# Patient Record
Sex: Male | Born: 1960 | Race: White | Hispanic: No | Marital: Married | Smoking: Current every day smoker
Health system: Southern US, Community
[De-identification: ages and names within clinical notes are randomized; demographics above are authoritative.]

## PROBLEM LIST (undated history)

## (undated) HISTORY — PX: OTHER SURGICAL HISTORY: SHX169

---

## 2016-05-06 ENCOUNTER — Ambulatory Visit
Admission: RE | Admit: 2016-05-06 | Discharge: 2016-05-06 | Disposition: A | Discharge status: Home / Self Care | Payer: BLUE CROSS/BLUE SHIELD | Source: Ambulatory Visit | Attending: Orthopedic Surgery | Admitting: Orthopedic Surgery

## 2016-05-06 ENCOUNTER — Other Ambulatory Visit: Payer: Self-pay | Admitting: Orthopedic Surgery

## 2016-05-06 DIAGNOSIS — S22089A Unspecified fracture of T11-T12 vertebra, initial encounter for closed fracture: Secondary | ICD-10-CM | POA: Diagnosis not present

## 2016-05-06 DIAGNOSIS — S22080A Wedge compression fracture of T11-T12 vertebra, initial encounter for closed fracture: Secondary | ICD-10-CM

## 2016-05-06 DIAGNOSIS — X58XXXA Exposure to other specified factors, initial encounter: Secondary | ICD-10-CM | POA: Insufficient documentation

## 2016-05-06 DIAGNOSIS — S32029A Unspecified fracture of second lumbar vertebra, initial encounter for closed fracture: Secondary | ICD-10-CM | POA: Diagnosis not present

## 2016-05-06 DIAGNOSIS — M5124 Other intervertebral disc displacement, thoracic region: Secondary | ICD-10-CM | POA: Insufficient documentation

## 2016-05-06 DIAGNOSIS — M549 Dorsalgia, unspecified: Secondary | ICD-10-CM | POA: Diagnosis present
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
## 2022-05-31 ENCOUNTER — Emergency Department: Payer: 59

## 2022-05-31 ENCOUNTER — Other Ambulatory Visit: Payer: Self-pay

## 2022-05-31 ENCOUNTER — Inpatient Hospital Stay: Payer: 59

## 2022-05-31 ENCOUNTER — Inpatient Hospital Stay
Admission: EM | Admit: 2022-05-31 | Discharge: 2022-06-03 | DRG: 603 | Disposition: A | Discharge status: Home Health | Payer: 59 | Attending: Student in an Organized Health Care Education/Training Program | Admitting: Student in an Organized Health Care Education/Training Program

## 2022-05-31 ENCOUNTER — Encounter: Payer: Self-pay | Admitting: Emergency Medicine

## 2022-05-31 DIAGNOSIS — E11621 Type 2 diabetes mellitus with foot ulcer: Secondary | ICD-10-CM | POA: Diagnosis present

## 2022-05-31 DIAGNOSIS — E1165 Type 2 diabetes mellitus with hyperglycemia: Secondary | ICD-10-CM | POA: Diagnosis present

## 2022-05-31 DIAGNOSIS — E118 Type 2 diabetes mellitus with unspecified complications: Secondary | ICD-10-CM

## 2022-05-31 DIAGNOSIS — L03115 Cellulitis of right lower limb: Secondary | ICD-10-CM | POA: Diagnosis present

## 2022-05-31 DIAGNOSIS — R21 Rash and other nonspecific skin eruption: Secondary | ICD-10-CM | POA: Diagnosis not present

## 2022-05-31 DIAGNOSIS — R233 Spontaneous ecchymoses: Secondary | ICD-10-CM | POA: Diagnosis not present

## 2022-05-31 DIAGNOSIS — I83013 Varicose veins of right lower extremity with ulcer of ankle: Secondary | ICD-10-CM | POA: Diagnosis present

## 2022-05-31 DIAGNOSIS — L97929 Non-pressure chronic ulcer of unspecified part of left lower leg with unspecified severity: Secondary | ICD-10-CM | POA: Diagnosis present

## 2022-05-31 DIAGNOSIS — I889 Nonspecific lymphadenitis, unspecified: Secondary | ICD-10-CM | POA: Diagnosis present

## 2022-05-31 DIAGNOSIS — I8392 Asymptomatic varicose veins of left lower extremity: Secondary | ICD-10-CM | POA: Diagnosis present

## 2022-05-31 DIAGNOSIS — Z23 Encounter for immunization: Secondary | ICD-10-CM

## 2022-05-31 DIAGNOSIS — L97319 Non-pressure chronic ulcer of right ankle with unspecified severity: Secondary | ICD-10-CM | POA: Diagnosis not present

## 2022-05-31 DIAGNOSIS — L97921 Non-pressure chronic ulcer of unspecified part of left lower leg limited to breakdown of skin: Secondary | ICD-10-CM | POA: Insufficient documentation

## 2022-05-31 DIAGNOSIS — I878 Other specified disorders of veins: Secondary | ICD-10-CM | POA: Diagnosis present

## 2022-05-31 DIAGNOSIS — G8929 Other chronic pain: Secondary | ICD-10-CM | POA: Diagnosis not present

## 2022-05-31 DIAGNOSIS — L299 Pruritus, unspecified: Secondary | ICD-10-CM | POA: Diagnosis present

## 2022-05-31 DIAGNOSIS — L089 Local infection of the skin and subcutaneous tissue, unspecified: Secondary | ICD-10-CM | POA: Diagnosis not present

## 2022-05-31 DIAGNOSIS — F17210 Nicotine dependence, cigarettes, uncomplicated: Secondary | ICD-10-CM | POA: Diagnosis present

## 2022-05-31 DIAGNOSIS — L0390 Cellulitis, unspecified: Secondary | ICD-10-CM | POA: Diagnosis not present

## 2022-05-31 DIAGNOSIS — L03116 Cellulitis of left lower limb: Secondary | ICD-10-CM | POA: Diagnosis not present

## 2022-05-31 DIAGNOSIS — M7989 Other specified soft tissue disorders: Secondary | ICD-10-CM | POA: Diagnosis not present

## 2022-05-31 DIAGNOSIS — R739 Hyperglycemia, unspecified: Secondary | ICD-10-CM | POA: Diagnosis not present

## 2022-05-31 DIAGNOSIS — L97311 Non-pressure chronic ulcer of right ankle limited to breakdown of skin: Secondary | ICD-10-CM | POA: Diagnosis not present

## 2022-05-31 DIAGNOSIS — I83002 Varicose veins of unspecified lower extremity with ulcer of calf: Secondary | ICD-10-CM

## 2022-05-31 DIAGNOSIS — E1169 Type 2 diabetes mellitus with other specified complication: Secondary | ICD-10-CM

## 2022-05-31 DIAGNOSIS — I776 Arteritis, unspecified: Secondary | ICD-10-CM | POA: Diagnosis not present

## 2022-05-31 DIAGNOSIS — R000 Tachycardia, unspecified: Secondary | ICD-10-CM | POA: Diagnosis present

## 2022-05-31 LAB — LACTIC ACID, PLASMA
Lactic Acid, Venous: 1.7 mmol/L (ref 0.5–1.9)
Lactic Acid, Venous: 2.3 mmol/L (ref 0.5–1.9)

## 2022-05-31 LAB — CBG MONITORING, ED: Glucose-Capillary: 114 mg/dL — ABNORMAL HIGH (ref 70–99)

## 2022-05-31 LAB — CBC WITH DIFFERENTIAL/PLATELET
Abs Immature Granulocytes: 0.01 10*3/uL (ref 0.00–0.07)
Basophils Absolute: 0.1 10*3/uL (ref 0.0–0.1)
Basophils Relative: 1 %
Eosinophils Absolute: 0.4 10*3/uL (ref 0.0–0.5)
Eosinophils Relative: 6 %
HCT: 34.3 % — ABNORMAL LOW (ref 39.0–52.0)
Hemoglobin: 11.1 g/dL — ABNORMAL LOW (ref 13.0–17.0)
Immature Granulocytes: 0 %
Lymphocytes Relative: 14 %
Lymphs Abs: 1 10*3/uL (ref 0.7–4.0)
MCH: 29.1 pg (ref 26.0–34.0)
MCHC: 32.4 g/dL (ref 30.0–36.0)
MCV: 89.8 fL (ref 80.0–100.0)
Monocytes Absolute: 0.7 10*3/uL (ref 0.1–1.0)
Monocytes Relative: 11 %
Neutro Abs: 4.7 10*3/uL (ref 1.7–7.7)
Neutrophils Relative %: 68 %
Platelets: 259 10*3/uL (ref 150–400)
RBC: 3.82 MIL/uL — ABNORMAL LOW (ref 4.22–5.81)
RDW: 13.3 % (ref 11.5–15.5)
WBC: 6.9 10*3/uL (ref 4.0–10.5)
nRBC: 0 % (ref 0.0–0.2)

## 2022-05-31 LAB — SURGICAL PCR SCREEN
MRSA, PCR: NEGATIVE
Staphylococcus aureus: POSITIVE — AB

## 2022-05-31 LAB — COMPREHENSIVE METABOLIC PANEL
ALT: 21 U/L (ref 0–44)
AST: 30 U/L (ref 15–41)
Albumin: 3.2 g/dL — ABNORMAL LOW (ref 3.5–5.0)
Alkaline Phosphatase: 116 U/L (ref 38–126)
Anion gap: 8 (ref 5–15)
BUN: 14 mg/dL (ref 6–20)
CO2: 26 mmol/L (ref 22–32)
Calcium: 8.4 mg/dL — ABNORMAL LOW (ref 8.9–10.3)
Chloride: 101 mmol/L (ref 98–111)
Creatinine, Ser: 0.74 mg/dL (ref 0.61–1.24)
GFR, Estimated: >60 mL/min (ref >60)
Glucose, Bld: 210 mg/dL — ABNORMAL HIGH (ref 70–99)
Potassium: 3.7 mmol/L (ref 3.5–5.1)
Sodium: 135 mmol/L (ref 135–145)
Total Bilirubin: 0.6 mg/dL (ref 0.3–1.2)
Total Protein: 8 g/dL (ref 6.5–8.1)

## 2022-05-31 LAB — PROTIME-INR
INR: 1.1 (ref 0.8–1.2)
Prothrombin Time: 14.5 seconds (ref 11.4–15.2)

## 2022-05-31 LAB — CBC
HCT: 36.2 % — ABNORMAL LOW (ref 39.0–52.0)
Hemoglobin: 11.6 g/dL — ABNORMAL LOW (ref 13.0–17.0)
MCH: 29.1 pg (ref 26.0–34.0)
MCHC: 32 g/dL (ref 30.0–36.0)
MCV: 91 fL (ref 80.0–100.0)
Platelets: 241 10*3/uL (ref 150–400)
RBC: 3.98 MIL/uL — ABNORMAL LOW (ref 4.22–5.81)
RDW: 13.5 % (ref 11.5–15.5)
WBC: 6 10*3/uL (ref 4.0–10.5)
nRBC: 0 % (ref 0.0–0.2)

## 2022-05-31 LAB — CREATININE, SERUM
Creatinine, Ser: 0.72 mg/dL (ref 0.61–1.24)
GFR, Estimated: >60 mL/min (ref >60)

## 2022-05-31 LAB — APTT: aPTT: 30 seconds (ref 24–36)

## 2022-05-31 MED ORDER — SODIUM CHLORIDE 0.9 % IV SOLN
2.0000 g | Freq: Once | INTRAVENOUS | Status: AC
  Administered 2022-05-31: 2 g via INTRAVENOUS
  Filled 2022-05-31: qty 12.5

## 2022-05-31 MED ORDER — WHITE PETROLATUM EX OINT: TOPICAL_OINTMENT | CUTANEOUS | Status: DC | PRN

## 2022-05-31 MED ORDER — WHITE PETROLATUM EX OINT: 1.0000 | TOPICAL_OINTMENT | Freq: Every day | CUTANEOUS | Status: DC

## 2022-05-31 MED ORDER — HYDROCODONE-ACETAMINOPHEN 5-325 MG PO TABS
2.0000 | ORAL_TABLET | ORAL | Status: DC | PRN
  Administered 2022-06-01 – 2022-06-02 (×2): 2 via ORAL
  Filled 2022-05-31 (×5): qty 2

## 2022-05-31 MED ORDER — INSULIN ASPART 100 UNIT/ML IJ SOLN
0.0000 [IU] | Freq: Three times a day (TID) | INTRAMUSCULAR | Status: DC
  Administered 2022-06-01 – 2022-06-02 (×3): 1 [IU] via SUBCUTANEOUS
  Administered 2022-06-02: 2 [IU] via SUBCUTANEOUS
  Administered 2022-06-03: 3 [IU] via SUBCUTANEOUS
  Filled 2022-05-31 (×5): qty 1

## 2022-05-31 MED ORDER — MORPHINE SULFATE (PF) 4 MG/ML IV SOLN
4.0000 mg | Freq: Once | INTRAVENOUS | Status: AC
  Administered 2022-05-31: 4 mg via INTRAVENOUS
  Filled 2022-05-31: qty 1

## 2022-05-31 MED ORDER — INSULIN ASPART 100 UNIT/ML IJ SOLN: 0.0000 [IU] | Freq: Every day | INTRAMUSCULAR | Status: DC

## 2022-05-31 MED ORDER — VANCOMYCIN HCL 1500 MG/300ML IV SOLN
1500.0000 mg | Freq: Once | INTRAVENOUS | Status: AC
  Administered 2022-05-31: 1500 mg via INTRAVENOUS
  Filled 2022-05-31: qty 300

## 2022-05-31 MED ORDER — MORPHINE SULFATE (PF) 4 MG/ML IV SOLN: 4.0000 mg | INTRAVENOUS | Status: DC | PRN

## 2022-05-31 MED ORDER — ACETAMINOPHEN 325 MG PO TABS: 650.0000 mg | ORAL_TABLET | ORAL | Status: DC | PRN

## 2022-05-31 MED ORDER — VANCOMYCIN HCL 1250 MG/250ML IV SOLN
1250.0000 mg | Freq: Two times a day (BID) | INTRAVENOUS | Status: DC
  Administered 2022-06-01 (×2): 1250 mg via INTRAVENOUS
  Filled 2022-05-31 (×3): qty 250

## 2022-05-31 MED ORDER — ENOXAPARIN SODIUM 40 MG/0.4ML IJ SOSY
40.0000 mg | PREFILLED_SYRINGE | INTRAMUSCULAR | Status: DC
  Administered 2022-05-31 – 2022-06-02 (×3): 40 mg via SUBCUTANEOUS
  Filled 2022-05-31 (×3): qty 0.4

## 2022-05-31 MED ORDER — WHITE PETROLATUM EX OINT
1.0000 | TOPICAL_OINTMENT | Freq: Every day | CUTANEOUS | Status: DC
  Administered 2022-06-01 – 2022-06-03 (×3): 1 via TOPICAL
  Filled 2022-05-31: qty 20
  Filled 2022-05-31 (×4): qty 5

## 2022-05-31 MED ORDER — CHLORHEXIDINE GLUCONATE CLOTH 2 % EX PADS
6.0000 | MEDICATED_PAD | Freq: Every day | CUTANEOUS | Status: DC
  Administered 2022-06-01 – 2022-06-03 (×3): 6 via TOPICAL

## 2022-05-31 MED ORDER — MUPIROCIN 2 % EX OINT
1.0000 | TOPICAL_OINTMENT | Freq: Two times a day (BID) | CUTANEOUS | Status: DC
  Administered 2022-05-31 – 2022-06-03 (×6): 1 via NASAL
  Filled 2022-05-31: qty 22

## 2022-05-31 MED ORDER — PIPERACILLIN-TAZOBACTAM 3.375 G IVPB
3.3750 g | Freq: Three times a day (TID) | INTRAVENOUS | Status: DC
  Administered 2022-05-31 – 2022-06-02 (×5): 3.375 g via INTRAVENOUS
  Filled 2022-05-31 (×5): qty 50

## 2022-05-31 MED ORDER — TETANUS-DIPHTH-ACELL PERTUSSIS 5-2.5-18.5 LF-MCG/0.5 IM SUSY
0.5000 mL | PREFILLED_SYRINGE | Freq: Once | INTRAMUSCULAR | Status: AC
  Administered 2022-05-31: 0.5 mL via INTRAMUSCULAR
  Filled 2022-05-31: qty 0.5

## 2022-05-31 MED ORDER — SODIUM CHLORIDE 0.9% FLUSH
3.0000 mL | Freq: Two times a day (BID) | INTRAVENOUS | Status: DC
  Administered 2022-05-31 – 2022-06-03 (×5): 3 mL via INTRAVENOUS

## 2022-05-31 MED ORDER — LACTATED RINGERS IV BOLUS
1000.0000 mL | Freq: Once | INTRAVENOUS | Status: AC
  Administered 2022-05-31: 1000 mL via INTRAVENOUS

## 2022-05-31 MED ORDER — WHITE PETROLATUM EX OINT
1.0000 | TOPICAL_OINTMENT | Freq: Every day | CUTANEOUS | Status: DC
  Filled 2022-05-31: qty 5

## 2022-05-31 MED ORDER — POLYETHYLENE GLYCOL 3350 17 G PO PACK
17.0000 g | PACK | Freq: Every day | ORAL | Status: DC
  Administered 2022-05-31 – 2022-06-03 (×4): 17 g via ORAL
  Filled 2022-05-31 (×4): qty 1

## 2022-05-31 NOTE — ED Triage Notes (Signed)
Pt reports bilateral leg wounds that he has been dealing with since the beginning of the year. Left leg worse. Pt states it started as a small spot and his fingernail scratched it. Leg swollen and red.

## 2022-05-31 NOTE — Assessment & Plan Note (Signed)
Involving the left lower extremity associated with some purulence up to the left tibia from the ankle area with some involvement of the left foot dorsum.  Given association with exposure to dog saliva, patient will need both vancomycin and Zosyn.  Monitor clinically.

## 2022-05-31 NOTE — Assessment & Plan Note (Signed)
Likely due to above monitor

## 2022-05-31 NOTE — Assessment & Plan Note (Signed)
Macular rash, not associated with any eosinophilia.  Associated with some itching.  I will check acute hepatitis panel to try to rule out hepatitis C as well as hepatitis B.  I will check HIV.  Monitor

## 2022-05-31 NOTE — Assessment & Plan Note (Signed)
Patient reports ulcer present for about 6 weeks, will need x-ray to make sure there is no bony erosion underlying this.  I have requested wound care evaluation, for tonight cover with Vaseline gauze and secure with Kerlix.  Does not appear to be infected at this time

## 2022-05-31 NOTE — ED Provider Notes (Signed)
[facility] Provider Note    Event Date/Time   First MD Initiated Contact with Patient 05/31/22 1800     (approximate)   History   leg infection   HPI  [name] is a 62 y.o. male reports no major past medical history no allergies takes no medications ago occasional ibuprofen  He had a spot that he reports started like a varicose vein that popped on his left lower leg in January and progressively worsened with increasing redness swelling over his leg.  Reports that actually worse looked worse yesterday.  Wife encouraged him to come to the ER to have this looked at as it has been festering for months  Reports pain in his left leg ibuprofen "does not really help much".  Denies history of diabetes.  No fevers.  Reports the left leg does ache there is been no injury     Physical Exam   Triage Vital Signs: ED Triage Vitals  Enc Vitals Group     BP 05/31/22 1717 (!) 161/95     Pulse Rate 05/31/22 1717 (!) 125     Resp 05/31/22 1717 18     Temp 05/31/22 1717 98.1 F (36.7 C)     Temp Source 05/31/22 1717 Oral     SpO2 05/31/22 1717 96 %     Weight 05/31/22 1718 185 lb (83.9 kg)     Height 05/31/22 1718 '5\' 9"'$  (1.753 m)     Head Circumference --      Peak Flow --      Pain Score 05/31/22 1717 2     Pain Loc --      Pain Edu? --      Excl. in Trail? --     Most recent vital signs: Vitals:   05/31/22 1717 05/31/22 1907  BP: (!) 161/95 139/87  Pulse: (!) 125 (!) 103  Resp: 18   Temp: 98.1 F (36.7 C)   SpO2: 96% 100%     General: Awake, no distress.  CV:  Good peripheral perfusion.  Palpable dorsalis pedis pulse left foot warm and well-perfused toes left foot Resp:  Normal effort.  Abd:  No distention.  Other:  See clinical media uploaded.  Right lower extremity no noted lesion or injury.  Left lower extremity with significant cobblestoning, some ulceration and drainage erythema primarily involving the left calf medial posterior surfaces with  surrounding erythema and slight edema.  Warm to touch.   ED Results / Procedures / Treatments   Labs (all labs ordered are listed, but only abnormal results are displayed) Labs Reviewed  COMPREHENSIVE METABOLIC PANEL - Abnormal; Notable for the following components:      Result Value   Glucose, Bld 210 (*)    Calcium 8.4 (*)    Albumin 3.2 (*)    All other components within normal limits  CBC WITH DIFFERENTIAL/PLATELET - Abnormal; Notable for the following components:   RBC 3.82 (*)    Hemoglobin 11.1 (*)    HCT 34.3 (*)    All other components within normal limits  CULTURE, BLOOD (ROUTINE X 2)  CULTURE, BLOOD (ROUTINE X 2)  SURGICAL PCR SCREEN  LACTIC ACID, PLASMA  LACTIC ACID, PLASMA     EKG     RADIOLOGY  Personally interpreted the patient's left tib-fib x-ray is negative for fracture, no obvious gas formation  US Venous Img Lower Unilateral Left  Result Date: 05/31/2022 CLINICAL DATA:  Left leg swelling. EXAM: LEFT LOWER EXTREMITY VENOUS  DOPPLER ULTRASOUND TECHNIQUE: Gray-scale sonography with compression, as well as color and duplex ultrasound, were performed to evaluate the deep venous system(s) from the level of the common femoral vein through the popliteal and proximal calf veins. COMPARISON:  None Available. FINDINGS: VENOUS Normal compressibility of the common femoral, superficial femoral, and popliteal veins, as well as the visualized calf veins. Visualized portions of profunda femoral vein and great saphenous vein unremarkable. No filling defects to suggest DVT on grayscale or color Doppler imaging. Doppler waveforms show normal direction of venous flow, normal respiratory plasticity and response to augmentation. Limited views of the contralateral common femoral vein are unremarkable. OTHER Bilateral inguinal lymph nodes, the larger on the left measuring 4.4 x 1.5 x 3.8 cm and on the right measuring 2.5 x 1.1 x 1.3 cm. Limitations: none IMPRESSION: 1. No lower  extremity DVT. 2. Bilateral inguinal lymph nodes, the larger on the left measuring 4.4 x 1.5 x 3.8 cm and on the right measuring 2.5 x 1.1 x 1.3 cm. Negative. Electronically Signed   By: Keane Police D.O.   On: 05/31/2022 20:01   DG Tibia/Fibula Left  Result Date: 05/31/2022 CLINICAL DATA:  Left leg infection EXAM: LEFT TIBIA AND FIBULA - 2 VIEW COMPARISON:  None Available. FINDINGS: There is no evidence of fracture or other focal bone lesions. No bone destruction. Soft tissues are unremarkable. No radiopaque foreign body. IMPRESSION: Negative. Electronically Signed   By: Rolm Baptise M.D.   On: 05/31/2022 19:22    Left lower extremity negative for acute DVT.  Bilateral large lymph nodes are noted  Left tib-fib x-ray negative  PROCEDURES:  Critical Care performed: No  Procedures   MEDICATIONS ORDERED IN ED: Medications  lactated ringers bolus 1,000 mL (has no administration in time range)  morphine (PF) 4 MG/ML injection 4 mg (has no administration in time range)     IMPRESSION / MDM / ASSESSMENT AND PLAN / ED COURSE  I reviewed the triage vital signs and the nursing notes.                              Differential diagnosis includes, but is not limited to, cellulitis, deep space infection, necrotizing processes though no purpura or obvious necrotic lesions are noted and there is no noted crepitance, new onset diabetes etc.  Patient appears to have an obvious severe cellulitic appearance to the left leg.  Will start on broad-spectrum antibiotic with vancomycin and cefepime.  Pain medication with morphine.  Imaging to exclude underlying DVT and also any subcutaneous emphysema though based on the chronicity and exam findings I do not think there is a significant risk of an acute necrotizing process at this time.  The patient does not meet sepsis criteria.  He does have tachycardia, but no elevated white count no tachypnea tachypnea, no fever  Appears consistent with severe cellulitis  left lower extremity.  Discussed with patient he is understanding agreeable with plan for admission.  Placed a consult to our wound team as well and started antibiotic therapy at this time  Patient's presentation is most consistent with acute complicated illness / injury requiring diagnostic workup.  ----------------------------------------- 8:42 PM on 05/31/2022 ----------------------------------------- Patient understanding, agreeable with plan for admission and antibiotic therapy.  Consulted with her hospitalist patient accepted to the service of Dr. Earnestine Mealing     FINAL CLINICAL IMPRESSION(S) / ED DIAGNOSES   Final diagnoses:  Hyperglycemia  Cellulitis of left leg  Rx / DC Orders   ED Discharge Orders     None        Note:  This document was prepared using Dragon voice recognition software and may include unintentional dictation errors.   Delman Kitten, MD 05/31/22 2042

## 2022-05-31 NOTE — H&P (Signed)
History and Physical    Patient: [name] [id number] DOB: Nov 23, 1960 DOA: 05/31/2022 DOS: the patient was seen and examined on 05/31/2022 PCP: Patient, No Pcp Per  Patient coming from: Home  Chief Complaint:  Chief Complaint  Patient presents with   leg infection   HPI: [name] is a 62 y.o. male with shares no significant medical history.  Patient reports being in his usual state of health till about 8 weeks ago.  Since about that time patient reports several areas of small disruptions of skin on his feet.  About 2 weeks ago he states he picked on spot of skin disruption near the left medial malleolus.  Subsequently the area developed redness that spread proximally.  Patient has had about 2 weeks of erythema of left lower leg.  With associated new ulcers developing on the leg.  At the same time he has continued to have other minor skin scrapes/carts in bilateral feet.  Patient also reports erythematous macular eruption pretty much all over his body but involving mostly his all 4 extremities in the last 8 weeks associated with intense pruritus.  He denies any fever nausea vomiting diarrhea he denies any new exposures such as use of detergent toothpaste food ingredients there has been no change in patient's lifestyle.  Patient denies any marine exposures denies any dog bites, however since he does have ulcerations on his legs one of his dogs is able to reach the wounds.  And lick it although not bite it.  Patient came to the ER due to severe pain in his left feet/leg.  Medical evaluation is sought wife is at the bedside Review of Systems: As mentioned in the history of present illness. All other systems reviewed and are negative. No past medical history on file. Social History:  reports that he has been smoking cigarettes. He has never used smokeless tobacco. No history on file for alcohol use and drug use.  Not on File  No family history on file.  Prior to Admission medications   Not on  File    Physical Exam: Vitals:   05/31/22 2000 05/31/22 2030 05/31/22 2100 05/31/22 2109  BP: (!) 149/99 (!) 153/99 (!) 170/90   Pulse: 100 94 95   Resp: 15 20 (!) 26   Temp:    97.7 F (36.5 C)  TempSrc:    Oral  SpO2: 99% 100% 100%   Weight:      Height:      See media section for photographic documentation of exam.  Patient is in no distress, coherent Extremities patient moves all extremities there seem to be no joint involvement by an infectious process Cardiovascular exam S1-S2 normal Respiratory exam bilateral air entry vesicular Abdomen soft nontender Skin exam: Patient has obvious erythema that involves I believe the entirety of the left lower leg up to the tibia.  The leg has associated irregular shaped shallow ulcerations.  The ankle movement is free there is some erythema of the dorsum of the foot as well.  There is scant purulence noted in the left shin.  No obvious abscesses noted On the right foot there are multiple almost a millimeter wide scrapes/cuts on the skin on the dorsum on the toes of the patient.  The lateral malleolus on the right has about a 2 cm x 1 cm ulcer that seems to be deep to the fascia.  There is also a millimeter puncture like ulcer on the medial malleolus on the right side.  There is no  associated spreading erythema.  However patient reports tenderness at the sites. The lateral malleolar ulcer is reportedly present for 6 weeks. There are multiple faint macules about 2 mm to 3 mm all over the patient's skin involving all 4 extremities and also anterior chest and abdomen.  Some of these seem to have been scratched over with associated scab/punctate clots on them no oral ulceration.  Patient denies any genital discharge  Data Reviewed:    Labs on Admission:  Results for orders placed or performed during the hospital encounter of 05/31/22 (from the past 24 hour(s))  Lactic acid, plasma     Status: None   Collection Time: 05/31/22  5:22 PM  Result  Value Ref Range   Lactic Acid, Venous 1.7 0.5 - 1.9 mmol/L  Comprehensive metabolic panel     Status: Abnormal   Collection Time: 05/31/22  5:22 PM  Result Value Ref Range   Sodium 135 135 - 145 mmol/L   Potassium 3.7 3.5 - 5.1 mmol/L   Chloride 101 98 - 111 mmol/L   CO2 26 22 - 32 mmol/L   Glucose, Bld 210 (H) 70 - 99 mg/dL   BUN 14 6 - 20 mg/dL   Creatinine, Ser 0.74 0.61 - 1.24 mg/dL   Calcium 8.4 (L) 8.9 - 10.3 mg/dL   Total Protein 8.0 6.5 - 8.1 g/dL   Albumin 3.2 (L) 3.5 - 5.0 g/dL   AST 30 15 - 41 U/L   ALT 21 0 - 44 U/L   Alkaline Phosphatase 116 38 - 126 U/L   Total Bilirubin 0.6 0.3 - 1.2 mg/dL   GFR, Estimated >60 >60 mL/min   Anion gap 8 5 - 15  CBC with Differential     Status: Abnormal   Collection Time: 05/31/22  5:22 PM  Result Value Ref Range   WBC 6.9 4.0 - 10.5 K/uL   RBC 3.82 (L) 4.22 - 5.81 MIL/uL   Hemoglobin 11.1 (L) 13.0 - 17.0 g/dL   HCT 34.3 (L) 39.0 - 52.0 %   MCV 89.8 80.0 - 100.0 fL   MCH 29.1 26.0 - 34.0 pg   MCHC 32.4 30.0 - 36.0 g/dL   RDW 13.3 11.5 - 15.5 %   Platelets 259 150 - 400 K/uL   nRBC 0.0 0.0 - 0.2 %   Neutrophils Relative % 68 %   Neutro Abs 4.7 1.7 - 7.7 K/uL   Lymphocytes Relative 14 %   Lymphs Abs 1.0 0.7 - 4.0 K/uL   Monocytes Relative 11 %   Monocytes Absolute 0.7 0.1 - 1.0 K/uL   Eosinophils Relative 6 %   Eosinophils Absolute 0.4 0.0 - 0.5 K/uL   Basophils Relative 1 %   Basophils Absolute 0.1 0.0 - 0.1 K/uL   Immature Granulocytes 0 %   Abs Immature Granulocytes 0.01 0.00 - 0.07 K/uL   Basic Metabolic Panel:  Liver Function Tests:  No results for input(s): "LIPASE", "AMYLASE" in the last 168 hours. No results for input(s): "AMMONIA" in the last 168 hours. CBC:  Cardiac Enzymes: No results for input(s): "CKTOTAL", "CKMB", "CKMBINDEX", "TROPONINIHS" in the last 168 hours.  BNP (last 3 results) No results for input(s): "PROBNP" in the last 8760 hours. CBG: No results for input(s): "GLUCAP" in the last 168  hours.  Radiological Exams on Admission:  US Venous Img Lower Unilateral Left  Result Date: 05/31/2022 CLINICAL DATA:  Left leg swelling. EXAM: LEFT LOWER EXTREMITY VENOUS DOPPLER ULTRASOUND TECHNIQUE: Gray-scale sonography with compression, as well  as color and duplex ultrasound, were performed to evaluate the deep venous system(s) from the level of the common femoral vein through the popliteal and proximal calf veins. COMPARISON:  None Available. FINDINGS: VENOUS Normal compressibility of the common femoral, superficial femoral, and popliteal veins, as well as the visualized calf veins. Visualized portions of profunda femoral vein and great saphenous vein unremarkable. No filling defects to suggest DVT on grayscale or color Doppler imaging. Doppler waveforms show normal direction of venous flow, normal respiratory plasticity and response to augmentation. Limited views of the contralateral common femoral vein are unremarkable. OTHER Bilateral inguinal lymph nodes, the larger on the left measuring 4.4 x 1.5 x 3.8 cm and on the right measuring 2.5 x 1.1 x 1.3 cm. Limitations: none IMPRESSION: 1. No lower extremity DVT. 2. Bilateral inguinal lymph nodes, the larger on the left measuring 4.4 x 1.5 x 3.8 cm and on the right measuring 2.5 x 1.1 x 1.3 cm. Negative. Electronically Signed   By: Keane Police D.O.   On: 05/31/2022 20:01   DG Tibia/Fibula Left  Result Date: 05/31/2022 CLINICAL DATA:  Left leg infection EXAM: LEFT TIBIA AND FIBULA - 2 VIEW COMPARISON:  None Available. FINDINGS: There is no evidence of fracture or other focal bone lesions. No bone destruction. Soft tissues are unremarkable. No radiopaque foreign body. IMPRESSION: Negative. Electronically Signed   By: Rolm Baptise M.D.   On: 05/31/2022 19:22            Assessment and Plan: * Cellulitis Involving the left lower extremity associated with some purulence up to the left tibia from the ankle area with some involvement of the left  foot dorsum.  Given association with exposure to dog saliva, patient will need both vancomycin and Zosyn.  Monitor clinically.  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  I will treat the patient with moisturizing cream application.  Will see what the hemoglobin A1c shows and thereafter proceed accordingly for footcare  Inguinal lymphadenitis Likely due to above monitor  Hyperglycemia Check hemoglobin A1c, start insulin  Ulcer of ankle, right, limited to breakdown of skin ([place]) Patient reports ulcer present for about 6 weeks, will need x-ray to make sure there is no bony erosion underlying this.  I have requested wound care evaluation, for tonight cover with Vaseline gauze and secure with Kerlix.  Does not appear to be infected at this time  Rash Macular rash, not associated with any eosinophilia.  Associated with some itching.  I will check acute hepatitis panel to try to rule out hepatitis C as well as hepatitis B.  I will check HIV.  Monitor   DVT prophylaxis has been ordered, check INR PTT   Advance Care Planning:   Code Status: Not on file full code  Consults: Let see what the above workup shows, will order consults accordingly.  Wound care has been requested  Family Communication: Wife was at the bedside for this encounter  Severity of Illness: The appropriate patient status for this patient is INPATIENT. Inpatient status is judged to be reasonable and necessary in order to provide the required intensity of service to ensure the patient's safety. The patient's presenting symptoms, physical exam findings, and initial radiographic and laboratory data in the context of their chronic comorbidities is felt to place them at high risk for further clinical deterioration. Furthermore, it is not anticipated that the patient will be medically stable  for discharge from the hospital within 2 midnights of  admission.   * I certify that at the point of admission it is my clinical judgment that the patient will require inpatient hospital care spanning beyond 2 midnights from the point of admission due to high intensity of service, high risk for further deterioration and high frequency of surveillance required.*  Author: Gertie Fey, MD 05/31/2022 9:39 PM  For on call review [email or web address].

## 2022-05-31 NOTE — Assessment & Plan Note (Signed)
I strongly suspect patient has a diabetic foot that is causing his skin to be dry, cracking/easily ulcerated.  I will treat the patient with moisturizing cream application.  Will see what the hemoglobin A1c shows and thereafter proceed accordingly for footcare

## 2022-05-31 NOTE — Progress Notes (Signed)
Pharmacy Antibiotic Note  [name] is a 62 y.o. male admitted on 05/31/2022 with cellulitis from contaminated dog saliva.  Pharmacy has been consulted for Vancomycin, Zosyn dosing.  Plan: Zosyn 3.375g IV q8h (4 hour infusion).  Vancomycin 1500 mg IV X 1 given in ED on 3/2 @ 2042. Vancomycin 1250 mg IV Q12H ordered to start on 3/3 @ 0900.  AUC = 476.1 Vanc trough = 12.8   Height: '5\' 9"'$  (175.3 cm) Weight: 83.9 kg (185 lb) IBW/kg (Calculated) : 70.7  Temp (24hrs), Avg:97.9 F (36.6 C), Min:97.7 F (36.5 C), Max:98.1 F (36.7 C)  Recent Labs  Lab 05/31/22 1722  WBC 6.9  CREATININE 0.74  LATICACIDVEN 1.7    Estimated Creatinine Clearance: 99.4 mL/min (by C-G formula based on SCr of 0.74 mg/dL).    Not on File  Antimicrobials this admission:   >>    >>   Dose adjustments this admission:   Microbiology results:  BCx:   UCx:    Sputum:    MRSA PCR:   Thank you for allowing pharmacy to be a part of this patient's care.  Khalani Novoa D 05/31/2022 9:54 PM

## 2022-05-31 NOTE — Assessment & Plan Note (Signed)
Check hemoglobin A1c, start insulin

## 2022-05-31 NOTE — Consult Note (Signed)
PHARMACY -  BRIEF ANTIBIOTIC NOTE   Pharmacy has received consult(s) for vancomycin and cefepime from an ED provider.  The patient's profile has been reviewed for ht/wt/allergies/indication/available labs.    One time order(s) placed for Vancomycin 1500 mg and Cefepime 2 g   Further antibiotics/pharmacy consults should be ordered by admitting physician if indicated.                       Thank you, Vick Frees, PharmD  05/31/2022  7:32 PM

## 2022-06-01 ENCOUNTER — Inpatient Hospital Stay: Payer: 59

## 2022-06-01 DIAGNOSIS — E1169 Type 2 diabetes mellitus with other specified complication: Secondary | ICD-10-CM

## 2022-06-01 DIAGNOSIS — L03116 Cellulitis of left lower limb: Secondary | ICD-10-CM

## 2022-06-01 DIAGNOSIS — L97202 Non-pressure chronic ulcer of unspecified calf with fat layer exposed: Secondary | ICD-10-CM

## 2022-06-01 DIAGNOSIS — R739 Hyperglycemia, unspecified: Secondary | ICD-10-CM

## 2022-06-01 DIAGNOSIS — I83002 Varicose veins of unspecified lower extremity with ulcer of calf: Secondary | ICD-10-CM

## 2022-06-01 DIAGNOSIS — I776 Arteritis, unspecified: Secondary | ICD-10-CM

## 2022-06-01 LAB — GLUCOSE, CAPILLARY
Glucose-Capillary: 145 mg/dL — ABNORMAL HIGH (ref 70–99)
Glucose-Capillary: 141 mg/dL — ABNORMAL HIGH (ref 70–99)
Glucose-Capillary: 106 mg/dL — ABNORMAL HIGH (ref 70–99)
Glucose-Capillary: 126 mg/dL — ABNORMAL HIGH (ref 70–99)

## 2022-06-01 LAB — BASIC METABOLIC PANEL
Anion gap: 7 (ref 5–15)
BUN: 10 mg/dL (ref 6–20)
CO2: 25 mmol/L (ref 22–32)
Calcium: 8.3 mg/dL — ABNORMAL LOW (ref 8.9–10.3)
Chloride: 101 mmol/L (ref 98–111)
Creatinine, Ser: 0.7 mg/dL (ref 0.61–1.24)
GFR, Estimated: >60 mL/min (ref >60)
Glucose, Bld: 122 mg/dL — ABNORMAL HIGH (ref 70–99)
Potassium: 3.7 mmol/L (ref 3.5–5.1)
Sodium: 133 mmol/L — ABNORMAL LOW (ref 135–145)

## 2022-06-01 LAB — HEPATITIS PANEL, ACUTE
HCV Ab: NONREACTIVE
Hep A IgM: NONREACTIVE
Hep B C IgM: NONREACTIVE
Hepatitis B Surface Ag: NONREACTIVE

## 2022-06-01 LAB — IRON AND TIBC
Iron: 58 ug/dL (ref 45–182)
Saturation Ratios: 20 % (ref 17.9–39.5)
TIBC: 293 ug/dL (ref 250–450)
UIBC: 235 ug/dL

## 2022-06-01 LAB — C-REACTIVE PROTEIN: CRP: 6.7 mg/dL — ABNORMAL HIGH (ref <1.0)

## 2022-06-01 LAB — RETICULOCYTES
Immature Retic Fract: 19.3 % — ABNORMAL HIGH (ref 2.3–15.9)
RBC.: 3.82 MIL/uL — ABNORMAL LOW (ref 4.22–5.81)
Retic Count, Absolute: 68 10*3/uL (ref 19.0–186.0)
Retic Ct Pct: 1.8 % (ref 0.4–3.1)

## 2022-06-01 LAB — CBC
HCT: 33.7 % — ABNORMAL LOW (ref 39.0–52.0)
Hemoglobin: 11 g/dL — ABNORMAL LOW (ref 13.0–17.0)
MCH: 29.1 pg (ref 26.0–34.0)
MCHC: 32.6 g/dL (ref 30.0–36.0)
MCV: 89.2 fL (ref 80.0–100.0)
Platelets: 243 10*3/uL (ref 150–400)
RBC: 3.78 MIL/uL — ABNORMAL LOW (ref 4.22–5.81)
RDW: 13.5 % (ref 11.5–15.5)
WBC: 7.1 10*3/uL (ref 4.0–10.5)
nRBC: 0 % (ref 0.0–0.2)

## 2022-06-01 LAB — FOLATE: Folate: 13.5 ng/mL (ref >5.9)

## 2022-06-01 LAB — LACTIC ACID, PLASMA: Lactic Acid, Venous: 2.2 mmol/L (ref 0.5–1.9)

## 2022-06-01 LAB — SEDIMENTATION RATE: Sed Rate: 60 mm/hr — ABNORMAL HIGH (ref 0–20)

## 2022-06-01 LAB — FERRITIN: Ferritin: 88 ng/mL (ref 24–336)

## 2022-06-01 LAB — VITAMIN B12: Vitamin B-12: 237 pg/mL (ref 180–914)

## 2022-06-01 MED ORDER — HYDROXYZINE HCL 10 MG PO TABS
10.0000 mg | ORAL_TABLET | Freq: Three times a day (TID) | ORAL | Status: DC
  Administered 2022-06-01 – 2022-06-03 (×4): 10 mg via ORAL
  Filled 2022-06-01 (×6): qty 1

## 2022-06-01 MED ORDER — TRIAMCINOLONE ACETONIDE 0.1 % EX CREA
TOPICAL_CREAM | Freq: Every day | CUTANEOUS | Status: DC
  Administered 2022-06-03: 1 via TOPICAL
  Filled 2022-06-01: qty 15

## 2022-06-01 NOTE — Plan of Care (Signed)

## 2022-06-01 NOTE — Consult Note (Signed)
Reason for Consult:*** Referring Physician: ***  [name] is an 62 y.o. male.  HPI: ***  History reviewed. No pertinent past medical history.  History reviewed. No pertinent surgical history.  History reviewed. No pertinent family history.  Social History:  reports that he has been smoking cigarettes. He has never used smokeless tobacco. He reports current alcohol use. No history on file for drug use.  Allergies: Not on File  Medications: {medication reviewed/display:3041432}  Results for orders placed or performed during the hospital encounter of 05/31/22 (from the past 48 hour(s))  Lactic acid, plasma     Status: None   Collection Time: 05/31/22  5:22 PM  Result Value Ref Range   Lactic Acid, Venous 1.7 0.5 - 1.9 mmol/L    Comment: Performed at [facility], [address] [place], [place] [address]  Comprehensive metabolic panel     Status: Abnormal   Collection Time: 05/31/22  5:22 PM  Result Value Ref Range   Sodium 135 135 - 145 mmol/L   Potassium 3.7 3.5 - 5.1 mmol/L   Chloride 101 98 - 111 mmol/L   CO2 26 22 - 32 mmol/L   Glucose, Bld 210 (H) 70 - 99 mg/dL    Comment: Glucose reference range applies only to samples taken after fasting for at least 8 hours.   BUN 14 6 - 20 mg/dL   Creatinine, Ser 0.74 0.61 - 1.24 mg/dL   Calcium 8.4 (L) 8.9 - 10.3 mg/dL   Total Protein 8.0 6.5 - 8.1 g/dL   Albumin 3.2 (L) 3.5 - 5.0 g/dL   AST 30 15 - 41 U/L   ALT 21 0 - 44 U/L   Alkaline Phosphatase 116 38 - 126 U/L   Total Bilirubin 0.6 0.3 - 1.2 mg/dL   GFR, Estimated >60 >60 mL/min    Comment: (NOTE) Calculated using the CKD-EPI Creatinine Equation (2021)    Anion gap 8 5 - 15    Comment: Performed at [facility], [address] [place], [place] [address]  CBC with Differential     Status: Abnormal   Collection Time: 05/31/22  5:22 PM  Result Value Ref Range   WBC 6.9 4.0 - 10.5 K/uL   RBC 3.82 (L) 4.22 - 5.81 MIL/uL   Hemoglobin 11.1 (L) 13.0  - 17.0 g/dL   HCT 34.3 (L) 39.0 - 52.0 %   MCV 89.8 80.0 - 100.0 fL   MCH 29.1 26.0 - 34.0 pg   MCHC 32.4 30.0 - 36.0 g/dL   RDW 13.3 11.5 - 15.5 %   Platelets 259 150 - 400 K/uL   nRBC 0.0 0.0 - 0.2 %   Neutrophils Relative % 68 %   Neutro Abs 4.7 1.7 - 7.7 K/uL   Lymphocytes Relative 14 %   Lymphs Abs 1.0 0.7 - 4.0 K/uL   Monocytes Relative 11 %   Monocytes Absolute 0.7 0.1 - 1.0 K/uL   Eosinophils Relative 6 %   Eosinophils Absolute 0.4 0.0 - 0.5 K/uL   Basophils Relative 1 %   Basophils Absolute 0.1 0.0 - 0.1 K/uL   Immature Granulocytes 0 %   Abs Immature Granulocytes 0.01 0.00 - 0.07 K/uL    Comment: Performed at [facility], [address] [place],  [address]  Blood culture (routine x 2)     Status: None (Preliminary result)   Collection Time: 05/31/22  5:22 PM   Specimen: BLOOD  Result Value Ref Range   Specimen  Description BLOOD LEFT ANTECUBITAL    Special Requests      BOTTLES DRAWN AEROBIC AND ANAEROBIC Blood Culture adequate volume   Culture      NO GROWTH < 12 HOURS Performed at [facility], [address] [place], [place] [address]    Report Status PENDING   Blood culture (routine x 2)     Status: None (Preliminary result)   Collection Time: 05/31/22  7:38 PM   Specimen: BLOOD  Result Value Ref Range   Specimen Description BLOOD RIGHT ANTECUBITAL    Special Requests      BOTTLES DRAWN AEROBIC AND ANAEROBIC Blood Culture adequate volume   Culture      NO GROWTH < 12 HOURS Performed at [facility], [address], [place], [place] [address]    Report Status PENDING   Surgical pcr screen     Status: Abnormal   Collection Time: 05/31/22  7:38 PM   Specimen: Nasal Mucosa; Nasal Swab  Result Value Ref Range   MRSA, PCR NEGATIVE NEGATIVE   Staphylococcus aureus POSITIVE (A) NEGATIVE    Comment: (NOTE) The Xpert SA Assay (FDA approved for NASAL specimens in patients 31 years of age and older), is one component of a  comprehensive surveillance program. It is not intended to diagnose infection nor to guide or monitor treatment. Performed at [facility], [address] [place], [place] [address]   CBG monitoring, ED     Status: Abnormal   Collection Time: 05/31/22 10:03 PM  Result Value Ref Range   Glucose-Capillary 114 (H) 70 - 99 mg/dL    Comment: Glucose reference range applies only to samples taken after fasting for at least 8 hours.  Lactic acid, plasma     Status: Abnormal   Collection Time: 05/31/22 10:54 PM  Result Value Ref Range   Lactic Acid, Venous 2.3 (HH) 0.5 - 1.9 mmol/L    Comment: CRITICAL RESULT CALLED TO, READ BACK BY AND VERIFIED WITH VANESSA MCWHITE AT 2354 05/31/2022 DLB Performed at [facility], [address] [place], [place] [address]   Protime-INR     Status: None   Collection Time: 05/31/22 10:54 PM  Result Value Ref Range   Prothrombin Time 14.5 11.4 - 15.2 seconds   INR 1.1 0.8 - 1.2    Comment: (NOTE) INR goal varies based on device and disease states. Performed at [facility], [address] [place], [place] [address]   APTT     Status: None   Collection Time: 05/31/22 10:54 PM  Result Value Ref Range   aPTT 30 24 - 36 seconds    Comment: Performed at [facility], [address] [place], [place] [address]  CBC     Status: Abnormal   Collection Time: 05/31/22 10:54 PM  Result Value Ref Range   WBC 6.0 4.0 - 10.5 K/uL   RBC 3.98 (L) 4.22 - 5.81 MIL/uL   Hemoglobin 11.6 (L) 13.0 - 17.0 g/dL   HCT 36.2 (L) 39.0 - 52.0 %   MCV 91.0 80.0 - 100.0 fL   MCH 29.1 26.0 - 34.0 pg   MCHC 32.0 30.0 - 36.0 g/dL   RDW 13.5 11.5 - 15.5 %   Platelets 241 150 - 400 K/uL   nRBC 0.0 0.0 - 0.2 %    Comment: Performed at [facility], [address] [place], [place] [address]  Creatinine, serum     Status: None   Collection Time: 05/31/22  10:54 PM  Result Value Ref Range   Creatinine, Ser 0.72 0.61 - 1.24 mg/dL    GFR, Estimated >60 >60 mL/min    Comment: (NOTE) Calculated using the CKD-EPI Creatinine Equation (2021) Performed at [facility], [address] [place], [place] [address]   Lactic acid, plasma     Status: Abnormal   Collection Time: 06/01/22  1:54 AM  Result Value Ref Range   Lactic Acid, Venous 2.2 (HH) 0.5 - 1.9 mmol/L    Comment: CRITICAL VALUE NOTED. VALUE IS CONSISTENT WITH PREVIOUSLY REPORTED/CALLED VALUE DLB Performed at [facility], [address] [place], [place] [address]   Ferritin     Status: None   Collection Time: 06/01/22  5:32 AM  Result Value Ref Range   Ferritin 88 24 - 336 ng/mL    Comment: Performed at [facility], [address] [place], [place] [address]  Iron and TIBC     Status: None   Collection Time: 06/01/22  5:32 AM  Result Value Ref Range   Iron 58 45 - 182 ug/dL   TIBC 293 250 - 450 ug/dL   Saturation Ratios 20 17.9 - 39.5 %   UIBC 235 ug/dL    Comment: Performed at [facility], [address], [place], [place] [address]  Folate     Status: None   Collection Time: 06/01/22  5:32 AM  Result Value Ref Range   Folate 13.5 >5.9 ng/mL    Comment: Performed at [facility], [address] [place], [place] [address]  Vitamin B12     Status: None   Collection Time: 06/01/22  5:32 AM  Result Value Ref Range   Vitamin B-12 237 180 - 914 pg/mL    Comment: (NOTE) This assay is not validated for testing neonatal or myeloproliferative syndrome specimens for Vitamin B12 levels. Performed at [facility], [place] [address] [place], [place] [address]   Reticulocytes     Status: Abnormal   Collection Time: 06/01/22  5:32 AM  Result Value Ref Range   Retic Ct Pct 1.8 0.4 - 3.1 %   RBC. 3.82 (L) 4.22 - 5.81 MIL/uL   Retic Count, Absolute 68.0 19.0 - 186.0 K/uL   Immature Retic Fract 19.3 (H) 2.3 - 15.9 %    Comment: Performed at [facility], [address] [place], [place] [id number]   Basic metabolic panel     Status: Abnormal   Collection Time: 06/01/22  5:32 AM  Result Value Ref Range   Sodium 133 (L) 135 - 145 mmol/L   Potassium 3.7 3.5 - 5.1 mmol/L   Chloride 101 98 - 111 mmol/L   CO2 25 22 - 32 mmol/L   Glucose, Bld 122 (H) 70 - 99 mg/dL    Comment: Glucose reference range applies only to samples taken after fasting for at least 8 hours.   BUN 10 6 - 20 mg/dL   Creatinine, Ser 0.70 0.61 - 1.24 mg/dL   Calcium 8.3 (L) 8.9 - 10.3 mg/dL   GFR, Estimated >60 >60 mL/min    Comment: (NOTE) Calculated using the CKD-EPI Creatinine Equation (2021)    Anion gap 7 5 - 15    Comment: Performed at [facility], [address] [place], [place] [address]  CBC     Status: Abnormal   Collection Time: 06/01/22  5:32 AM  Result Value Ref Range   WBC 7.1 4.0 - 10.5 K/uL   RBC 3.78 (  L) 4.22 - 5.81 MIL/uL   Hemoglobin 11.0 (L) 13.0 - 17.0 g/dL   HCT 33.7 (L) 39.0 - 52.0 %   MCV 89.2 80.0 - 100.0 fL   MCH 29.1 26.0 - 34.0 pg   MCHC 32.6 30.0 - 36.0 g/dL   RDW 13.5 11.5 - 15.5 %   Platelets 243 150 - 400 K/uL   nRBC 0.0 0.0 - 0.2 %    Comment: Performed at [facility], [address] [place], [place] [address]  Glucose, capillary     Status: Abnormal   Collection Time: 06/01/22  8:21 AM  Result Value Ref Range   Glucose-Capillary 145 (H) 70 - 99 mg/dL    Comment: Glucose reference range applies only to samples taken after fasting for at least 8 hours.  Sedimentation rate     Status: Abnormal   Collection Time: 06/01/22 10:32 AM  Result Value Ref Range   Sed Rate 60 (H) 0 - 20 mm/hr    Comment: Performed at [facility], [address] [place], [place] [address]  Glucose, capillary     Status: Abnormal   Collection Time: 06/01/22 11:29 AM  Result Value Ref Range   Glucose-Capillary 141 (H) 70 - 99 mg/dL    Comment: Glucose reference range applies only to samples taken after fasting for at least 8 hours.    DG Ankle 2 Views  Right  Result Date: 06/01/2022 CLINICAL DATA:  Ankle ulcer EXAM: RIGHT ANKLE - 2 VIEW COMPARISON:  Foot x-ray 05/31/2022 FINDINGS: Two adjacent anterior and posterior distal fibular metaphyseal screws are identified as seen on the foot x-ray. No acute fracture or dislocation. Preserved bone mineralization. Degenerative changes of the dorsal aspect of the midfoot. Small well corticated plantar and Achilles calcaneal spurs. Well corticated density adjacent to the medial malleolus could be sequela of remote trauma or accessory ossicle. Diffuse soft tissue swelling. No definite underlying erosive changes. Question slight periosteal reaction along the lateral margin of the distal fibula. Please correlate with any older studies to assess stability with the surgical changes. IMPRESSION: Orthopedic screws along the distal fibular metaphysis with the adjacent periosteal reaction. This could be related to prior intervention. Please correlate with any prior. Adjacent soft tissue swelling. Chronic and degenerative changes elsewhere about the ankle. No definite erosive changes Electronically Signed   By: Jill Side M.D.   On: 06/01/2022 11:33   DG Foot Complete Right  Result Date: 05/31/2022 CLINICAL DATA:  Foot wounds and swelling, initial encounter EXAM: RIGHT FOOT COMPLETE - 3+ VIEW COMPARISON:  None Available. FINDINGS: Postsurgical changes in the distal tibia are noted. Calcaneal spurring and tarsal degenerative changes are seen. No acute fracture or dislocation is noted. Apparent soft tissue lesion laterally adjacent to the distal fibula. No definitive erosive changes are identified to suggest osteomyelitis. IMPRESSION: Chronic bony changes without acute abnormality. Soft tissue wound laterally without bony change. Electronically Signed   By: Inez Catalina M.D.   On: 05/31/2022 22:18   US Venous Img Lower Unilateral Left  Result Date: 05/31/2022 CLINICAL DATA:  Left leg swelling. EXAM: LEFT LOWER EXTREMITY VENOUS  DOPPLER ULTRASOUND TECHNIQUE: Gray-scale sonography with compression, as well as color and duplex ultrasound, were performed to evaluate the deep venous system(s) from the level of the common femoral vein through the popliteal and proximal calf veins. COMPARISON:  None Available. FINDINGS: VENOUS Normal compressibility of the common femoral, superficial femoral, and popliteal veins, as well as the visualized calf veins. Visualized portions of  profunda femoral vein and great saphenous vein unremarkable. No filling defects to suggest DVT on grayscale or color Doppler imaging. Doppler waveforms show normal direction of venous flow, normal respiratory plasticity and response to augmentation. Limited views of the contralateral common femoral vein are unremarkable. OTHER Bilateral inguinal lymph nodes, the larger on the left measuring 4.4 x 1.5 x 3.8 cm and on the right measuring 2.5 x 1.1 x 1.3 cm. Limitations: none IMPRESSION: 1. No lower extremity DVT. 2. Bilateral inguinal lymph nodes, the larger on the left measuring 4.4 x 1.5 x 3.8 cm and on the right measuring 2.5 x 1.1 x 1.3 cm. Negative. Electronically Signed   By: Keane Police D.O.   On: 05/31/2022 20:01   DG Tibia/Fibula Left  Result Date: 05/31/2022 CLINICAL DATA:  Left leg infection EXAM: LEFT TIBIA AND FIBULA - 2 VIEW COMPARISON:  None Available. FINDINGS: There is no evidence of fracture or other focal bone lesions. No bone destruction. Soft tissues are unremarkable. No radiopaque foreign body. IMPRESSION: Negative. Electronically Signed   By: Rolm Baptise M.D.   On: 05/31/2022 19:22    ROS Blood pressure (!) 146/90, pulse 96, temperature 99.6 F (37.6 C), resp. rate 16, height '5\' 9"'$  (1.753 m), weight 83.9 kg, SpO2 98 %.  Vitals:   05/31/22 2306 06/01/22 0959  BP: (!) 167/99 (!) 146/90  Pulse: 97 96  Resp: 20 16  Temp: 98.1 F (36.7 C) 99.6 F (37.6 C)  SpO2:  98%    General AA&O x3. Normal mood and affect.  Vascular Dorsalis pedis and  posterior tibial pulses  {:23035} {Left/right:33004}  Capillary refill {pe heart capillary fill:310346} to all digits. Pedal hair growth {Desc; normal/diminished:12763}.  Neurologic Epicritic sensation grossly {DESC; ABSENT OR PRESENT:19119}.  Dermatologic (Wound) Wound Location: {:11009} Wound Measurement: *** Wound Base: {findings; base ulcer:11056} Peri-wound: {Peri-wound:10864} Exudate: {:10862}  Orthopedic: Motor intact BLE.    Assessment/Plan:  *** -Imaging: Studies independently reviewed -Antibiotics: *** -WB Status: *** -Wound Care: *** -Surgical Plan: ***  Criselda Peaches 06/01/2022, 12:51 PM   Best available via secure chat for questions or concerns.

## 2022-06-01 NOTE — Progress Notes (Addendum)
PROGRESS NOTE  [name]    DOB: Sep 02, 1960, 62 y.o.  [id number]    Code Status: Full Code   DOA: 05/31/2022   LOS: 1   Brief hospital course  [name] is a 62 y.o. male with a PMH significant for chronic pain, tobacco use.  They presented from home to the ED on 05/31/2022 with lower extremity rash and itching x several weeks. He endorses significant pruritus of the lower extremities.   In the ED, it was found that they had tachycardia up to 125 which resolved spontaneously, elevated BP to 161/95, and otherwise stable ORA.  Significant findings included LA 1.7>2.3, unremarkable metabolic panel, unremarkable CBC, blood cultures collected, A1c in process. XRAY of right foot: no acute changes, Chronic post-surgical bony changes,  XRAY left tib/fib: neg,  [place] L lower extremity: neg.   They were initially treated with analgesia, tdap vaccine, cefepime vancomycin, and LR.   Patient was admitted to medicine service for further workup and management of LE cellulitis as outlined in detail below.  06/01/22 -improved  Assessment & Plan  Principal Problem:   Cellulitis Active Problems:   Rash   Ulcer of ankle, right, limited to breakdown of skin (HCC)   Hyperglycemia   Inguinal lymphadenitis   Diabetic foot (HCC)  Cellulitis- of bilateral lower extremities. Suspect due to excoriations and secondary bacterial infections.  Patient endorses improvement in pain and redness since admission.  Given the history of excessive pruritus and generalized body rash, I have suspicion that he may have autoimmune vascular disease contributing.  - continue IV Abx  - de-escalate - f/u BxCx - added on CRP/ESR for general screening - analgesia PRN - given involvement of R ankle and opening of prior surgical scar, worry about involvement of hardware. Consulted podiatry, appreciate your recs - wound care consult. Recommend non-adhesive dressing - consider steroids - supportive care PRN  Hyperglycemia-  elevated blood pressures and poor circulation to LEs makes it reasonable to consider new diagnosis of diabetes - hgb A1c is pending - sSSI   Inguinal lymphadenitis Likely due to above monitor   Rash- pruritic, petechial. Present on all extremities and trunk for several months. Unknown cause. No known vascular or autoimmune disorder. Describes remote symptoms suspicious for raynauds. Had tattoo in [place] about 40 years ago. not associated with any eosinophilia.   - f/u inflammatory labs - supportive care - likely needs biopsy outpatient. Has no PCP. TOC consulted for PCP needs - repeat CBC with diff in am acute hepatitis panel to try to rule out hepatitis C as well as hepatitis B.  I will check HIV.  Monitor  Body mass index is 27.32 kg/m.  VTE ppx: enoxaparin (LOVENOX) injection 40 mg Start: 05/31/22 2330   Diet:     Diet   Diet Carb Modified Fluid consistency: Thin; Room service appropriate? Yes   Consultants: Podiatry   Subjective 06/01/22    Pt reports feeling improvement in LE pain since admission. Continues to have excessive pruritus in LE bilaterally. Denies CP, SOB. No Nausea/vomiting or fever. No significant weight loss.   Objective   Vitals:   05/31/22 2130 05/31/22 2200 05/31/22 2229 05/31/22 2306  BP: (!) 162/100 (!) 166/99  (!) 167/99  Pulse: 99 86  97  Resp: '14 19  20  '$ Temp:   97.7 F (36.5 C) 98.1 F (36.7 C)  TempSrc:   Oral Oral  SpO2: 100% 100%    Weight:      Height:  Intake/Output Summary (Last 24 hours) at 06/01/2022 0732 Last data filed at 06/01/2022 [id number] Gross per 24 hour  Intake 53 ml  Output --  Net 53 ml   Filed Weights   05/31/22 1718  Weight: 83.9 kg    Physical Exam:  General: awake, alert, NAD HEENT: atraumatic, clear conjunctiva, anicteric sclera, MMM, hearing grossly normal Respiratory: normal respiratory effort. Cardiovascular: quick capillary refill, normal S1/S2, RRR, no JVD, murmurs Gastrointestinal: soft, NT,  ND Nervous: A&O x3. no gross focal neurologic deficits, normal speech Extremities:  Left leg: please see clinical image for further detail. Currently has serous oozing adhering to bandage.  R leg: open ulcer of lateral malleoli with serosanguinous and possible purulent drainage vs granulation tissue.  Skin: petechial rash on legs Psychiatry: normal mood, congruent affect  Labs   I have personally reviewed the following labs and imaging studies CBC    Component Value Date/Time   WBC 7.1 06/01/2022 0532   RBC 3.78 (L) 06/01/2022 0532   HGB 11.0 (L) 06/01/2022 0532   HCT 33.7 (L) 06/01/2022 0532   PLT 243 06/01/2022 0532   MCV 89.2 06/01/2022 0532   MCH 29.1 06/01/2022 0532   MCHC 32.6 06/01/2022 0532   RDW 13.5 06/01/2022 0532   LYMPHSABS 1.0 05/31/2022 1722   MONOABS 0.7 05/31/2022 1722   EOSABS 0.4 05/31/2022 1722   BASOSABS 0.1 05/31/2022 1722      Latest Ref Rng & Units 06/01/2022    5:32 AM 05/31/2022   10:54 PM 05/31/2022    5:22 PM  BMP  Glucose 70 - 99 mg/dL 122   210   BUN 6 - 20 mg/dL 10   14   Creatinine 0.61 - 1.24 mg/dL 0.70  0.72  0.74   Sodium 135 - 145 mmol/L 133   135   Potassium 3.5 - 5.1 mmol/L 3.7   3.7   Chloride 98 - 111 mmol/L 101   101   CO2 22 - 32 mmol/L 25   26   Calcium 8.9 - 10.3 mg/dL 8.3   8.4     DG Foot Complete Right  Result Date: 05/31/2022 CLINICAL DATA:  Foot wounds and swelling, initial encounter EXAM: RIGHT FOOT COMPLETE - 3+ VIEW COMPARISON:  None Available. FINDINGS: Postsurgical changes in the distal tibia are noted. Calcaneal spurring and tarsal degenerative changes are seen. No acute fracture or dislocation is noted. Apparent soft tissue lesion laterally adjacent to the distal fibula. No definitive erosive changes are identified to suggest osteomyelitis. IMPRESSION: Chronic bony changes without acute abnormality. Soft tissue wound laterally without bony change. Electronically Signed   By: Inez Catalina M.D.   On: 05/31/2022 22:18   US  Venous Img Lower Unilateral Left  Result Date: 05/31/2022 CLINICAL DATA:  Left leg swelling. EXAM: LEFT LOWER EXTREMITY VENOUS DOPPLER ULTRASOUND TECHNIQUE: Gray-scale sonography with compression, as well as color and duplex ultrasound, were performed to evaluate the deep venous system(s) from the level of the common femoral vein through the popliteal and proximal calf veins. COMPARISON:  None Available. FINDINGS: VENOUS Normal compressibility of the common femoral, superficial femoral, and popliteal veins, as well as the visualized calf veins. Visualized portions of profunda femoral vein and great saphenous vein unremarkable. No filling defects to suggest DVT on grayscale or color Doppler imaging. Doppler waveforms show normal direction of venous flow, normal respiratory plasticity and response to augmentation. Limited views of the contralateral common femoral vein are unremarkable. OTHER Bilateral inguinal lymph nodes, the larger on the  left measuring 4.4 x 1.5 x 3.8 cm and on the right measuring 2.5 x 1.1 x 1.3 cm. Limitations: none IMPRESSION: 1. No lower extremity DVT. 2. Bilateral inguinal lymph nodes, the larger on the left measuring 4.4 x 1.5 x 3.8 cm and on the right measuring 2.5 x 1.1 x 1.3 cm. Negative. Electronically Signed   By: Keane Police D.O.   On: 05/31/2022 20:01   DG Tibia/Fibula Left  Result Date: 05/31/2022 CLINICAL DATA:  Left leg infection EXAM: LEFT TIBIA AND FIBULA - 2 VIEW COMPARISON:  None Available. FINDINGS: There is no evidence of fracture or other focal bone lesions. No bone destruction. Soft tissues are unremarkable. No radiopaque foreign body. IMPRESSION: Negative. Electronically Signed   By: Rolm Baptise M.D.   On: 05/31/2022 19:22    Disposition Plan & Communication  Patient status: Inpatient  Admitted From: Home Planned disposition location: Home Anticipated discharge date: 3/5 pending further workup  Family Communication: none at bedside    Author: Richarda Osmond, DO [facility] 06/01/2022, 7:32 AM   Available by Epic secure chat 7AM-7PM. If 7PM-7AM, please contact night-coverage.  [facility] contact information found on [email or web address].

## 2022-06-01 NOTE — Consult Note (Signed)
WOC consulted for LE compression wraps, discussed via secure chat with podiatry.  Will await follow up from Dr. Sherryle Lis in the am for application.  Cautious to wrap because will not be removed at most 2x week. Copied SW will need  HHRN for topical wound care and Unna's boot follow up.   [place], [place], [place]

## 2022-06-02 DIAGNOSIS — I83002 Varicose veins of unspecified lower extremity with ulcer of calf: Secondary | ICD-10-CM

## 2022-06-02 DIAGNOSIS — I776 Arteritis, unspecified: Secondary | ICD-10-CM

## 2022-06-02 DIAGNOSIS — E1169 Type 2 diabetes mellitus with other specified complication: Secondary | ICD-10-CM

## 2022-06-02 LAB — COMPREHENSIVE METABOLIC PANEL
ALT: 19 U/L (ref 0–44)
AST: 26 U/L (ref 15–41)
Albumin: 3 g/dL — ABNORMAL LOW (ref 3.5–5.0)
Alkaline Phosphatase: 95 U/L (ref 38–126)
Anion gap: 8 (ref 5–15)
BUN: 12 mg/dL (ref 6–20)
CO2: 27 mmol/L (ref 22–32)
Calcium: 8.5 mg/dL — ABNORMAL LOW (ref 8.9–10.3)
Chloride: 99 mmol/L (ref 98–111)
Creatinine, Ser: 0.75 mg/dL (ref 0.61–1.24)
GFR, Estimated: >60 mL/min (ref >60)
Glucose, Bld: 123 mg/dL — ABNORMAL HIGH (ref 70–99)
Potassium: 3.8 mmol/L (ref 3.5–5.1)
Sodium: 134 mmol/L — ABNORMAL LOW (ref 135–145)
Total Bilirubin: 1.1 mg/dL (ref 0.3–1.2)
Total Protein: 7.6 g/dL (ref 6.5–8.1)

## 2022-06-02 LAB — CBC WITH DIFFERENTIAL/PLATELET
Abs Immature Granulocytes: 0.01 10*3/uL (ref 0.00–0.07)
Basophils Absolute: 0 10*3/uL (ref 0.0–0.1)
Basophils Relative: 1 %
Eosinophils Absolute: 0.3 10*3/uL (ref 0.0–0.5)
Eosinophils Relative: 6 %
HCT: 34.9 % — ABNORMAL LOW (ref 39.0–52.0)
Hemoglobin: 11.4 g/dL — ABNORMAL LOW (ref 13.0–17.0)
Immature Granulocytes: 0 %
Lymphocytes Relative: 18 %
Lymphs Abs: 0.9 10*3/uL (ref 0.7–4.0)
MCH: 29.2 pg (ref 26.0–34.0)
MCHC: 32.7 g/dL (ref 30.0–36.0)
MCV: 89.3 fL (ref 80.0–100.0)
Monocytes Absolute: 0.7 10*3/uL (ref 0.1–1.0)
Monocytes Relative: 13 %
Neutro Abs: 3.4 10*3/uL (ref 1.7–7.7)
Neutrophils Relative %: 62 %
Platelets: 263 10*3/uL (ref 150–400)
RBC: 3.91 MIL/uL — ABNORMAL LOW (ref 4.22–5.81)
RDW: 13.7 % (ref 11.5–15.5)
WBC: 5.4 10*3/uL (ref 4.0–10.5)
nRBC: 0 % (ref 0.0–0.2)

## 2022-06-02 LAB — GLUCOSE, CAPILLARY
Glucose-Capillary: 126 mg/dL — ABNORMAL HIGH (ref 70–99)
Glucose-Capillary: 146 mg/dL — ABNORMAL HIGH (ref 70–99)
Glucose-Capillary: 154 mg/dL — ABNORMAL HIGH (ref 70–99)
Glucose-Capillary: 141 mg/dL — ABNORMAL HIGH (ref 70–99)

## 2022-06-02 LAB — HAPTOGLOBIN: Haptoglobin: 199 mg/dL (ref 29–370)

## 2022-06-02 LAB — HEMOGLOBIN A1C
Hgb A1c MFr Bld: 6.5 % — ABNORMAL HIGH (ref 4.8–5.6)
Mean Plasma Glucose: 140 mg/dL

## 2022-06-02 LAB — HIV ANTIBODY (ROUTINE TESTING W REFLEX): HIV Screen 4th Generation wRfx: NONREACTIVE

## 2022-06-02 MED ORDER — SODIUM CHLORIDE 0.9 % IV SOLN
1.0000 g | INTRAVENOUS | Status: DC
  Administered 2022-06-02 – 2022-06-03 (×2): 1 g via INTRAVENOUS
  Filled 2022-06-02 (×2): qty 1

## 2022-06-02 NOTE — Progress Notes (Signed)
  Subjective:  Patient ID: [name], male    DOB: 1961-01-21,  MRN: [id number]  {ros [id number] Objective:   Vitals:   06/01/22 2358 06/02/22 0843  BP: (!) 148/87 (!) 140/84  Pulse: 79 (!) 105  Resp: 20 17  Temp: 98.9 F (37.2 C) 98 F (36.7 C)  SpO2: 99% 98%   General AA&O x3. Normal mood and affect.  Vascular Dorsalis pedis and posterior tibial pulses 2/4 bilat. Brisk capillary refill to all digits. Pedal hair present.  Neurologic Epicritic sensation grossly intact.  Dermatologic No open lesions. Interspaces clear of maceration. Nails well groomed and normal in appearance.  Orthopedic: MMT 5/5 in dorsiflexion, plantarflexion, inversion, and eversion. Normal joint ROM without pain or crepitus.    Assessment & Plan:  Patient was evaluated and treated and all questions answered.  *** -  Criselda Peaches, DPM  Accessible via secure chat for questions or concerns.

## 2022-06-02 NOTE — Progress Notes (Signed)
PROGRESS NOTE  [name]    DOB: 11/30/1960, 62 y.o.  [id number]    Code Status: Full Code   DOA: 05/31/2022   LOS: 2   Brief hospital course  [name] is a 62 y.o. male with a PMH significant for chronic pain, tobacco use.  They presented from home to the ED on 05/31/2022 with lower extremity rash and itching x several weeks. He endorses significant pruritus of the lower extremities.   In the ED, it was found that they had tachycardia up to 125 which resolved spontaneously, elevated BP to 161/95, and otherwise stable ORA.  Significant findings included LA 1.7>2.3, unremarkable metabolic panel, unremarkable CBC, blood cultures collected, A1c in process. XRAY of right foot: no acute changes, Chronic post-surgical bony changes,  XRAY left tib/fib: neg,  [place] L lower extremity: neg.   They were initially treated with analgesia, tdap vaccine, cefepime vancomycin, and LR.   Patient was admitted to medicine service for further workup and management of LE cellulitis as outlined in detail below.  3/3: consulted podiatry for further evaluation  06/02/22 -improved  Assessment & Plan  Principal Problem:   Cellulitis Active Problems:   Rash   Ulcer of ankle, right, limited to breakdown of skin (HCC)   Hyperglycemia   Inguinal lymphadenitis   Diabetic foot (HCC)   Venous stasis ulcer of calf with fat layer exposed with varicose veins (HCC)   Type 2 diabetes mellitus with other specified complication (HCC)   Vasculitis (HCC)  Cellulitis- of bilateral lower extremities. Suspect due to excoriations and secondary bacterial infections. Further improvement in pain and redness since admission. Given the history of excessive pruritus and generalized body rash, I have suspicion that he may have inflammatory/autoimmune vascular disease contributing. ESR 60, CRP 6.7 further indications but not specific.  Blood cultures NGTD, remains afebrile.  - continue IV Abx  - de-escalate with clinical  improvement - f/u BxCx - analgesia PRN - Consulted podiatry, appreciate your recs - would recommend biopsy either in/or outpatient - wound care consult. Recommend non-adhesive dressing  - unna boots at dc - consider steroids - supportive care PRN  Hyperglycemia- elevated blood sugars and poor circulation to LEs makes it reasonable to consider new diagnosis of diabetes. Hgb A1c on admission is 6.5 so patient is considered pre-diabetic - sSSI   Inguinal lymphadenitis Likely due to above monitor   Rash- pruritic, petechial. Present on all extremities and trunk for several months. Unknown cause. No known inflammatory vascular or autoimmune disorder. Describes remote symptoms suspicious for raynauds. Had tattoo in [place] about 40 years ago. not associated with any eosinophilia.  pruritus responded well to atarax. Acute hepatitis panel, and HIV negative.  - f/u inflammatory labs - supportive care - likely needs biopsy outpatient. Has no PCP. TOC consulted for PCP needs - repeat CBC with diff in am  Body mass index is 27.32 kg/m.  VTE ppx: enoxaparin (LOVENOX) injection 40 mg Start: 05/31/22 2330   Diet:     Diet   Diet Carb Modified Fluid consistency: Thin; Room service appropriate? Yes   Consultants: Podiatry   Subjective 06/02/22    Pt reports feeling improvement in LE pain and swelling. His itching has ended. Denies CP, SOB, N/V/D. Denies any past IVDU.  Agreeable to continued IV Abx.    Objective   Vitals:   05/31/22 2306 06/01/22 0959 06/01/22 1603 06/01/22 2358  BP: (!) 167/99 (!) 146/90 (!) 149/93 (!) 148/87  Pulse: 97 96 99 79  Resp: '20 16 18 20  '$ Temp: 98.1 F (36.7 C) 99.6 F (37.6 C) 98.8 F (37.1 C) 98.9 F (37.2 C)  TempSrc: Oral     SpO2:  98% 97% 99%  Weight:      Height:        Intake/Output Summary (Last 24 hours) at 06/02/2022 [id number] Last data filed at 06/02/2022 [id number] Gross per 24 hour  Intake 855.4 ml  Output --  Net 855.4 ml    Filed  Weights   05/31/22 1718  Weight: 83.9 kg    Physical Exam:  General: awake, alert, NAD HEENT: atraumatic, clear conjunctiva, anicteric sclera, MMM, hearing grossly normal Respiratory: normal respiratory effort. Cardiovascular: quick capillary refill, normal S1/S2, RRR, no JVD, murmurs Nervous: A&O x3. no gross focal neurologic deficits, normal speech Extremities:  Left leg: please see clinical image for further detail. Currently has serous oozing. R leg: open ulcer of lateral malleoli with serosanguinous and possible purulent drainage vs granulation tissue.  Skin: petechial rash on legs, arms, and trunk. Skin peeling on legs from prior edema Psychiatry: normal mood, congruent affect  Labs   I have personally reviewed the following labs and imaging studies CBC    Component Value Date/Time   WBC 5.4 06/02/2022 0432   RBC 3.91 (L) 06/02/2022 0432   HGB 11.4 (L) 06/02/2022 0432   HCT 34.9 (L) 06/02/2022 0432   PLT 263 06/02/2022 0432   MCV 89.3 06/02/2022 0432   MCH 29.2 06/02/2022 0432   MCHC 32.7 06/02/2022 0432   RDW 13.7 06/02/2022 0432   LYMPHSABS 0.9 06/02/2022 0432   MONOABS 0.7 06/02/2022 0432   EOSABS 0.3 06/02/2022 0432   BASOSABS 0.0 06/02/2022 0432      Latest Ref Rng & Units 06/02/2022    4:32 AM 06/01/2022    5:32 AM 05/31/2022   10:54 PM  BMP  Glucose 70 - 99 mg/dL 123  122    BUN 6 - 20 mg/dL 12  10    Creatinine 0.61 - 1.24 mg/dL 0.75  0.70  0.72   Sodium 135 - 145 mmol/L 134  133    Potassium 3.5 - 5.1 mmol/L 3.8  3.7    Chloride 98 - 111 mmol/L 99  101    CO2 22 - 32 mmol/L 27  25    Calcium 8.9 - 10.3 mg/dL 8.5  8.3      DG Ankle 2 Views Right  Result Date: 06/01/2022 CLINICAL DATA:  Ankle ulcer EXAM: RIGHT ANKLE - 2 VIEW COMPARISON:  Foot x-ray 05/31/2022 FINDINGS: Two adjacent anterior and posterior distal fibular metaphyseal screws are identified as seen on the foot x-ray. No acute fracture or dislocation. Preserved bone mineralization. Degenerative  changes of the dorsal aspect of the midfoot. Small well corticated plantar and Achilles calcaneal spurs. Well corticated density adjacent to the medial malleolus could be sequela of remote trauma or accessory ossicle. Diffuse soft tissue swelling. No definite underlying erosive changes. Question slight periosteal reaction along the lateral margin of the distal fibula. Please correlate with any older studies to assess stability with the surgical changes. IMPRESSION: Orthopedic screws along the distal fibular metaphysis with the adjacent periosteal reaction. This could be related to prior intervention. Please correlate with any prior. Adjacent soft tissue swelling. Chronic and degenerative changes elsewhere about the ankle. No definite erosive changes Electronically Signed   By: Jill Side M.D.   On: 06/01/2022 11:33   DG Foot Complete Right  Result Date: 05/31/2022 CLINICAL DATA:  Foot wounds and swelling, initial encounter EXAM: RIGHT FOOT COMPLETE - 3+ VIEW COMPARISON:  None Available. FINDINGS: Postsurgical changes in the distal tibia are noted. Calcaneal spurring and tarsal degenerative changes are seen. No acute fracture or dislocation is noted. Apparent soft tissue lesion laterally adjacent to the distal fibula. No definitive erosive changes are identified to suggest osteomyelitis. IMPRESSION: Chronic bony changes without acute abnormality. Soft tissue wound laterally without bony change. Electronically Signed   By: Inez Catalina M.D.   On: 05/31/2022 22:18   US Venous Img Lower Unilateral Left  Result Date: 05/31/2022 CLINICAL DATA:  Left leg swelling. EXAM: LEFT LOWER EXTREMITY VENOUS DOPPLER ULTRASOUND TECHNIQUE: Gray-scale sonography with compression, as well as color and duplex ultrasound, were performed to evaluate the deep venous system(s) from the level of the common femoral vein through the popliteal and proximal calf veins. COMPARISON:  None Available. FINDINGS: VENOUS Normal compressibility of  the common femoral, superficial femoral, and popliteal veins, as well as the visualized calf veins. Visualized portions of profunda femoral vein and great saphenous vein unremarkable. No filling defects to suggest DVT on grayscale or color Doppler imaging. Doppler waveforms show normal direction of venous flow, normal respiratory plasticity and response to augmentation. Limited views of the contralateral common femoral vein are unremarkable. OTHER Bilateral inguinal lymph nodes, the larger on the left measuring 4.4 x 1.5 x 3.8 cm and on the right measuring 2.5 x 1.1 x 1.3 cm. Limitations: none IMPRESSION: 1. No lower extremity DVT. 2. Bilateral inguinal lymph nodes, the larger on the left measuring 4.4 x 1.5 x 3.8 cm and on the right measuring 2.5 x 1.1 x 1.3 cm. Negative. Electronically Signed   By: Keane Police D.O.   On: 05/31/2022 20:01   DG Tibia/Fibula Left  Result Date: 05/31/2022 CLINICAL DATA:  Left leg infection EXAM: LEFT TIBIA AND FIBULA - 2 VIEW COMPARISON:  None Available. FINDINGS: There is no evidence of fracture or other focal bone lesions. No bone destruction. Soft tissues are unremarkable. No radiopaque foreign body. IMPRESSION: Negative. Electronically Signed   By: Rolm Baptise M.D.   On: 05/31/2022 19:22    Disposition Plan & Communication  Patient status: Inpatient  Admitted From: Home Planned disposition location: Home Anticipated discharge date: 3/5 pending further workup  Family Communication: none at bedside    Author: Richarda Osmond, DO [facility] 06/02/2022, 8:08 AM   Available by Epic secure chat 7AM-7PM. If 7PM-7AM, please contact night-coverage.  [facility] contact information found on [email or web address].

## 2022-06-02 NOTE — Plan of Care (Signed)
  Problem: Coping: Goal: Ability to adjust to condition or change in health will improve Outcome: Progressing   Problem: Fluid Volume: Goal: Ability to maintain a balanced intake and output will improve Outcome: Progressing   Problem: Health Behavior/Discharge Planning: Goal: Ability to manage health-related needs will improve Outcome: Progressing   Problem: Metabolic: Goal: Ability to maintain appropriate glucose levels will improve Outcome: Progressing   Problem: Education: Goal: Knowledge of General Education information will improve Description: Including pain rating scale, medication(s)/side effects and non-pharmacologic comfort measures Outcome: Progressing   

## 2022-06-03 LAB — COMPREHENSIVE METABOLIC PANEL
ALT: 21 U/L (ref 0–44)
AST: 22 U/L (ref 15–41)
Albumin: 2.9 g/dL — ABNORMAL LOW (ref 3.5–5.0)
Alkaline Phosphatase: 100 U/L (ref 38–126)
Anion gap: 5 (ref 5–15)
BUN: 19 mg/dL (ref 6–20)
CO2: 27 mmol/L (ref 22–32)
Calcium: 8.7 mg/dL — ABNORMAL LOW (ref 8.9–10.3)
Chloride: 101 mmol/L (ref 98–111)
Creatinine, Ser: 0.75 mg/dL (ref 0.61–1.24)
GFR, Estimated: >60 mL/min (ref >60)
Glucose, Bld: 135 mg/dL — ABNORMAL HIGH (ref 70–99)
Potassium: 3.9 mmol/L (ref 3.5–5.1)
Sodium: 133 mmol/L — ABNORMAL LOW (ref 135–145)
Total Bilirubin: 0.5 mg/dL (ref 0.3–1.2)
Total Protein: 7.5 g/dL (ref 6.5–8.1)

## 2022-06-03 LAB — ANA COMPREHENSIVE PANEL

## 2022-06-03 LAB — CBC WITH DIFFERENTIAL/PLATELET
Abs Immature Granulocytes: 0.01 10*3/uL (ref 0.00–0.07)
Basophils Absolute: 0 10*3/uL (ref 0.0–0.1)
Basophils Relative: 0 %
Eosinophils Absolute: 0.5 10*3/uL (ref 0.0–0.5)
Eosinophils Relative: 9 %
HCT: 38.3 % — ABNORMAL LOW (ref 39.0–52.0)
Hemoglobin: 12.2 g/dL — ABNORMAL LOW (ref 13.0–17.0)
Immature Granulocytes: 0 %
Lymphocytes Relative: 22 %
Lymphs Abs: 1.1 10*3/uL (ref 0.7–4.0)
MCH: 28.9 pg (ref 26.0–34.0)
MCHC: 31.9 g/dL (ref 30.0–36.0)
MCV: 90.8 fL (ref 80.0–100.0)
Monocytes Absolute: 0.8 10*3/uL (ref 0.1–1.0)
Monocytes Relative: 16 %
Neutro Abs: 2.8 10*3/uL (ref 1.7–7.7)
Neutrophils Relative %: 53 %
Platelets: 278 10*3/uL (ref 150–400)
RBC: 4.22 MIL/uL (ref 4.22–5.81)
RDW: 13.4 % (ref 11.5–15.5)
WBC: 5.3 10*3/uL (ref 4.0–10.5)
nRBC: 0 % (ref 0.0–0.2)

## 2022-06-03 LAB — GLUCOSE, CAPILLARY
Glucose-Capillary: 121 mg/dL — ABNORMAL HIGH (ref 70–99)
Glucose-Capillary: 217 mg/dL — ABNORMAL HIGH (ref 70–99)

## 2022-06-03 LAB — C3 COMPLEMENT: C3 Complement: 152 mg/dL (ref 82–167)

## 2022-06-03 LAB — RHEUMATOID FACTOR: Rheumatoid fact SerPl-aCnc: 16.9 IU/mL — ABNORMAL HIGH (ref <14.0)

## 2022-06-03 LAB — C4 COMPLEMENT: Complement C4, Body Fluid: 22 mg/dL (ref 12–38)

## 2022-06-03 LAB — CYCLIC CITRUL PEPTIDE ANTIBODY, IGG/IGA: CCP Antibodies IgG/IgA: 5 units (ref 0–19)

## 2022-06-03 MED ORDER — ACETAMINOPHEN 500 MG PO TABS: 500.0000 mg | ORAL_TABLET | ORAL | 0 refills | Status: AC | PRN

## 2022-06-03 MED ORDER — HYDROXYZINE HCL 10 MG PO TABS: 10.0000 mg | ORAL_TABLET | Freq: Three times a day (TID) | ORAL | 0 refills | Status: AC | PRN

## 2022-06-03 MED ORDER — CEPHALEXIN 250 MG PO CAPS: 250.0000 mg | ORAL_CAPSULE | Freq: Four times a day (QID) | ORAL | 0 refills | Status: AC

## 2022-06-03 NOTE — Plan of Care (Signed)

## 2022-06-03 NOTE — TOC Progression Note (Addendum)
Transition of Care [facility]) - Progression Note    Patient Details  Name: [name] MRN: [id number] Date of Birth: [birth year]  [facility] CM/SW [place], RN Phone Number: 06/03/2022, 11:17 AM  Clinical Narrative:    Spoke with the patient He lives at home with his wife He drive to the hospital and will drive home I spoke with Mortimer Fries at [facility] and they will check to see what the patient's Copay is He will let us know  so that information can be provided to the patient We are setting him up with PCP at [facility] and that information will be placed on his DC packet , Dr Max Sane has agreed to sign the [facility] orders until PCP appointment  The patient report that he is independent at home and does not have additional needs  [place] AT 06/09/22 AT 1P   Expected Discharge Plan: [place] Barriers to Discharge: ED PCP establishment  Expected Discharge Plan and Services   Discharge Planning Services: CM Consult   Living arrangements for the past 2 months: Single Family Home Expected Discharge Date: 06/03/22                         [facility] Arranged: RN [facility] Agency: [place] Date [facility] Agency Contacted: 06/03/22 Time HH Agency Contacted: [phone] Representative spoke with at [place]: [place] Determinants of Health ([place]) Interventions SDOH Screenings   Food Insecurity: No Food Insecurity (05/31/2022)  Housing: Low Risk  (05/31/2022)  Transportation Needs: No Transportation Needs (05/31/2022)  Utilities: Not At Risk (05/31/2022)  Tobacco Use: High Risk (05/31/2022)    Readmission Risk Interventions     No data to display

## 2022-06-03 NOTE — Plan of Care (Signed)
Problem: Education: Goal: Ability to describe self-care measures that may prevent or decrease complications (Diabetes Survival Skills Education) will improve 06/03/2022 1514 by Orvan Seen, RN Outcome: Completed/Met 06/03/2022 1148 by Orvan Seen, RN Outcome: Progressing Goal: Individualized Educational Video(s) 06/03/2022 1514 by Orvan Seen, RN Outcome: Completed/Met 06/03/2022 1148 by Orvan Seen, RN Outcome: Progressing   Problem: Coping: Goal: Ability to adjust to condition or change in health will improve 06/03/2022 1514 by Orvan Seen, RN Outcome: Completed/Met 06/03/2022 1148 by Orvan Seen, RN Outcome: Progressing   Problem: Fluid Volume: Goal: Ability to maintain a balanced intake and output will improve 06/03/2022 1514 by Orvan Seen, RN Outcome: Completed/Met 06/03/2022 1148 by Orvan Seen, RN Outcome: Progressing   Problem: Health Behavior/Discharge Planning: Goal: Ability to identify and utilize available resources and services will improve 06/03/2022 1514 by Orvan Seen, RN Outcome: Completed/Met 06/03/2022 1148 by Orvan Seen, RN Outcome: Progressing Goal: Ability to manage health-related needs will improve 06/03/2022 1514 by Orvan Seen, RN Outcome: Completed/Met 06/03/2022 1148 by Orvan Seen, RN Outcome: Progressing   Problem: Metabolic: Goal: Ability to maintain appropriate glucose levels will improve 06/03/2022 1514 by Orvan Seen, RN Outcome: Completed/Met 06/03/2022 1148 by Orvan Seen, RN Outcome: Progressing   Problem: Nutritional: Goal: Maintenance of adequate nutrition will improve 06/03/2022 1514 by Orvan Seen, RN Outcome: Completed/Met 06/03/2022 1148 by Orvan Seen, RN Outcome: Progressing Goal: Progress toward achieving an optimal weight will improve 06/03/2022 1514 by Orvan Seen, RN Outcome: Completed/Met 06/03/2022 1148 by Orvan Seen, RN Outcome: Progressing   Problem:  Skin Integrity: Goal: Risk for impaired skin integrity will decrease 06/03/2022 1514 by Orvan Seen, RN Outcome: Completed/Met 06/03/2022 1148 by Orvan Seen, RN Outcome: Progressing   Problem: Tissue Perfusion: Goal: Adequacy of tissue perfusion will improve 06/03/2022 1514 by Orvan Seen, RN Outcome: Completed/Met 06/03/2022 1148 by Orvan Seen, RN Outcome: Progressing   Problem: Education: Goal: Knowledge of General Education information will improve Description: Including pain rating scale, medication(s)/side effects and non-pharmacologic comfort measures 06/03/2022 1514 by Orvan Seen, RN Outcome: Completed/Met 06/03/2022 1148 by Orvan Seen, RN Outcome: Progressing   Problem: Health Behavior/Discharge Planning: Goal: Ability to manage health-related needs will improve 06/03/2022 1514 by Orvan Seen, RN Outcome: Completed/Met 06/03/2022 1148 by Orvan Seen, RN Outcome: Progressing   Problem: Clinical Measurements: Goal: Ability to maintain clinical measurements within normal limits will improve 06/03/2022 1514 by Orvan Seen, RN Outcome: Completed/Met 06/03/2022 1148 by Orvan Seen, RN Outcome: Progressing Goal: Will remain free from infection 06/03/2022 1514 by Orvan Seen, RN Outcome: Completed/Met 06/03/2022 1148 by Orvan Seen, RN Outcome: Progressing Goal: Diagnostic test results will improve 06/03/2022 1514 by Orvan Seen, RN Outcome: Completed/Met 06/03/2022 1148 by Orvan Seen, RN Outcome: Progressing Goal: Respiratory complications will improve 06/03/2022 1514 by Orvan Seen, RN Outcome: Completed/Met 06/03/2022 1148 by Orvan Seen, RN Outcome: Progressing Goal: Cardiovascular complication will be avoided 06/03/2022 1514 by Orvan Seen, RN Outcome: Completed/Met 06/03/2022 1148 by Orvan Seen, RN Outcome: Progressing   Problem: Activity: Goal: Risk for activity intolerance will  decrease 06/03/2022 1514 by Orvan Seen, RN Outcome: Completed/Met 06/03/2022 1148 by Orvan Seen, RN Outcome: Progressing   Problem: Nutrition: Goal: Adequate nutrition will be maintained 06/03/2022 1514 by Orvan Seen, RN Outcome: Completed/Met 06/03/2022 1148 by Orvan Seen, RN Outcome: Progressing  Problem: Coping: Goal: Level of anxiety will decrease 06/03/2022 1514 by Orvan Seen, RN Outcome: Completed/Met 06/03/2022 1148 by Orvan Seen, RN Outcome: Progressing   Problem: Elimination: Goal: Will not experience complications related to bowel motility 06/03/2022 1514 by Orvan Seen, RN Outcome: Completed/Met 06/03/2022 1148 by Orvan Seen, RN Outcome: Progressing Goal: Will not experience complications related to urinary retention 06/03/2022 1514 by Orvan Seen, RN Outcome: Completed/Met 06/03/2022 1148 by Orvan Seen, RN Outcome: Progressing   Problem: Pain Managment: Goal: General experience of comfort will improve 06/03/2022 1514 by Orvan Seen, RN Outcome: Completed/Met 06/03/2022 1148 by Orvan Seen, RN Outcome: Progressing   Problem: Safety: Goal: Ability to remain free from injury will improve 06/03/2022 1514 by Orvan Seen, RN Outcome: Completed/Met 06/03/2022 1148 by Orvan Seen, RN Outcome: Progressing   Problem: Skin Integrity: Goal: Risk for impaired skin integrity will decrease 06/03/2022 1514 by Orvan Seen, RN Outcome: Completed/Met 06/03/2022 1148 by Orvan Seen, RN Outcome: Progressing

## 2022-06-03 NOTE — Discharge Summary (Signed)
Physician Discharge Summary  Patient: [name] [id number] DOB: 12/11/1960   Code Status: Full Code Admit date: 05/31/2022 Discharge date: 06/03/2022 Disposition: Home health, wound care PCP: Patient, No Pcp Per  Recommendations for Outpatient Follow-up:  Follow up with PCP within 1-2 weeks Regarding general hospital follow up and preventative care Recommend monitoring diabetes which is a new diagnosis Follow up remainder of unresulted inflammatory labs Follow up with podiatry within 1 week Regarding blood work results and further care of lower extremity wounds  Discharge Diagnoses:  Principal Problem:   Cellulitis Active Problems:   Rash   Ulcer of ankle, right, limited to breakdown of skin (HCC)   Hyperglycemia   Inguinal lymphadenitis   Diabetic foot (HCC)   Venous stasis ulcer of calf with fat layer exposed with varicose veins ([place])   Type 2 diabetes mellitus with other specified complication ([place])   Vasculitis [facility])  Brief Hospital Course Summary: [name] is a 62 y.o. male with a PMH significant for chronic pain, tobacco use.   They presented from home to the ED on 05/31/2022 with lower extremity rash and itching x several weeks. He endorses significant pruritus of extremities and trunk that fluctuates daily over the past several months.    In the ED, it was found that they had tachycardia up to 125 which resolved spontaneously, elevated BP to 161/95, and otherwise stable ORA.  Significant findings included LA 1.7>2.3, unremarkable metabolic panel, unremarkable CBC, blood cultures collected, A1c in process. XRAY of right foot: no acute changes, Chronic post-surgical bony changes,  XRAY left tib/fib: neg,  [place] L lower extremity: neg DVT   They were initially treated with analgesia, tdap vaccine, cefepime vancomycin, and LR.    3/3: consulted podiatry for further evaluation. They ordered further imaging to rule out need for surgical intervention. Transitioned to CTX for  cellulitis treatment. I have suspicion that patient may have inflammatory/autoimmune vasculitis contributing to petechia rash and pruritus. ESR and CRP are markedly elevated and further blood work is pending. The cellulitis of LLE is the result of frequent excoriations. Chronically, he has venous stasis changes on bilateral LE. Wound care nurse was consulted.   3/4: No surgical intervention needed. Patient continues to have clinical improvement on IV Abx. He remains afebrile. [place] nurse applied unna boots to LLE.   3/5: transitioned to PO Abx for cellulitis. No procedures planned by podiatry inpatient but may proceed with biopsy to evaluate for inflammatory/autoimmune disease outpatient on follow up. Labs are still pending.  His pruritus is well controlled on atarax which was prescribed at dc.  Home health wound care was ordered for dc.  TOC was consulted to assist in patient getting assigned a PCP for follow up.   Additionally, patient's A1c resulted at 6.5.  Discharge Condition: Good, improved Recommended discharge diet: Regular healthy diet  Consultations: Podiatry   Procedures/Studies: None   Allergies as of 06/03/2022   Not on File      Medication List     TAKE these medications    acetaminophen 500 MG tablet Commonly known as: TYLENOL Take 1 tablet (500 mg total) by mouth every 4 (four) hours as needed for mild pain.   cephALEXin 250 MG capsule Commonly known as: KEFLEX Take 1 capsule (250 mg total) by mouth 4 (four) times daily for 5 days.   hydrOXYzine 10 MG tablet Commonly known as: ATARAX Take 1 tablet (10 mg total) by mouth 3 (three) times daily as needed for up to 15  days for itching.       Subjective   Pt reports feeling well today. States that his itching, pain and swelling are greatly improved. Expresses understanding with the follow up plan.   All questions and concerns were addressed at time of discharge.  Objective  Blood pressure 139/82, pulse 77,  temperature 98 F (36.7 C), resp. rate 18, height '5\' 9"'$  (1.753 m), weight 83.9 kg, SpO2 97 %.   General: Pt is alert, awake, not in acute distress Cardiovascular: RRR, S1/S2 +, no rubs, no gallops Respiratory: CTA bilaterally, no wheezing, no rhonchi Abdominal: Soft, NT, ND, bowel sounds + Extremities: heeling ulcer of right lateral malleolus.  Left lower extremity in unna boot wrap. Surrounding tissue has improved erythema and swelling. Patient is able to ambulate and denies pain to palpation  The results of significant diagnostics from this hospitalization (including imaging, microbiology, ancillary and laboratory) are listed below for reference.   Imaging studies: DG Ankle 2 Views Right  Result Date: 06/01/2022 CLINICAL DATA:  Ankle ulcer EXAM: RIGHT ANKLE - 2 VIEW COMPARISON:  Foot x-ray 05/31/2022 FINDINGS: Two adjacent anterior and posterior distal fibular metaphyseal screws are identified as seen on the foot x-ray. No acute fracture or dislocation. Preserved bone mineralization. Degenerative changes of the dorsal aspect of the midfoot. Small well corticated plantar and Achilles calcaneal spurs. Well corticated density adjacent to the medial malleolus could be sequela of remote trauma or accessory ossicle. Diffuse soft tissue swelling. No definite underlying erosive changes. Question slight periosteal reaction along the lateral margin of the distal fibula. Please correlate with any older studies to assess stability with the surgical changes. IMPRESSION: Orthopedic screws along the distal fibular metaphysis with the adjacent periosteal reaction. This could be related to prior intervention. Please correlate with any prior. Adjacent soft tissue swelling. Chronic and degenerative changes elsewhere about the ankle. No definite erosive changes Electronically Signed   By: Jill Side M.D.   On: 06/01/2022 11:33   DG Foot Complete Right  Result Date: 05/31/2022 CLINICAL DATA:  Foot wounds and  swelling, initial encounter EXAM: RIGHT FOOT COMPLETE - 3+ VIEW COMPARISON:  None Available. FINDINGS: Postsurgical changes in the distal tibia are noted. Calcaneal spurring and tarsal degenerative changes are seen. No acute fracture or dislocation is noted. Apparent soft tissue lesion laterally adjacent to the distal fibula. No definitive erosive changes are identified to suggest osteomyelitis. IMPRESSION: Chronic bony changes without acute abnormality. Soft tissue wound laterally without bony change. Electronically Signed   By: Inez Catalina M.D.   On: 05/31/2022 22:18   US Venous Img Lower Unilateral Left  Result Date: 05/31/2022 CLINICAL DATA:  Left leg swelling. EXAM: LEFT LOWER EXTREMITY VENOUS DOPPLER ULTRASOUND TECHNIQUE: Gray-scale sonography with compression, as well as color and duplex ultrasound, were performed to evaluate the deep venous system(s) from the level of the common femoral vein through the popliteal and proximal calf veins. COMPARISON:  None Available. FINDINGS: VENOUS Normal compressibility of the common femoral, superficial femoral, and popliteal veins, as well as the visualized calf veins. Visualized portions of profunda femoral vein and great saphenous vein unremarkable. No filling defects to suggest DVT on grayscale or color Doppler imaging. Doppler waveforms show normal direction of venous flow, normal respiratory plasticity and response to augmentation. Limited views of the contralateral common femoral vein are unremarkable. OTHER Bilateral inguinal lymph nodes, the larger on the left measuring 4.4 x 1.5 x 3.8 cm and on the right measuring 2.5 x 1.1 x 1.3 cm. Limitations:  none IMPRESSION: 1. No lower extremity DVT. 2. Bilateral inguinal lymph nodes, the larger on the left measuring 4.4 x 1.5 x 3.8 cm and on the right measuring 2.5 x 1.1 x 1.3 cm. Negative. Electronically Signed   By: Keane Police D.O.   On: 05/31/2022 20:01   DG Tibia/Fibula Left  Result Date: 05/31/2022 CLINICAL  DATA:  Left leg infection EXAM: LEFT TIBIA AND FIBULA - 2 VIEW COMPARISON:  None Available. FINDINGS: There is no evidence of fracture or other focal bone lesions. No bone destruction. Soft tissues are unremarkable. No radiopaque foreign body. IMPRESSION: Negative. Electronically Signed   By: Rolm Baptise M.D.   On: 05/31/2022 19:22    Labs: Basic Metabolic Panel: Recent Labs  Lab 05/31/22 1722 05/31/22 2254 06/01/22 0532 06/02/22 0432 06/03/22 0330  NA 135  --  133* 134* 133*  K 3.7  --  3.7 3.8 3.9  CL 101  --  101 99 101  CO2 26  --  '25 27 27  '$ GLUCOSE 210*  --  122* 123* 135*  BUN 14  --  '10 12 19  '$ CREATININE 0.74 0.72 0.70 0.75 0.75  CALCIUM 8.4*  --  8.3* 8.5* 8.7*   CBC: Recent Labs  Lab 05/31/22 1722 05/31/22 2254 06/01/22 0532 06/02/22 0432 06/03/22 0330  WBC 6.9 6.0 7.1 5.4 5.3  NEUTROABS 4.7  --   --  3.4 2.8  HGB 11.1* 11.6* 11.0* 11.4* 12.2*  HCT 34.3* 36.2* 33.7* 34.9* 38.3*  MCV 89.8 91.0 89.2 89.3 90.8  PLT 259 241 243 263 278   Microbiology: Results for orders placed or performed during the hospital encounter of 05/31/22  Blood culture (routine x 2)     Status: None (Preliminary result)   Collection Time: 05/31/22  5:22 PM   Specimen: BLOOD  Result Value Ref [place] Status   Specimen Description BLOOD LEFT ANTECUBITAL  Final   Special Requests   Final    BOTTLES DRAWN AEROBIC AND ANAEROBIC Blood Culture adequate volume   Culture   Final    NO GROWTH 3 DAYS Performed at [facility], [address] [place], [place] [address]    Report Status PENDING  Incomplete  Blood culture (routine x 2)     Status: None (Preliminary result)   Collection Time: 05/31/22  7:38 PM   Specimen: BLOOD  Result Value Ref [place] Status   Specimen Description BLOOD RIGHT ANTECUBITAL  Final   Special Requests   Final    BOTTLES DRAWN AEROBIC AND ANAEROBIC Blood Culture adequate volume   Culture   Final    NO GROWTH 3 DAYS Performed at [facility], [address] [place], [place] [address]    Report Status PENDING  Incomplete  Surgical pcr screen     Status: Abnormal   Collection Time: 05/31/22  7:38 PM   Specimen: Nasal Mucosa; Nasal Swab  Result Value Ref [place] Status   MRSA, PCR NEGATIVE NEGATIVE Final   Staphylococcus aureus POSITIVE (A) NEGATIVE Final    Comment: (NOTE) The Xpert SA Assay (FDA approved for NASAL specimens in patients 50 years of age and older), is one component of a comprehensive surveillance program. It is not intended to diagnose infection nor to guide or monitor treatment. Performed at [facility], [address] [place],  [address]    Time coordinating discharge: Over 30 minutes  Richarda Osmond, MD  [facility] 06/03/2022, 10:36 AM

## 2022-06-03 NOTE — Progress Notes (Signed)
Patient discharged to home with Home Health for wound care.  Patient A+Ox4. No complaints of pain.  Medications and discharge instructions reviewed.  All questions answered. PIV x 1 removed, no bleeding, intact. Patient verbalized understanding of signs and symptoms of infection.  Patient will follow up with all appointments that need to be called and appointments made as listed on AVS with new PCP and Podiatrist.  Patient satisfied with overall care at [facility].

## 2022-06-03 NOTE — Discharge Instructions (Addendum)
Please follow up with podiatry to discuss your blood work results and if needing further workup including a biopsy. Your dressing will stay on your wound until follow up with wound care either at the podiatry office or with a wound care nurse. Our case manager has set you up with a new PCP. I recommend following up with them for hospital follow up and discuss with them routine health maintenance and disease prevention.  I have prescribed an antibiotic for your infection to take until it is completed and atarax which will help with your itching.  Since atarax can cause sedation, please do not take prior to driving

## 2022-06-04 LAB — COMPLEMENT, TOTAL: Compl, Total (CH50): >60 U/mL (ref >41)

## 2022-06-05 LAB — ANCA PROFILE
Anti-MPO Antibodies: <0.2 units (ref 0.0–0.9)
Anti-PR3 Antibodies: <0.2 units (ref 0.0–0.9)
Atypical P-ANCA titer: <1:20 {titer}
C-ANCA: <1:20 {titer}
P-ANCA: <1:20 {titer}

## 2022-06-05 LAB — CULTURE, BLOOD (ROUTINE X 2)
Culture: NO GROWTH
Culture: NO GROWTH
Special Requests: ADEQUATE
Special Requests: ADEQUATE

## 2022-06-08 LAB — CRYOGLOBULIN

## 2022-06-09 ENCOUNTER — Ambulatory Visit (INDEPENDENT_AMBULATORY_CARE_PROVIDER_SITE_OTHER): Payer: 59 | Admitting: Internal Medicine

## 2022-06-09 ENCOUNTER — Encounter: Payer: Self-pay | Admitting: Internal Medicine

## 2022-06-09 VITALS — BP 130/64 | HR 101 | Ht 69.0 in | Wt 185.6 lb

## 2022-06-09 DIAGNOSIS — M05741 Rheumatoid arthritis with rheumatoid factor of right hand without organ or systems involvement: Secondary | ICD-10-CM | POA: Diagnosis not present

## 2022-06-09 DIAGNOSIS — F17200 Nicotine dependence, unspecified, uncomplicated: Secondary | ICD-10-CM | POA: Diagnosis not present

## 2022-06-09 DIAGNOSIS — M05742 Rheumatoid arthritis with rheumatoid factor of left hand without organ or systems involvement: Secondary | ICD-10-CM

## 2022-06-09 DIAGNOSIS — L97911 Non-pressure chronic ulcer of unspecified part of right lower leg limited to breakdown of skin: Secondary | ICD-10-CM

## 2022-06-09 DIAGNOSIS — R7302 Impaired glucose tolerance (oral): Secondary | ICD-10-CM | POA: Insufficient documentation

## 2022-06-09 NOTE — Progress Notes (Signed)
New Patient Office Visit  Subjective    Patient ID: [name], male    DOB: 1960-12-19  Age: 62 y.o. MRN: [id number]  CC:  Chief Complaint  Patient presents with   Establish Care    New Patient    HPI [name] presents to establish care Previous Primary Care provider/office:   Patient comes in to establish PMD. He has not been to any doctor and was in in usual state of health until his ED admission on 05/31/2022. About 8 weeks prior to that he developed redness , small ulcers on both his lower legs , and then an itchy rash all over his body. He did not have any fever or chills, no n/v or diarrhea. However the ulcer on his left leg got very big ,so he came to ED. His w/u was negative for DVT , but he had Inguinal lymphadenopathy. The swelling of lower legs attributed to lymphedema and cellulitis. His WBC remained normal. His initial blood glucose was high- but Hgba1c was at 6.5- patient was not aware he was Pre-Diabetic. His CRP, Sed Rate, and RF are high. He does c/o pain and stiffness in joints of both hands. Will set up Rheumatology consult, for auto immune- vasculitis - RA evaluation. He does smoke 1/2 ppd for 30 plus years. He has follow up appointments with Wound Clinic and Podiatry.  PHQ-9/GAD score 1/3. Sleep - 2/10. Will follow up on Inguinal lymphadopathy with u/s.    Outpatient Encounter Medications as of 06/09/2022  Medication Sig   acetaminophen (TYLENOL) 500 MG tablet Take 1 tablet (500 mg total) by mouth every 4 (four) hours as needed for mild pain.   hydrOXYzine (ATARAX) 10 MG tablet Take 1 tablet (10 mg total) by mouth 3 (three) times daily as needed for up to 15 days for itching.   No facility-administered encounter medications on file as of 06/09/2022.    No past medical history on file.  Past Surgical History:  Procedure Laterality Date   fractured right ankle      Family History  Problem Relation Age of Onset   Diabetes Mother    Breast cancer  Mother    Cirrhosis Father     Social History   Socioeconomic History   Marital status: Married    Spouse name: Not on file   Number of children: Not on file   Years of education: Not on file   Highest education level: Not on file  Occupational History   Not on file  Tobacco Use   Smoking status: Every Day    Types: Cigarettes   Smokeless tobacco: Never  Substance and Sexual Activity   Alcohol use: Yes   Drug use: Not on file   Sexual activity: Not on file  Other Topics Concern   Not on file  Social History Narrative   Not on file   Social Determinants of Health   Financial Resource Strain: Not on file  Food Insecurity: No Food Insecurity (05/31/2022)   Hunger Vital Sign    Worried About Running Out of Food in the Last Year: Never true    Ran Out of Food in the Last Year: Never true  Transportation Needs: No Transportation Needs (05/31/2022)   PRAPARE - Hydrologist (Medical): No    Lack of Transportation (Non-Medical): No  Physical Activity: Not on file  Stress: Not on file  Social Connections: Not on file  Intimate Partner Violence: Not At Risk (05/31/2022)  Humiliation, Afraid, Rape, and Kick questionnaire    Fear of Current or Ex-Partner: No    Emotionally Abused: No    Physically Abused: No    Sexually Abused: No    Review of Systems  Constitutional: Negative.  Negative for chills, diaphoresis, fever, malaise/fatigue and weight loss.  HENT: Negative.    Eyes: Negative.   Respiratory: Negative.    Cardiovascular: Negative.   Gastrointestinal: Negative.   Genitourinary: Negative.   Musculoskeletal: Negative.   Skin:  Negative for itching and rash.  Neurological: Negative.   Endo/Heme/Allergies: Negative.   Psychiatric/Behavioral: Negative.          Objective    BP 130/64   Pulse (!) 101   Ht '5\' 9"'$  (1.753 m)   Wt 185 lb 9.6 oz (84.2 kg)   SpO2 98%   BMI 27.41 kg/m   Physical Exam Vitals and nursing note reviewed.   Constitutional:      Appearance: Normal appearance.  HENT:     Right Ear: Tympanic membrane normal.  Cardiovascular:     Rate and Rhythm: Normal rate and regular rhythm.  Pulmonary:     Effort: Pulmonary effort is normal.     Breath sounds: Normal breath sounds.  Abdominal:     General: Abdomen is flat.     Palpations: Abdomen is soft.  Musculoskeletal:        General: Normal range of motion.     Cervical back: Normal range of motion and neck supple.  Neurological:     General: No focal deficit present.     Mental Status: He is alert.  Psychiatric:        Mood and Affect: Mood normal.        Behavior: Behavior normal.        Assessment & Plan:  Patient advised to keep follow up with wound Clinic. Schedule CPE. Problem List Items Addressed This Visit     Rheumatoid arthritis involving both hands with positive rheumatoid factor ([place]) - Primary   Relevant Orders   Ambulatory referral to Rheumatology   Impaired glucose tolerance   Nicotine dependence, uncomplicated    Return in about 4 weeks (around 07/07/2022).   Total time spent: 45 minutes  Perrin Maltese, MD  06/09/2022

## 2022-06-12 ENCOUNTER — Ambulatory Visit: Payer: 59 | Admitting: Internal Medicine

## 2022-06-18 ENCOUNTER — Encounter: Payer: Self-pay | Admitting: Internal Medicine

## 2022-06-18 ENCOUNTER — Ambulatory Visit: Payer: 59 | Admitting: Internal Medicine

## 2022-06-19 ENCOUNTER — Other Ambulatory Visit
Admission: RE | Admit: 2022-06-19 | Discharge: 2022-06-19 | Disposition: A | Discharge status: Home / Self Care | Payer: 59 | Source: Ambulatory Visit | Attending: Physician Assistant | Admitting: Physician Assistant

## 2022-06-19 ENCOUNTER — Encounter: Payer: 59 | Attending: Internal Medicine | Admitting: Physician Assistant

## 2022-06-19 DIAGNOSIS — F17200 Nicotine dependence, unspecified, uncomplicated: Secondary | ICD-10-CM | POA: Diagnosis not present

## 2022-06-19 DIAGNOSIS — B999 Unspecified infectious disease: Secondary | ICD-10-CM | POA: Insufficient documentation

## 2022-06-19 DIAGNOSIS — I87333 Chronic venous hypertension (idiopathic) with ulcer and inflammation of bilateral lower extremity: Secondary | ICD-10-CM | POA: Insufficient documentation

## 2022-06-19 DIAGNOSIS — L97312 Non-pressure chronic ulcer of right ankle with fat layer exposed: Secondary | ICD-10-CM | POA: Insufficient documentation

## 2022-06-19 DIAGNOSIS — E11622 Type 2 diabetes mellitus with other skin ulcer: Secondary | ICD-10-CM | POA: Insufficient documentation

## 2022-06-19 DIAGNOSIS — L97822 Non-pressure chronic ulcer of other part of left lower leg with fat layer exposed: Secondary | ICD-10-CM | POA: Insufficient documentation

## 2022-06-20 NOTE — Progress Notes (Signed)
[name], [name] Lister ([id number]) [id number] Page 1 of 5 Visit Report for 06/19/2022 Abuse Risk Screen Details Patient Name: Date of Service: [name], [name] 06/19/2022 9:45 A M Medical Record Number: [id number] Patient Account Number: [id number] Date of Birth/Sex: Treating RN: [birth year] (62 y.o. [name] Primary Care Geno Sydnor: Lamonte Sakai Other Clinician: Referring Clete Kuch: Treating Hines Kloss/Extender: Judithann Graves NDERSO N, CHELSEY Weeks in Treatment: 0 Abuse Risk Screen Items Answer ABUSE RISK SCREEN: Has anyone close to you tried to hurt or harm you recentlyo No Do you feel uncomfortable with anyone in your familyo No Has anyone forced you do things that you didnt want to doo No Electronic Signature(s) Signed: 06/19/2022 3:58:45 PM By: Levora Dredge Entered By: Levora Dredge on 06/19/2022 10:40:09 -------------------------------------------------------------------------------- Activities of Daily Living Details Patient Name: Date of Service: [name], [name] 06/19/2022 9:45 A M Medical Record Number: [id number] Patient Account Number: [id number] Date of Birth/Sex: Treating RN: [birth year] (62 y.o. [name] Primary Care Taquan Bralley: Lamonte Sakai Other Clinician: Referring Joleah Kosak: Treating Kimbria Camposano/Extender: Judithann Graves NDERSO N, CHELSEY Weeks in Treatment: 0 Activities of Daily Living Items Answer Activities of Daily Living (Please select one for each item) Drive Automobile Completely Able T Medications ake Completely Able Use T elephone Completely Able Care for Appearance Completely Able Use T oilet Completely Able Bath / Shower Completely Able Dress Self Completely Able Feed Self Completely Able Walk Completely Able Get In / Out Bed Completely Able Housework Completely Able [name], [name] ([id number]) [id number] Page 2 of 5 Prepare Meals Completely Able Handle Money Completely Able Shop for Self  Completely Able Electronic Signature(s) Signed: 06/19/2022 3:58:45 PM By: Levora Dredge Entered By: Levora Dredge on 06/19/2022 10:40:41 -------------------------------------------------------------------------------- Education Screening Details Patient Name: Date of Service: [name], [name] 06/19/2022 9:45 A M Medical Record Number: [id number] Patient Account Number: [id number] Date of Birth/Sex: Treating RN: January 06, 1961 (62 y.o. [name] Primary Care Lailani Tool: Lamonte Sakai Other Clinician: Referring Davison Ohms: Treating Briggitte Boline/Extender: Louie Boston, CHELSEY Weeks in Treatment: 0 Learning Preferences/Education Level/Primary Language Learning Preference: Explanation, Demonstration, Video, Communication Board, Printed Material Preferred Language: English Cognitive Barrier Language Barrier: No Translator Needed: No Memory Deficit: No Emotional Barrier: No Cultural/Religious Beliefs Affecting Medical Care: No Physical Barrier Impaired Vision: No Impaired Hearing: No Decreased Hand dexterity: No Knowledge/Comprehension Knowledge Level: High Comprehension Level: High Ability to understand written instructions: High Ability to understand verbal instructions: High Motivation Anxiety Level: Calm Cooperation: Cooperative Education Importance: Acknowledges Need Interest in Health Problems: Asks Questions Perception: Coherent Willingness to Engage in Self-Management High Activities: Readiness to Engage in Self-Management High Activities: Electronic Signature(s) Signed: 06/19/2022 3:58:45 PM By: Levora Dredge Entered By: Levora Dredge on 06/19/2022 10:41:00 [name], [name] ([id number]) [phone] [id number] Page 3 of 5 -------------------------------------------------------------------------------- Fall Risk Assessment Details Patient Name: Date of Service: [name], [name] 06/19/2022 9:45 A M Medical Record Number: [id number] Patient Account  Number: [id number] Date of Birth/Sex: Treating RN: 1960/06/22 (62 y.o. [name] Primary Care Regino Fournet: Lamonte Sakai Other Clinician: Referring Edin Kon: Treating Rocko Fesperman/Extender: Louie Boston, CHELSEY Weeks in Treatment: 0 Fall Risk Assessment Items Have you had 2 or more falls in the last 12 monthso 0 No Have you had any fall that resulted in injury in the last 12 monthso 0 No FALLS RISK SCREEN History of falling - immediate or within 3 months 0 No Secondary diagnosis (Do you have 2 or more medical diagnoseso) 0 No Ambulatory aid None/bed rest/wheelchair/nurse 0 Yes  Crutches/cane/walker 0 No Furniture 0 No Intravenous therapy Access/Saline/Heparin Lock 0 No Gait/Transferring Normal/ bed rest/ wheelchair 0 Yes Weak (short steps with or without shuffle, stooped but able to lift head while walking, may seek 0 No support from furniture) Impaired (short steps with shuffle, may have difficulty arising from chair, head down, impaired 0 No balance) Mental Status Oriented to own ability 0 Yes Electronic Signature(s) Signed: 06/19/2022 3:58:45 PM By: Levora Dredge Entered By: Levora Dredge on 06/19/2022 10:41:11 -------------------------------------------------------------------------------- Foot Assessment Details Patient Name: Date of Service: [name], [name] 06/19/2022 9:45 A M Medical Record Number: [id number] Patient Account Number: [id number] Date of Birth/Sex: Treating RN: 1960-10-26 (62 y.o. [name] Primary Care Shaneca Orne: Lamonte Sakai Other Clinician: Referring Orit Sanville: Treating Chrishana Spargur/Extender: Louie Boston, CHELSEY Weeks in Treatment: 0 Foot Assessment Items Site Locations [place] ([id number]) [id number] Page 4 of 5 + = Sensation present, - = Sensation absent, C = Callus, U = Ulcer R = Redness, W = Warmth, M = Maceration, PU = Pre-ulcerative lesion F = Fissure, S = Swelling, D =  Dryness Assessment Right: Left: Other Deformity: No No Prior Foot Ulcer: No No Prior Amputation: No No Charcot Joint: No No Ambulatory Status: Ambulatory Without Help Gait: Steady Electronic Signature(s) Signed: 06/19/2022 3:58:45 PM By: Levora Dredge Entered By: Levora Dredge on 06/19/2022 10:55:40 -------------------------------------------------------------------------------- Nutrition Risk Screening Details Patient Name: Date of Service: [name], [name] 06/19/2022 9:45 A M Medical Record Number: [id number] Patient Account Number: [id number] Date of Birth/Sex: Treating RN: 10/26/1960 (62 y.o. [name] Primary Care Deveion Denz: Lamonte Sakai Other Clinician: Referring Lorimer Tiberio: Treating Cyrena Kuchenbecker/Extender: Judithann Graves NDERSO N, CHELSEY Weeks in Treatment: 0 Height (in): 70 Weight (lbs): 180 Body Mass Index (BMI): 25.8 Nutrition Risk Screening Items Score Screening NUTRITION RISK SCREEN: I have an illness or condition that made me change the kind and/or amount of food I eat 0 No I eat fewer than two meals per day 0 No I eat few fruits and vegetables, or milk products 0 No I have three or more drinks of beer, liquor or wine almost every day 0 No I have tooth or mouth problems that make it hard for me to eat 0 No I don't always have enough money to buy the food I need 0 No [name], [name] ([id number]) [id number] [id number] Page 5 of 5 I eat alone most of the time 0 No I take three or more different prescribed or over-the-counter drugs a day 0 No Without wanting to, I have lost or gained 10 pounds in the last six months 0 No I am not always physically able to shop, cook and/or feed myself 0 No Nutrition Protocols Good Risk Protocol 0 No interventions needed Moderate Risk Protocol High Risk Proctocol Risk Level: Good Risk Score: 0 Electronic Signature(s) Signed: 06/19/2022 3:58:45 PM By: Levora Dredge Entered By: Levora Dredge on 06/19/2022  10:41:32

## 2022-06-20 NOTE — Progress Notes (Signed)
[name], [name] ([id number]) [id number] Page 1 of 13 Visit Report for 06/19/2022 Chief Complaint Document Details Patient Name: Date of Service: [name], [name] 06/19/2022 9:45 A M Medical Record Number: [id number] Patient Account Number: [id number] Date of Birth/Sex: Treating RN: 06-17-1960 (62 y.o. [name] Primary Care Provider: Lamonte [name] Other Clinician: Referring Provider: Treating Provider/Extender: [name], [name] in Treatment: 0 Information Obtained from: Patient Chief Complaint Bilateral LE Ulcers Electronic Signature(s) Signed: 06/19/2022 11:20:19 AM By: Worthy Keeler PA-C Entered By: Worthy Keeler on 06/19/2022 11:20:19 -------------------------------------------------------------------------------- Debridement Details Patient Name: Date of Service: [name], [name] 06/19/2022 9:45 A M Medical Record Number: [id number] Patient Account Number: [id number] Date of Birth/Sex: Treating RN: May 29, 1960 (62 y.o. [name] Primary Care Provider: Lamonte [name] Other Clinician: Referring Provider: Treating Provider/Extender: [name], [name] in Treatment: 0 Debridement Performed for Assessment: Wound #1 Left,Circumferential Lower Leg Performed By: Physician Tommie Sams., PA-C Debridement Type: Chemical/Enzymatic/Mechanical Agent Used: gauze and vashe Severity of Tissue Pre Debridement: Fat layer exposed Level of Consciousness (Pre-procedure): Awake and Alert Pre-procedure Verification/Time Out Yes - 11:46 Taken: Pain Control: Lidocaine 4% T opical Solution Instrument: Other : gauze and vashe Bleeding: None Response to Treatment: Procedure was tolerated well Level of Consciousness (Post- Awake and Alert procedure): Post Debridement Measurements of Total Wound Length: (cm) 8.5 Width: (cm) 14 [name], [name] ([id number]) [id number] Page 2 of 13 Depth: (cm) 0.1 Volume:  (cm) 9.346 Character of Wound/Ulcer Post Debridement: Stable Severity of Tissue Post Debridement: Fat layer exposed Post Procedure Diagnosis Same as Pre-procedure Electronic Signature(s) Signed: 06/19/2022 3:58:45 PM By: Levora Dredge Signed: 06/19/2022 5:06:45 PM By: Worthy Keeler PA-C Entered By: Levora Dredge on 06/19/2022 11:47:22 -------------------------------------------------------------------------------- Debridement Details Patient Name: Date of Service: [name], [name] 06/19/2022 9:45 A M Medical Record Number: [id number] Patient Account Number: [id number] Date of Birth/Sex: Treating RN: [birth year] (62 y.o. [name] Primary Care Provider: Lamonte [name] Other Clinician: Referring Provider: Treating Provider/Extender: [name], [name] in Treatment: 0 Debridement Performed for Assessment: Wound #2 Right,Lateral Ankle Performed By: Physician Tommie Sams., PA-C Debridement Type: Chemical/Enzymatic/Mechanical Agent Used: gauze and vashe Severity of Tissue Pre Debridement: Fat layer exposed Level of Consciousness (Pre-procedure): Awake and Alert Pre-procedure Verification/Time Out Yes - 11:46 Taken: Pain Control: Lidocaine 4% T opical Solution Instrument: Other : gauze and vashe Bleeding: None Response to Treatment: Procedure was tolerated well Level of Consciousness (Post- Awake and Alert procedure): Post Debridement Measurements of Total Wound Length: (cm) 4.2 Width: (cm) 1.1 Depth: (cm) 0.2 Volume: (cm) 0.726 Character of Wound/Ulcer Post Debridement: Stable Severity of Tissue Post Debridement: Fat layer exposed Post Procedure Diagnosis Same as Pre-procedure Electronic Signature(s) Signed: 06/19/2022 11:54:34 AM By: Levora Dredge Signed: 06/19/2022 5:06:45 PM By: Worthy Keeler PA-C Entered By: Levora Dredge on 06/19/2022 11:54:34 [name], [name] ([id number]) [id number] Page 3 of  13 -------------------------------------------------------------------------------- Debridement Details Patient Name: Date of Service: [name], [name] 06/19/2022 9:45 A M Medical Record Number: [id number] Patient Account Number: [id number] Date of Birth/Sex: Treating RN: 03-06-1961 (62 y.o. [name] Primary Care Provider: Lamonte [name] Other Clinician: Referring Provider: Treating Provider/Extender: [name], [name] in Treatment: 0 Debridement Performed for Assessment: Wound #3 Right,Medial Ankle Performed By: Physician Tommie Sams., PA-C Debridement Type: Chemical/Enzymatic/Mechanical Agent Used: gauze and vashe Severity of Tissue Pre Debridement: Fat layer exposed Level of Consciousness (Pre-procedure): Awake and Alert Pre-procedure Verification/Time  Out Yes - 11:46 Taken: Pain Control: Lidocaine 4% T opical Solution Instrument: Other : gauze and vashe Bleeding: None Response to Treatment: Procedure was tolerated well Level of Consciousness (Post- Awake and Alert procedure): Post Debridement Measurements of Total Wound Length: (cm) 0.5 Width: (cm) 0.5 Depth: (cm) 0.1 Volume: (cm) 0.02 Character of Wound/Ulcer Post Debridement: Stable Severity of Tissue Post Debridement: Fat layer exposed Post Procedure Diagnosis Same as Pre-procedure Electronic Signature(s) Signed: 06/19/2022 11:54:54 AM By: Levora Dredge Signed: 06/19/2022 5:06:45 PM By: Worthy Keeler PA-C Entered By: Levora Dredge on 06/19/2022 11:54:54 -------------------------------------------------------------------------------- HPI Details Patient Name: Date of Service: [name], [name] 06/19/2022 9:45 A M Medical Record Number: [id number] Patient Account Number: [id number] Date of Birth/Sex: Treating RN: 24-Jun-1960 (62 y.o. [name] Primary Care Provider: Lamonte [name] Other Clinician: Referring Provider: Treating Provider/Extender: [name], [name] in  Treatment: 0 [name], [name] ([id number]) [id number] Page 4 of 13 History of Present Illness HPI Description: 06-19-2022 upon evaluation today patient appears to be doing somewhat poorly in regard to a wound that began around Christmas time on the patient's ankle and lower extremities bilaterally. He tells me that he has been having quite significant pain at this point unfortunately. He did go to the emergency department on March 2. However this has been going on since around Christmas. On March 2 he had x-rays that were negative and I did review that today as well. The patient also has what appears to be chronic venous stasis which is an ongoing issue for him here as well and I think that is part of the issue. Right now it is just hard for him to wear anything compression wise although long-term I think he is going to need something. He does smoke currently that something that he needs to stop. Patient does have a history of diabetes mellitus type 2 which is stated to be diet controlled. He also again is a current smoker which I think is something that he needs to get rid of completely. Electronic Signature(s) Signed: 06/19/2022 4:37:15 PM By: Worthy Keeler PA-C Entered By: Worthy Keeler on 06/19/2022 16:37:15 -------------------------------------------------------------------------------- Physical Exam Details Patient Name: Date of Service: [name], [name] 06/19/2022 9:45 A M Medical Record Number: [id number] Patient Account Number: [id number] Date of Birth/Sex: Treating RN: 1960/06/26 (62 y.o. [name] Primary Care Provider: Lamonte [name] Other Clinician: Referring Provider: Treating Provider/Extender: Judithann Graves NDERSO N, [name] in Treatment: 0 Constitutional sitting or standing blood pressure is within target range for patient.. pulse regular and within target range for patient.Marland Kitchen respirations regular, non-labored and within target range for  patient.Marland Kitchen temperature within target range for patient.. Well-nourished and well-hydrated in no acute distress. Eyes conjunctiva clear no eyelid edema noted. pupils equal round and reactive to light and accommodation. Ears, Nose, Mouth, and Throat no gross abnormality of ear auricles or external auditory canals. normal hearing noted during conversation. mucus membranes moist. Respiratory normal breathing without difficulty. Cardiovascular 2+ dorsalis pedis/posterior tibialis pulses. no clubbing, cyanosis, significant edema, <3 sec cap refill. Musculoskeletal normal gait and posture. no significant deformity or arthritic changes, no loss or range of motion, no clubbing. Psychiatric this patient is able to make decisions and demonstrates good insight into disease process. Alert and Oriented x 3. pleasant and cooperative. Notes Upon inspection patient's wound bed actually showed signs of poor granulation at this point in regard to his wounds. I do not see any evidence of active infection systemically  though locally I definitely see signs of erythema and infection is being he was given Keflex in the hospital or in the ER rather at discharge but unfortunately that does not seem to have been beneficial for him as far as getting this completely cleared is concerned I think he still has an active infection. He is no longer on any antibiotics right now I would go ahead and place him on something but I am going obtain a wound culture today as well. Electronic Signature(s) Signed: 06/19/2022 4:37:54 PM By: Worthy Keeler PA-C Entered By: Worthy Keeler on 06/19/2022 16:37:54 [name], [name] ([id number]) [id number] Page 5 of 13 -------------------------------------------------------------------------------- Physician Orders Details Patient Name: Date of Service: [name], [name] 06/19/2022 9:45 A M Medical Record Number: [id number] Patient Account Number: [id number] Date of Birth/Sex:  Treating RN: 1960-05-08 (62 y.o. [name] Primary Care Provider: Lamonte [name] Other Clinician: Referring Provider: Treating Provider/Extender: [name], [name] in Treatment: 0 Verbal / Phone Orders: No Diagnosis Coding ICD-10 Coding Code Description [phone] Chronic venous hypertension (idiopathic) with ulcer and inflammation of bilateral lower extremity L97.312 Non-pressure chronic ulcer of right ankle with fat layer exposed L97.822 Non-pressure chronic ulcer of other part of left lower leg with fat layer exposed F17.208 Nicotine dependence, unspecified, with other nicotine-induced disorders E11.622 Type 2 diabetes mellitus with other skin ulcer Follow-up Appointments Return Appointment in 1 week. Bathing/ Shower/ Hygiene May shower; gently cleanse wound with antibacterial soap, rinse and pat dry prior to dressing wounds No tub bath. Edema Control - Lymphedema / Segmental Compressive Device / Other Bilateral Lower Extremities Tubigrip single layer applied. - size E Elevate, Exercise Daily and A void Standing for Long Periods of Time. Elevate legs to the level of the heart and pump ankles as often as possible Elevate leg(s) parallel to the floor when sitting. DO YOUR BEST to sleep in the bed at night. DO NOT sleep in your recliner. Long hours of sitting in a recliner leads to swelling of the legs and/or potential wounds on your backside. Medications-Please add to medication list. ntibiotics - please pick up and take as prescribed P.O. A Wound Treatment Wound #1 - Lower Leg Wound Laterality: Left, Circumferential Cleanser: Byram Ancillary Kit - 15 Day Supply (DME) (Generic) 3 x Per Week/30 Days Discharge Instructions: Use supplies as instructed; Kit contains: (15) Saline Bullets; (15) 3x3 Gauze; 15 pr Gloves Cleanser: Soap and Water 3 x Per Week/30 Days Discharge Instructions: Gently cleanse wound with antibacterial soap, rinse and pat dry prior to  dressing wounds Cleanser: Vashe 5.8 (oz) 3 x Per Week/30 Days Discharge Instructions: Use vashe 5.8 (oz) as directed Prim Dressing: Silvercel 4 1/4x 4 1/4 (in/in) (DME) (Generic) 3 x Per Week/30 Days ary Discharge Instructions: Apply Silvercel 4 1/4x 4 1/4 (in/in) as instructed Secondary Dressing: ABD Pad 5x9 (in/in) (DME) (Generic) 3 x Per Week/30 Days Discharge Instructions: Cover with ABD pad Secured With: Medipore T - 64M Medipore H Soft Cloth Surgical T ape ape, 2x2 (in/yd) (DME) (Generic) 3 x Per Week/30 Days Secured With: [employer] Sterile or Non-Sterile 6-ply 4.5x4 (yd/yd) (DME) (Generic) 3 x Per Week/30 Days Discharge Instructions: Apply Kerlix as directed Secured With: Tubigrip Size E, 3.5x10 (in/yds) 3 x Per Week/30 Days Discharge Instructions: Apply 3 Tubigrip E 3-finger-widths below knee to base of toes to secure dressing and/or for swelling. [name], [name] ([id number]) [id number] Page 6 of 13 Wound #2 - Ankle Wound Laterality: Right, Lateral Cleanser: Byram Ancillary Kit -  15 Day Supply (Generic) 3 x Per Week/30 Days Discharge Instructions: Use supplies as instructed; Kit contains: (15) Saline Bullets; (15) 3x3 Gauze; 15 pr Gloves Cleanser: Soap and Water 3 x Per Week/30 Days Discharge Instructions: Gently cleanse wound with antibacterial soap, rinse and pat dry prior to dressing wounds Cleanser: Vashe 5.8 (oz) 3 x Per Week/30 Days Discharge Instructions: Use vashe 5.8 (oz) as directed Prim Dressing: Silvercel Small 2x2 (in/in) (DME) (Generic) 3 x Per Week/30 Days ary Discharge Instructions: Apply Silvercel Small 2x2 (in/in) as instructed Secondary Dressing: ABD Pad 5x9 (in/in) (DME) (Generic) 3 x Per Week/30 Days Discharge Instructions: Cover with ABD pad Secured With: Medipore T - 29M Medipore H Soft Cloth Surgical T ape ape, 2x2 (in/yd) (DME) (Generic) 3 x Per Week/30 Days Secured With: [employer] Sterile or Non-Sterile 6-ply 4.5x4 (yd/yd) (DME)  (Generic) 3 x Per Week/30 Days Discharge Instructions: Apply Kerlix as directed Secured With: Tubigrip Size E, 3.5x10 (in/yds) 3 x Per Week/30 Days Discharge Instructions: Apply 3 Tubigrip E 3-finger-widths below knee to base of toes to secure dressing and/or for swelling. Wound #3 - Ankle Wound Laterality: Right, Medial Cleanser: Byram Ancillary Kit - 15 Day Supply (Generic) 3 x Per Week/30 Days Discharge Instructions: Use supplies as instructed; Kit contains: (15) Saline Bullets; (15) 3x3 Gauze; 15 pr Gloves Cleanser: Soap and Water 3 x Per Week/30 Days Discharge Instructions: Gently cleanse wound with antibacterial soap, rinse and pat dry prior to dressing wounds Cleanser: Vashe 5.8 (oz) 3 x Per Week/30 Days Discharge Instructions: Use vashe 5.8 (oz) as directed Prim Dressing: Silvercel Small 2x2 (in/in) (DME) (Generic) 3 x Per Week/30 Days ary Discharge Instructions: Apply Silvercel Small 2x2 (in/in) as instructed Secondary Dressing: ABD Pad 5x9 (in/in) (DME) (Generic) 3 x Per Week/30 Days Discharge Instructions: Cover with ABD pad Secured With: Medipore T - 29M Medipore H Soft Cloth Surgical T ape ape, 2x2 (in/yd) (DME) (Generic) 3 x Per Week/30 Days Secured With: [employer] Sterile or Non-Sterile 6-ply 4.5x4 (yd/yd) (DME) (Generic) 3 x Per Week/30 Days Discharge Instructions: Apply Kerlix as directed Secured With: Tubigrip Size E, 3.5x10 (in/yds) 3 x Per Week/30 Days Discharge Instructions: Apply 3 Tubigrip E 3-finger-widths below knee to base of toes to secure dressing and/or for swelling. Laboratory Bacteria identified in Wound by Culture (MICRO) - left lower leg wound LOINC Code: [id number] Convenience Name: Wound culture routine Patient Medications llergies: No Known Drug Allergies A Notifications Medication Indication Start End 06/19/2022 Bactrim DS DOSE 1 - oral 800 mg-160 mg tablet - 1 tablet oral twice a day x 14 days Electronic Signature(s) Signed: 06/19/2022 4:39:16 PM By:  Worthy Keeler PA-C Previous Signature: 06/19/2022 3:58:45 PM Version By: Levora Dredge Entered By: Worthy Keeler on 06/19/2022 16:39:16 Wasilewski, [name] ([id number]) [id number] Page 7 of 13 -------------------------------------------------------------------------------- Problem List Details Patient Name: Date of Service: [name], [name] 06/19/2022 9:45 A M Medical Record Number: [id number] Patient Account Number: [id number] Date of Birth/Sex: Treating RN: Aug 23, 1960 (62 y.o. [name] Primary Care Provider: Lamonte [name] Other Clinician: Referring Provider: Treating Provider/Extender: [name], [name] in Treatment: 0 Active Problems ICD-10 Encounter Code Description Active Date MDM Diagnosis I87.333 Chronic venous hypertension (idiopathic) with ulcer and inflammation of 06/19/2022 No Yes bilateral lower extremity L97.312 Non-pressure chronic ulcer of right ankle with fat layer exposed 06/19/2022 No Yes L97.822 Non-pressure chronic ulcer of other part of left lower leg with fat layer exposed3/21/2024 No Yes F17.208 Nicotine dependence, unspecified, with other  nicotine-induced disorders 06/19/2022 No Yes E11.622 Type 2 diabetes mellitus with other skin ulcer 06/19/2022 No Yes Inactive Problems Resolved Problems Electronic Signature(s) Signed: 06/19/2022 11:19:58 AM By: Worthy Keeler PA-C Entered By: Worthy Keeler on 06/19/2022 11:19:57 -------------------------------------------------------------------------------- Progress Note Details Patient Name: Date of Service: [name], [name] 06/19/2022 9:45 A M Medical Record Number: [id number] Patient Account Number: [id number] Date of Birth/Sex: Treating RN: 1960-10-05 (62 y.o. [name] [place], [name] ([id number]) [id number] Page 8 of 13 Primary Care Provider: Lamonte [name] Other Clinician: Referring Provider: Treating Provider/Extender: [name], [name] in Treatment: 0 Subjective Chief Complaint Information obtained from Patient Bilateral LE Ulcers History of Present Illness (HPI) 06-19-2022 upon evaluation today patient appears to be doing somewhat poorly in regard to a wound that began around Christmas time on the patient's ankle and lower extremities bilaterally. He tells me that he has been having quite significant pain at this point unfortunately. He did go to the emergency department on March 2. However this has been going on since around Christmas. On March 2 he had x-rays that were negative and I did review that today as well. The patient also has what appears to be chronic venous stasis which is an ongoing issue for him here as well and I think that is part of the issue. Right now it is just hard for him to wear anything compression wise although long-term I think he is going to need something. He does smoke currently that something that he needs to stop. Patient does have a history of diabetes mellitus type 2 which is stated to be diet controlled. He also again is a current smoker which I think is something that he needs to get rid of completely. Patient History Information obtained from Patient. Allergies No Known Drug Allergies Social History Current every day smoker, Alcohol Use - Rarely, Drug Use - Prior History, Caffeine Use - Daily. Medical History Musculoskeletal Patient has history of Rheumatoid Arthritis Medical A Surgical History Notes nd Endocrine pre diabetic per pt, takes no medications, no glucose checks Review of Systems (ROS) Constitutional Symptoms (General Health) Denies complaints or symptoms of Fatigue, Fever, Chills, Marked Weight Change. Eyes Denies complaints or symptoms of Dry Eyes, Vision Changes, Glasses / Contacts. Ear/Nose/Mouth/Throat Denies complaints or symptoms of Difficult clearing ears, Sinusitis. Hematologic/Lymphatic Denies complaints or symptoms of Bleeding / Clotting  Disorders, Human Immunodeficiency Virus. Respiratory Denies complaints or symptoms of Chronic or frequent coughs, Shortness of Breath. Cardiovascular Denies complaints or symptoms of Chest pain, LE edema. Gastrointestinal Denies complaints or symptoms of Frequent diarrhea, Nausea, Vomiting. Genitourinary Denies complaints or symptoms of Kidney failure/ Dialysis, Incontinence/dribbling. Immunological Denies complaints or symptoms of Hives, Itching. Integumentary (Skin) Denies complaints or symptoms of Wounds, Bleeding or bruising tendency, Breakdown, Swelling. Neurologic Complains or has symptoms of Numbness/parasthesias - fingers and feet at times, Focal/Weakness - bilat fingers. Psychiatric Denies complaints or symptoms of Anxiety, Claustrophobia. Objective Constitutional sitting or standing blood pressure is within target range for patient.. pulse regular and within target range for patient.Marland Kitchen respirations regular, non-labored and within target range for patient.Marland Kitchen temperature within target range for patient.. Well-nourished and well-hydrated in no acute distress. Vitals Time Taken: 10:35 AM, Height: 70 in, Source: Stated, Weight: 180 lbs, Source: Stated, BMI: 25.8, Temperature: 97.8 F, Pulse: 80 bpm, Respiratory Rate: 18 breaths/min, Blood Pressure: 135/90 mmHg. Eyes conjunctiva clear no eyelid edema noted. pupils equal round and reactive to light and accommodation. Ears, Nose, Mouth, and Throat [name],  [name] ([id number]) [id number] Page 9 of 13 no gross abnormality of ear auricles or external auditory canals. normal hearing noted during conversation. mucus membranes moist. Respiratory normal breathing without difficulty. Cardiovascular 2+ dorsalis pedis/posterior tibialis pulses. no clubbing, cyanosis, significant edema, Musculoskeletal normal gait and posture. no significant deformity or arthritic changes, no loss or range of motion, no  clubbing. Psychiatric this patient is able to make decisions and demonstrates good insight into disease process. Alert and Oriented x 3. pleasant and cooperative. General Notes: Upon inspection patient's wound bed actually showed signs of poor granulation at this point in regard to his wounds. I do not see any evidence of active infection systemically though locally I definitely see signs of erythema and infection is being he was given Keflex in the hospital or in the ER rather at discharge but unfortunately that does not seem to have been beneficial for him as far as getting this completely cleared is concerned I think he still has an active infection. He is no longer on any antibiotics right now I would go ahead and place him on something but I am going obtain a wound culture today as well. Integumentary (Hair, Skin) Wound #1 status is Open. Original cause of wound was Other Lesion. The date acquired was: 03/24/2022. The wound is located on the Left,Circumferential Lower Leg. The wound measures 8.5cm length x 14cm width x 0.1cm depth; 93.462cm^2 area and 9.346cm^3 volume. There is Fat Layer (Subcutaneous Tissue) exposed. There is no tunneling or undermining noted. There is a medium amount of serosanguineous drainage noted. There is medium (34-66%) red, pink granulation within the wound bed. There is a medium (34-66%) amount of necrotic tissue within the wound bed including Adherent Slough. Wound #2 status is Open. Original cause of wound was Gradually Appeared. The date acquired was: 03/24/2022. The wound is located on the Right,Lateral Ankle. The wound measures 4.2cm length x 1.1cm width x 0.2cm depth; 3.629cm^2 area and 0.726cm^3 volume. There is Fat Layer (Subcutaneous Tissue) exposed. There is no tunneling or undermining noted. There is a medium amount of serosanguineous drainage noted. There is small (1-33%) red, pink granulation within the wound bed. There is a large (67-100%) amount of  necrotic tissue within the wound bed including Adherent Slough. Wound #3 status is Open. Original cause of wound was Gradually Appeared. The date acquired was: 03/24/2022. The wound is located on the Right,Medial Ankle. The wound measures 0.5cm length x 0.5cm width x 0.1cm depth; 0.196cm^2 area and 0.02cm^3 volume. There is no tunneling or undermining noted. There is a medium amount of serosanguineous drainage noted. There is no granulation within the wound bed. There is a large (67-100%) amount of necrotic tissue within the wound bed including Adherent Slough. 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 There was a Chemical/Enzymatic/Mechanical debridement performed by Tommie Sams., PA-C. With the following instrument(s): gauze and vashe to remove Non-Viable tissue/material. after achieving pain control using Lidocaine 4% T opical Solution. Other agent used was gauze and vashe. A time out was conducted at 11:46, prior to the start  of the procedure. There was no bleeding. The procedure was tolerated well. Post Debridement Measurements: 8.5cm length x 14cm width x 0.1cm depth; 9.346cm^3 volume. Character of Wound/Ulcer Post Debridement is stable. Severity of Tissue Post Debridement is: Fat layer exposed. Post procedure Diagnosis Wound #1: Same as Pre-Procedure Wound #2 Pre-procedure diagnosis of Wound #2 is a Venous Leg Ulcer located on the Right,Lateral Ankle  .Severity of Tissue Pre Debridement is: Fat layer exposed. There was a Selective/Open Wound Non-Viable Tissue Chemical/Enzymatic/Mechanical performed by Tommie Sams., PA-C. With the following instrument(s): gauze and vashe to remove Non-Viable tissue/material. after achieving pain control using Lidocaine 4% T opical Solution. Other agent used was gauze and vashe. A time out was conducted at 11:46, prior to the start of the procedure. There was no bleeding. The procedure was tolerated well. Post Debridement Measurements: 4.2cm length x 1.1cm width x 0.2cm depth; 0.726cm^3 volume. Character of Wound/Ulcer Post Debridement is stable. Severity of Tissue Post Debridement is: Fat layer exposed. Post procedure Diagnosis Wound #2: Same as Pre-Procedure Wound #3 Pre-procedure diagnosis of Wound #3 is a Venous Leg Ulcer located on the Right,Medial Ankle .Severity of Tissue Pre Debridement is: Fat layer exposed. There was a Selective/Open Wound Non-Viable Tissue Chemical/Enzymatic/Mechanical performed by Tommie Sams., PA-C. With the following instrument(s): gauze and vashe to remove Non-Viable tissue/material. after achieving pain control using Lidocaine 4% T opical Solution. Other agent used was gauze and vashe. A time out was conducted at 11:46, prior to the start of the procedure. There was no bleeding. The procedure was tolerated well. Post Debridement Measurements: 0.5cm length x 0.5cm width x 0.1cm depth; 0.02cm^3 volume. Character of Wound/Ulcer Post Debridement is stable. Severity of Tissue Post Debridement is: Fat layer exposed. Post procedure Diagnosis Wound #3: Same as Pre-Procedure [name], [name] ([id number]) [id number] Page 10 of 13 Plan Follow-up Appointments: Return Appointment in 1 week. Bathing/ Shower/ Hygiene: May shower; gently cleanse wound with antibacterial soap, rinse and pat dry prior to dressing wounds No tub bath. Edema Control - Lymphedema /  Segmental Compressive Device / Other: Tubigrip single layer applied. - size E Elevate, Exercise Daily and Avoid Standing for Long Periods of Time. Elevate legs to the level of the heart and pump ankles as often as possible Elevate leg(s) parallel to the floor when sitting. DO YOUR BEST to sleep in the bed at night. DO NOT sleep in your recliner. Long hours of sitting in a recliner leads to swelling of the legs and/or potential wounds on your backside. Medications-Please add to medication list.: P.O. Antibiotics - please pick up and take as prescribed Laboratory ordered were: Wound culture routine - left lower leg wound The following medication(s) was prescribed: Bactrim DS oral 800 mg-160 mg tablet 1 1 tablet oral twice a day x 14 days starting 06/19/2022 WOUND #1: - Lower Leg Wound Laterality: Left, Circumferential Cleanser: [employer] Ancillary Kit - 15 Day Supply (DME) (Generic) 3 x Per Week/30 Days Discharge Instructions: Use supplies as instructed; Kit contains: (15) Saline Bullets; (15) 3x3 Gauze; 15 pr Gloves Cleanser: Soap and Water 3 x Per Week/30 Days Discharge Instructions: Gently cleanse wound with antibacterial soap, rinse and pat dry prior to dressing wounds Cleanser: Vashe 5.8 (oz) 3 x Per Week/30 Days Discharge Instructions: Use vashe 5.8 (oz) as directed Prim Dressing: Silvercel 4 1/4x 4 1/4 (in/in) (DME) (Generic) 3 x Per Week/30 Days ary Discharge Instructions: Apply Silvercel 4 1/4x 4 1/4 (in/in) as instructed Secondary Dressing: ABD Pad 5x9 (in/in) (DME) (Generic) 3  x Per Week/30 Days Discharge Instructions: Cover with ABD pad Secured With: Medipore T - 51M Medipore H Soft Cloth Surgical T ape ape, 2x2 (in/yd) (DME) (Generic) 3 x Per Week/30 Days Secured With: [employer] Sterile or Non-Sterile 6-ply 4.5x4 (yd/yd) (DME) (Generic) 3 x Per Week/30 Days Discharge Instructions: Apply Kerlix as directed Secured With: Tubigrip Size E, 3.5x10 (in/yds) 3 x Per Week/30  Days Discharge Instructions: Apply 3 Tubigrip E 3-finger-widths below knee to base of toes to secure dressing and/or for swelling. WOUND #2: - Ankle Wound Laterality: Right, Lateral Cleanser: [employer] Ancillary Kit - 15 Day Supply (Generic) 3 x Per Week/30 Days Discharge Instructions: Use supplies as instructed; Kit contains: (15) Saline Bullets; (15) 3x3 Gauze; 15 pr Gloves Cleanser: Soap and Water 3 x Per Week/30 Days Discharge Instructions: Gently cleanse wound with antibacterial soap, rinse and pat dry prior to dressing wounds Cleanser: Vashe 5.8 (oz) 3 x Per Week/30 Days Discharge Instructions: Use vashe 5.8 (oz) as directed Prim Dressing: Silvercel Small 2x2 (in/in) (DME) (Generic) 3 x Per Week/30 Days ary Discharge Instructions: Apply Silvercel Small 2x2 (in/in) as instructed Secondary Dressing: ABD Pad 5x9 (in/in) (DME) (Generic) 3 x Per Week/30 Days Discharge Instructions: Cover with ABD pad Secured With: Medipore T - 51M Medipore H Soft Cloth Surgical T ape ape, 2x2 (in/yd) (DME) (Generic) 3 x Per Week/30 Days Secured With: [employer] Sterile or Non-Sterile 6-ply 4.5x4 (yd/yd) (DME) (Generic) 3 x Per Week/30 Days Discharge Instructions: Apply Kerlix as directed Secured With: Tubigrip Size E, 3.5x10 (in/yds) 3 x Per Week/30 Days Discharge Instructions: Apply 3 Tubigrip E 3-finger-widths below knee to base of toes to secure dressing and/or for swelling. WOUND #3: - Ankle Wound Laterality: Right, Medial Cleanser: [employer] Ancillary Kit - 15 Day Supply (Generic) 3 x Per Week/30 Days Discharge Instructions: Use supplies as instructed; Kit contains: (15) Saline Bullets; (15) 3x3 Gauze; 15 pr Gloves Cleanser: Soap and Water 3 x Per Week/30 Days Discharge Instructions: Gently cleanse wound with antibacterial soap, rinse and pat dry prior to dressing wounds Cleanser: Vashe 5.8 (oz) 3 x Per Week/30 Days Discharge Instructions: Use vashe 5.8 (oz) as directed Prim Dressing: Silvercel Small 2x2  (in/in) (DME) (Generic) 3 x Per Week/30 Days ary Discharge Instructions: Apply Silvercel Small 2x2 (in/in) as instructed Secondary Dressing: ABD Pad 5x9 (in/in) (DME) (Generic) 3 x Per Week/30 Days Discharge Instructions: Cover with ABD pad Secured With: Medipore T - 51M Medipore H Soft Cloth Surgical T ape ape, 2x2 (in/yd) (DME) (Generic) 3 x Per Week/30 Days Secured With: [employer] Sterile or Non-Sterile 6-ply 4.5x4 (yd/yd) (DME) (Generic) 3 x Per Week/30 Days Discharge Instructions: Apply Kerlix as directed Secured With: Tubigrip Size E, 3.5x10 (in/yds) 3 x Per Week/30 Days Discharge Instructions: Apply 3 Tubigrip E 3-finger-widths below knee to base of toes to secure dressing and/or for swelling. 1. I am going to recommend currently based on what I am seeing that we go ahead and initiate treatment with a course of antibiotics. I am going to send this into the pharmacy for him. Specifically this is going to be Bactrim DS which I think is a very good option here. 2. I am also can recommend that we have the patient continue to monitor for any signs of infection or worsening. I am hopeful that with what we have going on currently and that the culture as a backup we should be able to get this under control. 3. With regard to the compression I would recommend Tubigrip  size E for him. With regard to the dressing working to be using silver cell which I think is probably the best thing right now. We will see patient back for reevaluation in 1 week here in the clinic. If anything worsens or changes patient will contact our office for additional recommendations. Electronic Signature(s) Signed: 06/19/2022 4:41:44 PM By: Worthy Keeler PA-C Entered By: Worthy Keeler on 06/19/2022 16:41:43 [name], [name] ([id number]) [id number] Page 11 of 13 -------------------------------------------------------------------------------- ROS/PFSH Details Patient Name: Date of Service: PO  PE, [name] 06/19/2022 9:45 A M Medical Record Number: [id number] Patient Account Number: [id number] Date of Birth/Sex: Treating RN: 1961/01/14 (62 y.o. [name] Primary Care Provider: Lamonte [name] Other Clinician: Referring Provider: Treating Provider/Extender: Judithann Graves NDERSO N, [name] in Treatment: 0 Information Obtained From Patient Constitutional Symptoms (General Health) Complaints and Symptoms: Negative for: Fatigue; Fever; Chills; Marked Weight Change Eyes Complaints and Symptoms: Negative for: Dry Eyes; Vision Changes; Glasses / Contacts Ear/Nose/Mouth/Throat Complaints and Symptoms: Negative for: Difficult clearing ears; Sinusitis Hematologic/Lymphatic Complaints and Symptoms: Negative for: Bleeding / Clotting Disorders; Human Immunodeficiency Virus Respiratory Complaints and Symptoms: Negative for: Chronic or frequent coughs; Shortness of Breath Cardiovascular Complaints and Symptoms: Negative for: Chest pain; LE edema Gastrointestinal Complaints and Symptoms: Negative for: Frequent diarrhea; Nausea; Vomiting Genitourinary Complaints and Symptoms: Negative for: Kidney failure/ Dialysis; Incontinence/dribbling Immunological Complaints and Symptoms: Negative for: Hives; Itching Integumentary (Skin) Complaints and Symptoms: Negative for: Wounds; Bleeding or bruising tendency; Breakdown; Swelling Neurologic Complaints and Symptoms: Positive for: Numbness/parasthesias - fingers and feet at times; Focal/Weakness - bilat fingers [name], [name] ([id number]) [phone].pdf Page 12 of 13 Psychiatric Complaints and Symptoms: Negative for: Anxiety; Claustrophobia Endocrine Medical History: Past Medical History Notes: pre diabetic per pt, takes no medications, no glucose checks Musculoskeletal Medical History: Positive for: Rheumatoid Arthritis Oncologic Immunizations Pneumococcal Vaccine: Received Pneumococcal Vaccination:  No Implantable Devices None Family and Social History Current every day smoker; Alcohol Use: Rarely; Drug Use: Prior History; Caffeine Use: Daily Electronic Signature(s) Signed: 06/19/2022 3:58:45 PM By: Levora Dredge Signed: 06/19/2022 5:06:45 PM By: Worthy Keeler PA-C Entered By: Levora Dredge on 06/19/2022 10:40:02 -------------------------------------------------------------------------------- SuperBill Details Patient Name: Date of Service: [name], [name] 06/19/2022 Medical Record Number: [id number] Patient Account Number: [id number] Date of Birth/Sex: Treating RN: 1960/08/25 (62 y.o. 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 Facility Procedures : [name], [name] Code: [id number] CK ([id number]) Description: 99213 - WOUND CARE VISIT-LEV 3 EST PT [phone] Modifier: [id number] Quantity: 1 [id number].pdf Page 13 of 13 Physician Procedures : CPT4 Code Description Modifier [id number] - WC PHYS LEVEL 4 - NEW PT ICD-10 Diagnosis Description I87.333 Chronic venous hypertension (idiopathic) with ulcer and inflammation of bilateral lower extremity L97.312 Non-pressure chronic ulcer of right  ankle with fat layer exposed L97.822 Non-pressure chronic ulcer of other part of left lower leg with fat layer exposed F17.208 Nicotine dependence, unspecified, with other nicotine-induced disorders Quantity:  1 Electronic Signature(s) Signed: 06/19/2022 4:42:25 PM By: Worthy Keeler PA-C Previous Signature: 06/19/2022 12:25:16 PM Version  By: Levora Dredge Entered By: Worthy Keeler on 06/19/2022 16:42:25

## 2022-06-20 NOTE — Progress Notes (Signed)
[name], [name] ([id number]) [id number] Page 1 of 10 Visit Report for 06/19/2022 Allergy List Details Patient Name: Date of Service: [name], [name] 06/19/2022 9:45 A M Medical Record Number: [id number] Patient Account Number: [id number] Date of Birth/Sex: Treating RN: 1960/06/15 (62 y.o. [name] Primary Care Karee Forge: Lamonte Sakai Other Clinician: Referring Reid Nawrot: Treating Kaylee Wombles/Extender: Judithann Graves NDERSO N, CHELSEY Weeks in Treatment: 0 Allergies Active Allergies No Known Drug Allergies Allergy Notes Electronic Signature(s) Signed: 06/19/2022 3:58:45 PM By: Levora Dredge Entered By: Levora Dredge on 06/19/2022 10:36:45 -------------------------------------------------------------------------------- Arrival Information Details Patient Name: Date of Service: [name], [name] 06/19/2022 9:45 A M Medical Record Number: [id number] Patient Account Number: [id number] Date of Birth/Sex: Treating RN: [birth year] (62 y.o. [name] Primary Care Lucienne Sawyers: Lamonte Sakai Other Clinician: Referring Deunte Bledsoe: Treating Ozzy Bohlken/Extender: Louie Boston, CHELSEY Weeks in Treatment: 0 Visit Information Patient Arrived: Ambulatory Arrival Time: 10:34 Accompanied By: self Transfer Assistance: None Patient Identification Verified: Yes Secondary Verification Process Completed: Yes Electronic Signature(s) Signed: 06/19/2022 3:58:45 PM By: Levora Dredge Entered By: Levora Dredge on 06/19/2022 10:34:39 Detore, Barnabas Lister ([id number] Page 2 of 10 -------------------------------------------------------------------------------- Clinic Level of Care Assessment Details Patient Name: Date of Service: [name], [name] 06/19/2022 9:45 A M Medical Record Number: [id number] Patient Account Number: [id number] Date of Birth/Sex: Treating RN: 20-Aug-1960 (62 y.o. [name] Primary Care [place] Festa: Lamonte Sakai Other  Clinician: Referring Evoleth Nordmeyer: Treating Norely Schlick/Extender: Judithann Graves NDERSO N, CHELSEY Weeks in Treatment: 0 Clinic Level of Care Assessment Items TOOL 1 Quantity Score []  - 0 Use when EandM and Procedure is performed on INITIAL visit ASSESSMENTS - Nursing Assessment / Reassessment X- 1 20 General Physical Exam (combine w/ comprehensive assessment (listed just below) when performed on new pt. evals) X- 1 25 Comprehensive Assessment (HX, ROS, Risk Assessments, Wounds Hx, etc.) ASSESSMENTS - Wound and Skin Assessment / Reassessment []  - 0 Dermatologic / Skin Assessment (not related to wound area) ASSESSMENTS - Ostomy and/or Continence Assessment and Care []  - 0 Incontinence Assessment and Management []  - 0 Ostomy Care Assessment and Management (repouching, etc.) PROCESS - Coordination of Care X - Simple Patient / Family Education for ongoing care 1 15 []  - 0 Complex (extensive) Patient / Family Education for ongoing care X- 1 10 Staff obtains Programmer, systems, Records, T Results / Process Orders est X- 1 10 Staff telephones HHA, Nursing Homes / Clarify orders / etc []  - 0 Routine Transfer to another Facility (non-emergent condition) []  - 0 Routine Hospital Admission (non-emergent condition) X- 1 15 New Admissions / Biomedical engineer / Ordering NPWT Apligraf, etc. , []  - 0 Emergency Hospital Admission (emergent condition) PROCESS - Special Needs []  - 0 Pediatric / Minor Patient Management []  - 0 Isolation Patient Management []  - 0 Hearing / Language / Visual special needs []  - 0 Assessment of Community assistance (transportation, D/C planning, etc.) []  - 0 Additional assistance / Altered mentation []  - 0 Support Surface(s) Assessment (bed, cushion, seat, etc.) INTERVENTIONS - Miscellaneous []  - 0 External ear exam []  - 0 Patient Transfer (multiple staff / Civil Service fast streamer / Similar devices) []  - 0 Simple Staple / Suture removal (25 or less) []  - 0 Complex Staple  / Suture removal (26 or more) []  - 0 Hypo/Hyperglycemic Management (do not check if billed separately) X- 1 15 Ankle / Brachial Index (ABI) - do not check if billed separately [name], [name] ([id number] Page 3 of 10 Has the patient been seen at  the hospital within the last three years: Yes Total Score: 110 Level Of Care: New/Established - Level 3 Electronic Signature(s) Signed: 06/19/2022 3:58:45 PM By: Levora Dredge Entered By: Levora Dredge on 06/19/2022 12:25:06 -------------------------------------------------------------------------------- Encounter Discharge Information Details Patient Name: Date of Service: [name], [name] 06/19/2022 9:45 A M Medical Record Number: [id number] Patient Account Number: [id number] Date of Birth/Sex: Treating RN: 04-Jul-1960 (62 y.o. [name] Primary Care Aaniya Sterba: Lamonte Sakai Other Clinician: Referring Ansar Skoda: Treating Jadden Yim/Extender: Louie Boston, CHELSEY Weeks in Treatment: 0 Encounter Discharge Information Items Post Procedure Vitals Discharge Condition: Stable Temperature (F): 97.5 Ambulatory Status: Ambulatory Pulse (bpm): 80 Discharge Destination: Home Respiratory Rate (breaths/min): 18 Transportation: Private Auto Blood Pressure (mmHg): 135/90 Accompanied By: self Schedule Follow-up Appointment: Yes Clinical Summary of Care: Electronic Signature(s) Signed: 06/19/2022 12:28:13 PM By: Levora Dredge Entered By: Levora Dredge on 06/19/2022 12:28:13 -------------------------------------------------------------------------------- Lower Extremity Assessment Details Patient Name: Date of Service: [name], [name] 06/19/2022 9:45 A M Medical Record Number: [id number] Patient Account Number: [id number] Date of Birth/Sex: Treating RN: Dec 18, 1960 (62 y.o. [name] Primary Care Tangy Drozdowski: Lamonte Sakai Other Clinician: Referring Ivah Girardot: Treating Avelyn Touch/Extender: Judithann Graves NDERSO N, CHELSEY Weeks in Treatment: 0 Edema Assessment Assessed: [Left: No] [Right: No] Edema: [Left: Yes] [Right: Yes] Calf Left: Right: Point of Measurement: 33 cm From Medial Instep 36.2 cm 38 cm Holford, Gyasi ([id number]) [id number] Page 4 of 10 Ankle Left: Right: Point of Measurement: 12 cm From Medial Instep 23 cm 23.7 cm Vascular Assessment Pulses: Dorsalis Pedis Palpable: [Left:Yes] [Right:Yes] Posterior Tibial Palpable: [Left:Yes] [Right:Yes] Doppler Audible: [Left:Yes] [Right:Yes] Blood Pressure: Brachial: [Left:122] [Right:122] Ankle: [Left:Dorsalis Pedis: 165 1.35] [Right:Dorsalis Pedis: 164 1.34] Electronic Signature(s) Signed: 06/19/2022 11:14:15 AM By: Levora Dredge Entered By: Levora Dredge on 06/19/2022 11:14:15 -------------------------------------------------------------------------------- Multi-Disciplinary Care Plan Details Patient Name: Date of Service: [name], [name] 06/19/2022 9:45 A M Medical Record Number: [id number] Patient Account Number: [id number] Date of Birth/Sex: Treating RN: 1960-09-05 (62 y.o. [name] Primary Care Jahseh Lucchese: Lamonte Sakai Other Clinician: Referring Rollin Kotowski: Treating Edla Para/Extender: Louie Boston, CHELSEY Weeks in Treatment: 0 Active Inactive Orientation to the Wound Care Program Nursing Diagnoses: Knowledge deficit related to the wound healing center program Goals: Patient/caregiver will verbalize understanding of the [facility] Date Initiated: 06/19/2022 Target Resolution Date: 06/26/2022 Goal Status: Active Interventions: Provide education on orientation to the wound center Notes: Wound/Skin Impairment Nursing Diagnoses: Impaired tissue integrity Knowledge deficit related to ulceration/compromised skin integrity Goals: Ulcer/skin breakdown will have a volume reduction of 30% by week 4 Date Initiated: 06/19/2022 Target Resolution Date:  07/17/2022 Goal Status: Active Ulcer/skin breakdown will have a volume reduction of 50% by week 8 Fennema, Olumide ([id number] Page 5 of 10 Date Initiated: 06/19/2022 Target Resolution Date: 08/14/2022 Goal Status: Active Ulcer/skin breakdown will have a volume reduction of 80% by week 12 Date Initiated: 06/19/2022 Target Resolution Date: 09/11/2022 Goal Status: Active Ulcer/skin breakdown will heal within 14 weeks Date Initiated: 06/19/2022 Target Resolution Date: 09/25/2022 Goal Status: Active Interventions: Assess patient/caregiver ability to obtain necessary supplies Assess patient/caregiver ability to perform ulcer/skin care regimen upon admission and as needed Assess ulceration(s) every visit Provide education on ulcer and skin care Treatment Activities: Referred to DME Ariann Khaimov for dressing supplies : 06/19/2022 Skin care regimen initiated : 06/19/2022 Notes: Electronic Signature(s) Signed: 06/19/2022 12:26:35 PM By: Levora Dredge Entered By: Levora Dredge on 06/19/2022 12:26:35 -------------------------------------------------------------------------------- Pain Assessment Details Patient Name: Date of Service: [name],  [name] 06/19/2022 9:45 A M Medical Record Number: [id number] Patient Account Number: [id number] Date of Birth/Sex: Treating RN: [birth year] (62 y.o. [name] Primary Care Orvill Coulthard: Lamonte Sakai Other Clinician: Referring Abas Leicht: Treating Mishel Sans/Extender: Judithann Graves NDERSO N, CHELSEY Weeks in Treatment: 0 Active Problems Location of Pain Severity and Description of Pain Patient Has Paino Yes Site Locations Rate the pain. Current Pain Level: 2 Pain Management and Medication Current Pain Management: Lisanti, Lark ([id number]) [phone].pdf Page 6 of 10 Notes pain at wound sites Electronic Signature(s) Signed: 06/19/2022 3:58:45 PM By: Levora Dredge Entered By: Levora Dredge on  06/19/2022 10:34:56 -------------------------------------------------------------------------------- Patient/Caregiver Education Details Patient Name: Date of Service: [name], [name] 3/21/2024andnbsp9:45 A M Medical Record Number: [id number] Patient Account Number: [id number] Date of Birth/Gender: Treating RN: [birth year] (62 y.o. [name] Primary Care Physician: Lamonte Sakai Other Clinician: Referring Physician: Treating Physician/Extender: Louie Boston, CHELSEY Weeks in Treatment: 0 Education Assessment Education Provided To: Patient Education Topics Provided Welcome T [facility]-New Patient Packet: o Handouts: The Wound Healing Pledge form, Welcome T [facility] o Methods: Explain/Verbal Responses: State content correctly Wound/Skin Impairment: Handouts: Caring for Your Ulcer Methods: Explain/Verbal Responses: State content correctly Electronic Signature(s) Signed: 06/19/2022 3:58:45 PM By: Levora Dredge Entered By: Levora Dredge on 06/19/2022 12:26:56 -------------------------------------------------------------------------------- Wound Assessment Details Patient Name: Date of Service: [name], [name] 06/19/2022 9:45 A M Medical Record Number: [id number] Patient Account Number: [id number] Date of Birth/Sex: Treating RN: [birth year] (62 y.o. [name] Primary Care Makhi Muzquiz: Lamonte Sakai Other Clinician: Referring Karem Farha: Treating Di Jasmer/Extender: Louie Boston, CHELSEY Weeks in Treatment: 0 Grell, Chewey ([id number]) [id number] Page 7 of 10 Wound Status Wound Number: 1 Primary Etiology: Venous Leg Ulcer Wound Location: Left, Circumferential Lower Leg Wound Status: Open Wounding Event: Other Lesion Comorbid History: Rheumatoid Arthritis Date Acquired: 03/24/2022 Weeks Of Treatment: 0 Clustered Wound: No Photos Wound Measurements Length: (cm) 8.5 Width: (cm) 14 Depth: (cm) 0.1 Area:  (cm) 93.462 Volume: (cm) 9.346 % Reduction in Area: % Reduction in Volume: Epithelialization: None Tunneling: No Undermining: No Wound Description Classification: Full Thickness Without Exposed Suppor Exudate Amount: Medium Exudate Type: Serosanguineous Exudate Color: red, brown t Structures Foul Odor After Cleansing: No Slough/Fibrino Yes Wound Bed Granulation Amount: Medium (34-66%) Exposed Structure Granulation Quality: Red, Pink Fat Layer (Subcutaneous Tissue) Exposed: Yes Necrotic Amount: Medium (34-66%) Necrotic Quality: Adherent Therapist, music) Signed: 06/19/2022 3:58:45 PM By: Levora Dredge Entered By: Levora Dredge on 06/19/2022 11:04:14 -------------------------------------------------------------------------------- Wound Assessment Details Patient Name: Date of Service: [name], [name] 06/19/2022 9:45 A M Medical Record Number: [id number] Patient Account Number: [id number] Date of Birth/Sex: Treating RN: 06-14-1960 (62 y.o. [name] Primary Care Fed Ceci: Lamonte Sakai Other Clinician: Referring Odilon Cass: Treating Shaheen Mende/Extender: Judithann Graves NDERSO N, CHELSEY Weeks in Treatment: 0 Wound Status Wound Number: 2 Primary Etiology: Venous Leg Ulcer Wound Location: Right, Lateral Ankle Wound Status: Open Wounding Event: Gradually Appeared Comorbid History: Rheumatoid Arthritis Date Acquired: 03/24/2022 Acocella, Dallas ([id number]) [phone].pdf Page 8 of 10 Weeks Of Treatment: 0 Clustered Wound: No Photos Wound Measurements Length: (cm) 4.2 Width: (cm) 1.1 Depth: (cm) 0.2 Area: (cm) 3.629 Volume: (cm) 0.726 % Reduction in Area: % Reduction in Volume: Epithelialization: None Tunneling: No Undermining: No Wound Description Classification: Full Thickness Without Exposed Suppor Exudate Amount: Medium Exudate Type: Serosanguineous Exudate Color: red, brown t Structures Foul Odor After Cleansing:  No Slough/Fibrino Yes Wound Bed Granulation Amount: Small (1-33%) Exposed  Structure Granulation Quality: Red, Pink Fat Layer (Subcutaneous Tissue) Exposed: Yes Necrotic Amount: Large (67-100%) Necrotic Quality: Adherent Slough Treatment Notes Wound #2 (Ankle) Wound Laterality: Right, Lateral Cleanser Byram Ancillary Kit - 15 Day Supply Discharge Instruction: Use supplies as instructed; Kit contains: (15) Saline Bullets; (15) 3x3 Gauze; 15 pr Gloves Soap and Water Discharge Instruction: Gently cleanse wound with antibacterial soap, rinse and pat dry prior to dressing wounds Vashe 5.8 (oz) Discharge Instruction: Use vashe 5.8 (oz) as directed Peri-Wound Care Topical Primary Dressing Silvercel Small 2x2 (in/in) Discharge Instruction: Apply Silvercel Small 2x2 (in/in) as instructed Secondary Dressing ABD Pad 5x9 (in/in) Discharge Instruction: Cover with ABD pad Secured With Medipore T - 19M Medipore H Soft Cloth Surgical T ape ape, 2x2 (in/yd) Kerlix Roll Sterile or Non-Sterile 6-ply 4.5x4 (yd/yd) Discharge Instruction: Apply Kerlix as directed Tubigrip Size E, 3.5x10 (in/yds) Discharge Instruction: Apply 3 Tubigrip E 3-finger-widths below knee to base of toes to secure dressing and/or for swelling. Compression Wrap Compression Stockings Add-Ons Kaiser, Jarmal ([id number]) [phone].pdf Page 9 of 10 Electronic Signature(s) Signed: 06/19/2022 12:01:08 PM By: Levora Dredge Previous Signature: 06/19/2022 11:18:57 AM Version By: Worthy Keeler PA-C Entered By: Levora Dredge on 06/19/2022 12:01:08 -------------------------------------------------------------------------------- Wound Assessment Details Patient Name: Date of Service: [name], [name] 06/19/2022 9:45 A M Medical Record Number: [id number] Patient Account Number: [id number] Date of Birth/Sex: Treating RN: [birth year] (62 y.o. [name] Primary Care Sierra Bissonette: Lamonte Sakai Other  Clinician: Referring Makenlee Mckeag: Treating Horace Wishon/Extender: Judithann Graves NDERSO N, CHELSEY Weeks in Treatment: 0 Wound Status Wound Number: 3 Primary Etiology: Venous Leg Ulcer Wound Location: Right, Medial Ankle Wound Status: Open Wounding Event: Gradually Appeared Comorbid History: Rheumatoid Arthritis Date Acquired: 03/24/2022 Weeks Of Treatment: 0 Clustered Wound: No Photos Wound Measurements Length: (cm) 0.5 Width: (cm) 0.5 Depth: (cm) 0.1 Area: (cm) 0.196 Volume: (cm) 0.02 % Reduction in Area: % Reduction in Volume: Epithelialization: None Tunneling: No Undermining: No Wound Description Classification: Full Thickness Without Exposed Support Exudate Amount: Medium Exudate Type: Serosanguineous Exudate Color: red, brown Structures Foul Odor After Cleansing: No Slough/Fibrino Yes Wound Bed Granulation Amount: None Present (0%) Exposed Structure Necrotic Amount: Large (67-100%) Fat Layer (Subcutaneous Tissue) Exposed: No Necrotic Quality: Adherent Slough Treatment Notes Wound #3 (Ankle) Wound Laterality: Right, Medial Cleanser Byram Ancillary Kit - 15 Day Supply Durr, Shawna ([id number] Page 10 of 10 Discharge Instruction: Use supplies as instructed; Kit contains: (15) Saline Bullets; (15) 3x3 Gauze; 15 pr Gloves Soap and Water Discharge Instruction: Gently cleanse wound with antibacterial soap, rinse and pat dry prior to dressing wounds Vashe 5.8 (oz) Discharge Instruction: Use vashe 5.8 (oz) as directed Peri-Wound Care Topical Primary Dressing Silvercel Small 2x2 (in/in) Discharge Instruction: Apply Silvercel Small 2x2 (in/in) as instructed Secondary Dressing ABD Pad 5x9 (in/in) Discharge Instruction: Cover with ABD pad Secured With Medipore T - 19M Medipore H Soft Cloth Surgical T ape ape, 2x2 (in/yd) Kerlix Roll Sterile or Non-Sterile 6-ply 4.5x4 (yd/yd) Discharge Instruction: Apply Kerlix as directed Tubigrip  Size E, 3.5x10 (in/yds) Discharge Instruction: Apply 3 Tubigrip E 3-finger-widths below knee to base of toes to secure dressing and/or for swelling. Compression Wrap Compression Stockings Add-Ons Electronic Signature(s) Signed: 06/19/2022 12:01:21 PM By: Levora Dredge Previous Signature: 06/19/2022 11:19:15 AM Version By: Worthy Keeler PA-C Entered By: Levora Dredge on 06/19/2022 12:01:21 -------------------------------------------------------------------------------- Vitals Details Patient Name: Date of Service: [name], [name] 06/19/2022 9:45 A M Medical Record Number: [id number] Patient Account Number: [id number] Date of Birth/Sex: Treating RN:  1960/09/25 (62 y.o. [name] Primary Care Neilson Oehlert: Lamonte Sakai Other Clinician: Referring Delia Slatten: Treating Ondine Gemme/Extender: Judithann Graves NDERSO N, CHELSEY Weeks in Treatment: 0 Vital Signs Time Taken: 10:35 Temperature (F): 97.8 Height (in): 70 Pulse (bpm): 80 Source: Stated Respiratory Rate (breaths/min): 18 Weight (lbs): 180 Blood Pressure (mmHg): 135/90 Source: Stated Reference Range: 80 - 120 mg / dl Body Mass Index (BMI): 25.8 Electronic Signature(s) Signed: 06/19/2022 3:58:45 PM By: Levora Dredge Entered By: Levora Dredge on 06/19/2022 10:36:34

## 2022-06-22 LAB — AEROBIC CULTURE W GRAM STAIN (SUPERFICIAL SPECIMEN): Gram Stain: NONE SEEN

## 2022-06-23 ENCOUNTER — Encounter: Payer: Self-pay | Admitting: Podiatry

## 2022-06-23 ENCOUNTER — Ambulatory Visit: Payer: 59 | Admitting: Podiatry

## 2022-06-23 DIAGNOSIS — M05741 Rheumatoid arthritis with rheumatoid factor of right hand without organ or systems involvement: Secondary | ICD-10-CM | POA: Diagnosis not present

## 2022-06-23 DIAGNOSIS — L97312 Non-pressure chronic ulcer of right ankle with fat layer exposed: Secondary | ICD-10-CM | POA: Diagnosis not present

## 2022-06-23 DIAGNOSIS — I83002 Varicose veins of unspecified lower extremity with ulcer of calf: Secondary | ICD-10-CM | POA: Diagnosis not present

## 2022-06-23 DIAGNOSIS — F17208 Nicotine dependence, unspecified, with other nicotine-induced disorders: Secondary | ICD-10-CM | POA: Diagnosis not present

## 2022-06-23 DIAGNOSIS — M05742 Rheumatoid arthritis with rheumatoid factor of left hand without organ or systems involvement: Secondary | ICD-10-CM

## 2022-06-23 DIAGNOSIS — L97202 Non-pressure chronic ulcer of unspecified calf with fat layer exposed: Secondary | ICD-10-CM

## 2022-06-23 DIAGNOSIS — E1169 Type 2 diabetes mellitus with other specified complication: Secondary | ICD-10-CM | POA: Diagnosis not present

## 2022-06-23 DIAGNOSIS — L97822 Non-pressure chronic ulcer of other part of left lower leg with fat layer exposed: Secondary | ICD-10-CM | POA: Diagnosis not present

## 2022-06-23 DIAGNOSIS — I87333 Chronic venous hypertension (idiopathic) with ulcer and inflammation of bilateral lower extremity: Secondary | ICD-10-CM | POA: Diagnosis not present

## 2022-06-23 DIAGNOSIS — E11622 Type 2 diabetes mellitus with other skin ulcer: Secondary | ICD-10-CM | POA: Diagnosis not present

## 2022-06-26 ENCOUNTER — Encounter: Payer: 59 | Admitting: Physician Assistant

## 2022-06-26 ENCOUNTER — Encounter: Payer: Self-pay | Admitting: Podiatry

## 2022-06-26 DIAGNOSIS — F17200 Nicotine dependence, unspecified, uncomplicated: Secondary | ICD-10-CM | POA: Diagnosis not present

## 2022-06-26 DIAGNOSIS — L97312 Non-pressure chronic ulcer of right ankle with fat layer exposed: Secondary | ICD-10-CM | POA: Diagnosis not present

## 2022-06-26 DIAGNOSIS — L97822 Non-pressure chronic ulcer of other part of left lower leg with fat layer exposed: Secondary | ICD-10-CM | POA: Diagnosis not present

## 2022-06-26 DIAGNOSIS — E11622 Type 2 diabetes mellitus with other skin ulcer: Secondary | ICD-10-CM | POA: Diagnosis not present

## 2022-06-26 DIAGNOSIS — I87333 Chronic venous hypertension (idiopathic) with ulcer and inflammation of bilateral lower extremity: Secondary | ICD-10-CM | POA: Diagnosis not present

## 2022-06-26 NOTE — Progress Notes (Addendum)
Szumski, Barnabas Lister ([id number]) [id number] Page 1 of 6 Visit Report for 06/26/2022 Chief Complaint Document Details Patient Name: Date of Service: [name], [name] 06/26/2022 10:30 A M Medical Record Number: [id number] Patient Account Number: [id number] Date of Birth/Sex: Treating RN: [birth year] (62 y.o. [name] Primary Care Provider: Lamonte Sakai Other Clinician: Referring Provider: Treating Provider/Extender: Cipriano Bunker, Neelam Weeks in Treatment: 1 Information Obtained from: Patient Chief Complaint Bilateral LE Ulcers Electronic Signature(s) Signed: 06/26/2022 10:49:32 AM By: Worthy Keeler PA-C Entered By: Worthy Keeler on 06/26/2022 10:49:31 -------------------------------------------------------------------------------- HPI Details Patient Name: Date of Service: [name], [name] 06/26/2022 10:30 A M Medical Record Number: [id number] Patient Account Number: [id number] Date of Birth/Sex: Treating RN: May 04, 1960 (62 y.o. [name] Primary Care Provider: Lamonte Sakai Other Clinician: Referring Provider: Treating Provider/Extender: Cipriano Bunker, Neelam Weeks in Treatment: 1 History of Present Illness HPI Description: 06-19-2022 upon evaluation today patient appears to be doing somewhat poorly in regard to a wound that began around Christmas time on the patient's ankle and lower extremities bilaterally. He tells me that he has been having quite significant pain at this point unfortunately. He did go to the emergency department on March 2. However this has been going on since around Christmas. On March 2 he had x-rays that were negative and I did review that today as well. The patient also has what appears to be chronic venous stasis which is an ongoing issue for him here as well and I think that is part of the issue. Right now it is just hard for him to wear anything compression wise although long-term I think he is going to need something. He  does smoke currently that something that he needs to stop. Patient does have a history of diabetes mellitus type 2 which is stated to be diet controlled. He also again is a current smoker which I think is something that he needs to get rid of completely. 06-26-2022 upon evaluation today patient appears to be doing well currently in regard to his wounds all things considered he tells me the pain is a little bit better he has been on the correct antibiotic for only 2 days so he is seeing some improvement already this is already a good sign. Fortunately there does not appear to be any signs of infection locally nor systemically which is great news. No fevers, chills, nausea, vomiting, or diarrhea. Electronic Signature(s) Signed: 06/26/2022 11:17:12 AM By: Worthy Keeler PA-C Entered By: Worthy Keeler on 06/26/2022 11:17:12 -------------------------------------------------------------------------------- Physical Exam Details Patient Name: Date of Service: [name], [name] 06/26/2022 10:30 A M Medical Record Number: [id number] Patient Account Number: [id number] Date of Birth/Sex: Treating RN: February 05, 1961 (62 y.o. [name] Primary Care Provider: Lamonte Sakai Other Clinician: Referring Provider: Treating Provider/Extender: Cipriano Bunker, Neelam Weeks in Treatment: 1 Constitutional Well-nourished and well-hydrated in no acute distress. Respiratory normal breathing without difficulty. Mclear, Barnabas Lister ([id number]) [id number] Page 2 of 6 Psychiatric this patient is able to make decisions and demonstrates good insight into disease process. Alert and Oriented x 3. pleasant and cooperative. Notes Upon inspection patient's wound bed actually showed signs of good granulation epithelization at this point. Fortunately I do not see any evidence of active infection locally nor systemically which is great news and overall I am extremely pleased with where we stand currently. I  think that he is actually making good progress and the infection does seem to be improving he was positive for both  Pseudomonas and Staphylococcus I switched him to Cipro discontinue the Bactrim he is doing much better. Electronic Signature(s) Signed: 06/26/2022 11:17:48 AM By: Worthy Keeler PA-C Entered By: Worthy Keeler on 06/26/2022 11:17:48 -------------------------------------------------------------------------------- Physician Orders Details Patient Name: Date of Service: [name], [name] 06/26/2022 10:30 A M Medical Record Number: [id number] Patient Account Number: [id number] Date of Birth/Sex: Treating RN: [birth year] (62 y.o. [name] Primary Care Provider: Lamonte Sakai Other Clinician: Referring Provider: Treating Provider/Extender: Cipriano Bunker, Neelam Weeks in Treatment: 1 Verbal / Phone Orders: No Diagnosis Coding ICD-10 Coding Code Description [phone] Chronic venous hypertension (idiopathic) with ulcer and inflammation of bilateral lower extremity L97.312 Non-pressure chronic ulcer of right ankle with fat layer exposed L97.822 Non-pressure chronic ulcer of other part of left lower leg with fat layer exposed F17.208 Nicotine dependence, unspecified, with other nicotine-induced disorders E11.622 Type 2 diabetes mellitus with other skin ulcer Follow-up Appointments Return Appointment in 1 week. Bathing/ Shower/ Hygiene May shower; gently cleanse wound with antibacterial soap, rinse and pat dry prior to dressing wounds No tub bath. Edema Control - Lymphedema / Segmental Compressive Device / Other Bilateral Lower Extremities Tubigrip single layer applied. - size E Elevate, Exercise Daily and A void Standing for Long Periods of Time. Elevate legs to the level of the heart and pump ankles as often as possible Elevate leg(s) parallel to the floor when sitting. DO YOUR BEST to sleep in the bed at night. DO NOT sleep in your recliner. Long hours of sitting in a  recliner leads to swelling of the legs and/or potential wounds on your backside. Medications-Please add to medication list. ntibiotics - continue taking cipro as prescribed P.O. A Wound Treatment Wound #1 - Lower Leg Wound Laterality: Left, Circumferential Cleanser: Byram Ancillary Kit - 15 Day Supply (Generic) 3 x Per Week/30 Days Discharge Instructions: Use supplies as instructed; Kit contains: (15) Saline Bullets; (15) 3x3 Gauze; 15 pr Gloves Cleanser: Soap and Water 3 x Per Week/30 Days Discharge Instructions: Gently cleanse wound with antibacterial soap, rinse and pat dry prior to dressing wounds Cleanser: Vashe 5.8 (oz) 3 x Per Week/30 Days Discharge Instructions: Use vashe 5.8 (oz) as directed Prim Dressing: Silvercel 4 1/4x 4 1/4 (in/in) (Generic) 3 x Per Week/30 Days ary Discharge Instructions: Apply Silvercel 4 1/4x 4 1/4 (in/in) as instructed Secondary Dressing: ABD Pad 5x9 (in/in) (Generic) 3 x Per Week/30 Days Discharge Instructions: Cover with ABD pad Secured With: Medipore T - 58M Medipore H Soft Cloth Surgical T ape ape, 2x2 (in/yd) (Generic) 3 x Per Week/30 Days Secured With: [employer] Sterile or Non-Sterile 6-ply 4.5x4 (yd/yd) (Generic) 3 x Per Week/30 Days Balaban, Tuvia ([id number] Page 3 of 6 Discharge Instructions: Apply Kerlix as directed Secured With: Tubigrip Size E, 3.5x10 (in/yds) 3 x Per Week/30 Days Discharge Instructions: Apply 3 Tubigrip E 3-finger-widths below knee to base of toes to secure dressing and/or for swelling. Wound #2 - Ankle Wound Laterality: Right, Lateral Cleanser: Byram Ancillary Kit - 15 Day Supply (Generic) 3 x Per Week/30 Days Discharge Instructions: Use supplies as instructed; Kit contains: (15) Saline Bullets; (15) 3x3 Gauze; 15 pr Gloves Cleanser: Soap and Water 3 x Per Week/30 Days Discharge Instructions: Gently cleanse wound with antibacterial soap, rinse and pat dry prior to dressing  wounds Cleanser: Vashe 5.8 (oz) 3 x Per Week/30 Days Discharge Instructions: Use vashe 5.8 (oz) as directed Prim Dressing: Silvercel Small 2x2 (in/in) (Generic) 3 x Per Week/30 Days ary Discharge Instructions: Apply  Silvercel Small 2x2 (in/in) as instructed Secondary Dressing: ABD Pad 5x9 (in/in) (Generic) 3 x Per Week/30 Days Discharge Instructions: Cover with ABD pad Secured With: Medipore T - 58M Medipore H Soft Cloth Surgical T ape ape, 2x2 (in/yd) (Generic) 3 x Per Week/30 Days Secured With: [employer] Sterile or Non-Sterile 6-ply 4.5x4 (yd/yd) (Generic) 3 x Per Week/30 Days Discharge Instructions: Apply Kerlix as directed Secured With: Tubigrip Size E, 3.5x10 (in/yds) 3 x Per Week/30 Days Discharge Instructions: Apply 3 Tubigrip E 3-finger-widths below knee to base of toes to secure dressing and/or for swelling. Wound #3 - Ankle Wound Laterality: Right, Medial Cleanser: Byram Ancillary Kit - 15 Day Supply (Generic) 3 x Per Week/30 Days Discharge Instructions: Use supplies as instructed; Kit contains: (15) Saline Bullets; (15) 3x3 Gauze; 15 pr Gloves Cleanser: Soap and Water 3 x Per Week/30 Days Discharge Instructions: Gently cleanse wound with antibacterial soap, rinse and pat dry prior to dressing wounds Cleanser: Vashe 5.8 (oz) 3 x Per Week/30 Days Discharge Instructions: Use vashe 5.8 (oz) as directed Prim Dressing: Silvercel Small 2x2 (in/in) (Generic) 3 x Per Week/30 Days ary Discharge Instructions: Apply Silvercel Small 2x2 (in/in) as instructed Secondary Dressing: ABD Pad 5x9 (in/in) (Generic) 3 x Per Week/30 Days Discharge Instructions: Cover with ABD pad Secured With: Medipore T - 58M Medipore H Soft Cloth Surgical T ape ape, 2x2 (in/yd) (Generic) 3 x Per Week/30 Days Secured With: [employer] Sterile or Non-Sterile 6-ply 4.5x4 (yd/yd) (Generic) 3 x Per Week/30 Days Discharge Instructions: Apply Kerlix as directed Secured With: Tubigrip Size E, 3.5x10 (in/yds) 3 x Per  Week/30 Days Discharge Instructions: Apply 3 Tubigrip E 3-finger-widths below knee to base of toes to secure dressing and/or for swelling. Electronic Signature(s) Signed: 06/26/2022 1:54:18 PM By: Levora Dredge Signed: 06/26/2022 4:02:24 PM By: Worthy Keeler PA-C Entered By: Levora Dredge on 06/26/2022 13:54:18 -------------------------------------------------------------------------------- Problem List Details Patient Name: Date of Service: [name], [name] 06/26/2022 10:30 A M Medical Record Number: [id number] Patient Account Number: [id number] Date of Birth/Sex: Treating RN: 1960-04-07 (62 y.o. [name] Primary Care Provider: Lamonte Sakai Other Clinician: Referring Provider: Treating Provider/Extender: Cipriano Bunker, Neelam Weeks in Treatment: 1 Active Problems Trager, Barnabas Lister ([id number]) [id number] Page 4 of 6 ICD-10 Encounter Code Description Active Date MDM Diagnosis I87.333 Chronic venous hypertension (idiopathic) with ulcer and inflammation of 06/19/2022 No Yes bilateral lower extremity L97.312 Non-pressure chronic ulcer of right ankle with fat layer exposed 06/19/2022 No Yes L97.822 Non-pressure chronic ulcer of other part of left lower leg with fat layer exposed3/21/2024 No Yes F17.208 Nicotine dependence, unspecified, with other nicotine-induced disorders 06/19/2022 No Yes E11.622 Type 2 diabetes mellitus with other skin ulcer 06/19/2022 No Yes Inactive Problems Resolved Problems Electronic Signature(s) Signed: 06/26/2022 10:49:27 AM By: Worthy Keeler PA-C Entered By: Worthy Keeler on 06/26/2022 10:49:26 -------------------------------------------------------------------------------- Progress Note Details Patient Name: Date of Service: [name], [name] 06/26/2022 10:30 A M Medical Record Number: [id number] Patient Account Number: [id number] Date of Birth/Sex: Treating RN: 01/01/1961 (62 y.o. [name] Primary Care Provider: Lamonte Sakai Other Clinician: Referring Provider: Treating Provider/Extender: Cipriano Bunker, Neelam Weeks in Treatment: 1 Subjective Chief Complaint Information obtained from Patient Bilateral LE Ulcers History of Present Illness (HPI) 06-19-2022 upon evaluation today patient appears to be doing somewhat poorly in regard to a wound that began around Christmas time on the patient's ankle and lower extremities bilaterally. He tells me that he has been having quite significant pain at this  point unfortunately. He did go to the emergency department on March 2. However this has been going on since around Christmas. On March 2 he had x-rays that were negative and I did review that today as well. The patient also has what appears to be chronic venous stasis which is an ongoing issue for him here as well and I think that is part of the issue. Right now it is just hard for him to wear anything compression wise although long-term I think he is going to need something. He does smoke currently that something that he needs to stop. Patient does have a history of diabetes mellitus type 2 which is stated to be diet controlled. He also again is a current smoker which I think is something that he needs to get rid of completely. 06-26-2022 upon evaluation today patient appears to be doing well currently in regard to his wounds all things considered he tells me the pain is a little bit better he has been on the correct antibiotic for only 2 days so he is seeing some improvement already this is already a good sign. Fortunately there does not appear to be any signs of infection locally nor systemically which is great news. No fevers, chills, nausea, vomiting, or diarrhea. Objective Constitutional Well-nourished and well-hydrated in no acute distress. Chiao, Barnabas Lister ([id number]) [id number] Page 5 of 6 Vitals Time Taken: 10:51 AM, Height: 70 in, Weight: 180 lbs, BMI: 25.8, Temperature: 97.8 F,  Pulse: 109 bpm, Respiratory Rate: 18 breaths/min, Blood Pressure: 129/72 mmHg. Respiratory normal breathing without difficulty. Psychiatric this patient is able to make decisions and demonstrates good insight into disease process. Alert and Oriented x 3. pleasant and cooperative. General Notes: Upon inspection patient's wound bed actually showed signs of good granulation epithelization at this point. Fortunately I do not see any evidence of active infection locally nor systemically which is great news and overall I am extremely pleased with where we stand currently. I think that he is actually making good progress and the infection does seem to be improving he was positive for both Pseudomonas and Staphylococcus I switched him to Cipro discontinue the Bactrim he is doing much better. Integumentary (Hair, Skin) Wound #1 status is Open. Original cause of wound was Gradually Appeared. The date acquired was: 03/24/2022. The wound has been in treatment 1 weeks. The wound is located on the Left,Circumferential Lower Leg. The wound measures 9cm length x 15.5cm width x 0.1cm depth; 109.563cm^2 area and 10.956cm^3 volume. There is Fat Layer (Subcutaneous Tissue) exposed. There is no tunneling or undermining noted. There is a medium amount of serosanguineous drainage noted. There is small (1-33%) red, pink granulation within the wound bed. There is a large (67-100%) amount of necrotic tissue within the wound bed including Adherent Slough. Wound #2 status is Open. Original cause of wound was Gradually Appeared. The date acquired was: 03/24/2022. The wound has been in treatment 1 weeks. The wound is located on the Right,Lateral Ankle. The wound measures 3.4cm length x 1.2cm width x 0.1cm depth; 3.204cm^2 area and 0.32cm^3 volume. There is Fat Layer (Subcutaneous Tissue) exposed. There is no tunneling or undermining noted. There is a medium amount of serosanguineous drainage noted. There is medium (34-66%)  red, pink granulation within the wound bed. There is a medium (34-66%) amount of necrotic tissue within the wound bed including Adherent Slough. Wound #3 status is Open. Original cause of wound was Gradually Appeared. The date acquired was: 03/24/2022. The wound has been in treatment 1  weeks. The wound is located on the Right,Medial Ankle. The wound measures 0.1cm length x 0.1cm width x 0.1cm depth; 0.008cm^2 area and 0.001cm^3 volume. There is no tunneling or undermining noted. There is a medium amount of serosanguineous drainage noted. There is no granulation within the wound bed. There is a large (67-100%) amount of necrotic tissue within the wound bed including Adherent Slough. 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 I would recommend currently that we have the patient continue to monitor for any signs of worsening from the standpoint of infection he actually seems to be doing quite well and very pleased in that regard right now we are using silver alginate dressings which I think has been doing a good job. 2. Also can recommend that he should continue with the ABD pads to cover followed by roll gauze to secure in place and Tubigrip size E single-layer. 3. And also can recommend that he should continue to elevate his legs much as possible and we also may consider a compression wrap starting next week depends on how well the infection is controlled. 4. He is also going to continue with the Cipro which does seem to already be doing better for him this is great news. We will see patient back for reevaluation in 1 week here in the  clinic. If anything worsens or changes patient will contact our office for additional recommendations. Electronic Signature(s) Signed: 06/26/2022 11:18:52 AM By: Worthy Keeler PA-C Entered By: Worthy Keeler on 06/26/2022 11:18:52 -------------------------------------------------------------------------------- SuperBill Details Patient Name: Date of Service: [name], [name] 06/26/2022 Medical Record Number: [id number] Patient Account Number: [id number] Date of Birth/Sex: Treating RN: 1960/09/20 (62 y.o. [name] Primary Care Provider: Lamonte Sakai Other Clinician: Referring Provider: Treating Provider/Extender: Cipriano Bunker, Neelam Weeks in Treatment: 1 [place], [place] ([id number]) [id number] Page 6 of 6 Diagnosis Coding ICD-10 Codes Code Description [phone] Chronic venous hypertension (idiopathic) with ulcer and inflammation of bilateral lower extremity L97.312 Non-pressure chronic ulcer of right ankle with fat layer exposed L97.822 Non-pressure chronic ulcer of other part of left lower leg with fat layer exposed F17.208 Nicotine dependence, unspecified, with other nicotine-induced disorders E11.622 Type 2 diabetes mellitus with other skin ulcer Facility Procedures : CPT4 Code: [id number] Description: 99214 - WOUND CARE VISIT-LEV 4 EST PT Modifier: Quantity: 1 Physician Procedures : CPT4 Code Description Modifier [id number] - WC PHYS LEVEL 3 - EST PT ICD-10 Diagnosis Description I87.333 Chronic venous hypertension (idiopathic) with ulcer and inflammation of bilateral lower extremity L97.312 Non-pressure chronic ulcer of right  ankle with fat layer exposed L97.822 Non-pressure chronic ulcer of other part of left lower leg with fat layer exposed F17.208 Nicotine dependence, unspecified, with other nicotine-induced disorders Quantity: 1 Electronic Signature(s) Signed: 06/26/2022 1:58:58 PM By: Levora Dredge Signed: 06/26/2022 4:02:24 PM By: Worthy Keeler PA-C Previous Signature: 06/26/2022 11:19:24 AM Version By: Worthy Keeler PA-C Entered By: Levora Dredge on 06/26/2022 13:58:57

## 2022-06-26 NOTE — Progress Notes (Signed)
  Subjective:  Patient ID: [name], male    DOB: 07-30-1960,  MRN: [id number]  Chief Complaint  Patient presents with   Foot Pain    "I'm not sure why I was sent here.  Wound care ordered me some supplies but I haven't received them yet.  I go back to wound care on Thursday."    62 y.o. male presents with the above complaint. History confirmed with patient.  He presents for follow-up, he has been established with the wound care clinic, saw his PCP as well.  Objective:  Physical Exam: warm, good capillary refill, normal DP and PT pulses, normal sensory exam, and venous stasis ulcers wrapped with dressings, some drainage noted  Assessment:   1. Rheumatoid arthritis involving both hands with positive rheumatoid factor ([place])   2. Type 2 diabetes mellitus with other specified complication, unspecified whether long term insulin use ([facility])   3. 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  We discussed his progress.  Cellulitis has resolved, he is undergoing regular wound care at the wound care clinic, cultures were taken last week which show possible Pseudomonas.  His lab work also did have some concern for rheumatoid arthritis, referral has been placed by his PCP to rheumatology.  I discussed with him he will return to see me as needed if his wounds require surgical debridement in the operating room.  Return if symptoms worsen or fail to improve.

## 2022-06-26 NOTE — Progress Notes (Addendum)
[name], [name] ([id number]) [id number] Page 1 of 10 Visit Report for 06/26/2022 Arrival Information Details Patient Name: Date of Service: [name], [name] 06/26/2022 10:30 A M Medical Record Number: [id number] Patient Account Number: [id number] Date of Birth/Sex: Treating RN: [birth year][name] (62 y.o. [name] Primary Care [name]: Lamonte Sakai Other Clinician: Referring Jaelle Campanile: Treating Andrick Rust/Extender: Cipriano Bunker, [name] in Treatment: 1 Visit Information History Since Last Visit Added or deleted any medications: No Patient Arrived: Ambulatory Any new allergies or adverse reactions: No Arrival Time: 10:49 Had a fall or experienced change in No Accompanied By: self activities of daily living that [name] affect Transfer Assistance: None risk of falls: Patient Identification Verified: Yes Hospitalized since last visit: No Secondary Verification Process Completed: Yes Has Dressing in Place as Prescribed: Yes Pain Present Now: No Electronic Signature(s) Signed: 06/26/2022 4:31:55 PM By: Levora Dredge Entered By: Levora Dredge on 06/26/2022 10:51:08 -------------------------------------------------------------------------------- Clinic Level of Care Assessment Details Patient Name: Date of Service: [name], [name] 06/26/2022 10:30 A M Medical Record Number: [id number] Patient Account Number: [id number] Date of Birth/Sex: Treating RN: [name] (62 y.o. [name] Primary Care Rebeka Kimble: Lamonte Sakai Other Clinician: Referring [name]: Treating [name]/Extender: Cipriano Bunker, [name] in Treatment: 1 Clinic Level of Care Assessment Items TOOL 4 Quantity Score []  - 0 Use when only an EandM is performed on FOLLOW-UP visit ASSESSMENTS - Nursing Assessment / Reassessment X- 1 10 Reassessment of Co-morbidities (includes updates in patient status) X- 1 5 Reassessment of Adherence to Treatment Plan ASSESSMENTS - Wound and Skin A  ssessment / Reassessment []  - 0 Simple Wound Assessment / Reassessment - one wound X- 3 5 Complex Wound Assessment / Reassessment - multiple wounds []  - 0 Dermatologic / Skin Assessment (not related to wound area) ASSESSMENTS - Focused Assessment X- 1 5 Circumferential Edema Measurements - multi extremities []  - 0 Nutritional Assessment / Counseling / Intervention []  - 0 Lower Extremity Assessment (monofilament, tuning fork, pulses) []  - 0 Peripheral Arterial Disease Assessment (using hand held doppler) ASSESSMENTS - Ostomy and/or Continence Assessment and Care []  - 0 Incontinence Assessment and Management []  - 0 Ostomy Care Assessment and Management (repouching, etc.) PROCESS - Coordination of Care X - Simple Patient / Family Education for ongoing care 1 15 []  - 0 Complex (extensive) Patient / Family Education for ongoing care Cashman, [name] ([id number]) [id number] Page 2 of 10 X- 1 10 Staff obtains Consents, Records, T Results / Process Orders est []  - 0 Staff telephones HHA, Nursing Homes / Clarify orders / etc []  - 0 Routine Transfer to another Facility (non-emergent condition) []  - 0 Routine Hospital Admission (non-emergent condition) []  - 0 New Admissions / Biomedical engineer / Ordering NPWT Apligraf, etc. , []  - 0 Emergency Hospital Admission (emergent condition) X- 1 10 Simple Discharge Coordination []  - 0 Complex (extensive) Discharge Coordination PROCESS - Special Needs []  - 0 Pediatric / Minor Patient Management []  - 0 Isolation Patient Management []  - 0 Hearing / Language / Visual special needs []  - 0 Assessment of Community assistance (transportation, D/C planning, etc.) []  - 0 Additional assistance / Altered mentation []  - 0 Support Surface(s) Assessment (bed, cushion, seat, etc.) INTERVENTIONS - Wound Cleansing / Measurement []  - 0 Simple Wound Cleansing - one wound X- 3 5 Complex Wound Cleansing - multiple  wounds X- 1 5 Wound Imaging (photographs - any number of wounds) []  - 0 Wound Tracing (instead of photographs) []  - 0 Simple Wound Measurement - one  wound X- 3 5 Complex Wound Measurement - multiple wounds INTERVENTIONS - Wound Dressings X - Small Wound Dressing one or multiple wounds 3 10 []  - 0 Medium Wound Dressing one or multiple wounds []  - 0 Large Wound Dressing one or multiple wounds X- 1 5 Application of Medications - topical []  - 0 Application of Medications - injection INTERVENTIONS - Miscellaneous []  - 0 External ear exam []  - 0 Specimen Collection (cultures, biopsies, blood, body fluids, etc.) []  - 0 Specimen(s) / Culture(s) sent or taken to Lab for analysis []  - 0 Patient Transfer (multiple staff / Civil Service fast streamer / Similar devices) []  - 0 Simple Staple / Suture removal (25 or less) []  - 0 Complex Staple / Suture removal (26 or more) []  - 0 Hypo / Hyperglycemic Management (close monitor of Blood Glucose) []  - 0 Ankle / Brachial Index (ABI) - do not check if billed separately X- 1 5 Vital Signs Has the patient been seen at the hospital within the last three years: Yes Total Score: 145 Level Of Care: New/Established - Level 4 Electronic Signature(s) Signed: 06/26/2022 4:31:55 PM By: Levora Dredge Entered By: Levora Dredge on 06/26/2022 13:58:20 [name], [name] ([id number]) [id number] Page 3 of 10 -------------------------------------------------------------------------------- Encounter Discharge Information Details Patient Name: Date of Service: [name], [name] 06/26/2022 10:30 A M Medical Record Number: [id number] Patient Account Number: [id number] Date of Birth/Sex: Treating RN: [birth year], [name] (62 y.o. [name] Primary Care Sherrika Weakland: Lamonte Sakai Other Clinician: Referring Dajanae Brophy: Treating Keyaan Lederman/Extender: Cipriano Bunker, [name] in Treatment: 1 Encounter Discharge Information Items Discharge Condition:  Stable Ambulatory Status: Ambulatory Discharge Destination: Home Transportation: Private Auto Accompanied By: self Schedule Follow-up Appointment: Yes Clinical Summary of Care: Electronic Signature(s) Signed: 06/26/2022 2:00:21 PM By: Levora Dredge Entered By: Levora Dredge on 06/26/2022 14:00:21 -------------------------------------------------------------------------------- Lower Extremity Assessment Details Patient Name: Date of Service: [name], [name] 06/26/2022 10:30 A M Medical Record Number: [id number] Patient Account Number: [id number] Date of Birth/Sex: Treating RN: [birth year] [name] (62 y.o. [name] Primary Care Jasmine Maceachern: Lamonte Sakai Other Clinician: Referring Marlenne Ridge: Treating Burl Tauzin/Extender: Cipriano Bunker, [name] in Treatment: 1 Edema Assessment Assessed: [Left: No] [Right: No] Edema: [Left: Yes] [Right: Yes] Calf Left: Right: Point of Measurement: 33 cm From Medial Instep 35 cm 38 cm Ankle Left: Right: Point of Measurement: 12 cm From Medial Instep 24 cm 23.3 cm Vascular Assessment Pulses: Dorsalis Pedis Palpable: [Left:Yes] [Right:Yes] Posterior Tibial Palpable: [Left:Yes] [Right:Yes] Electronic Signature(s) Signed: 06/26/2022 4:31:55 PM By: Levora Dredge Entered By: Levora Dredge on 06/26/2022 11:07:57 -------------------------------------------------------------------------------- Multi Wound Chart Details Patient Name: Date of Service: [name], [name] 06/26/2022 10:30 A M Medical Record Number: [id number] Patient Account Number: [id number] Date of Birth/Sex: Treating RN: [birth year] (62 y.o. [name] Primary Care [place] Stell: Lamonte Sakai Other Clinician: Referring Mike Hamre: Treating Janaysia Mcleroy/Extender: Cipriano Bunker, [name] in Treatment: 1 [name], [name] ([id number]) [id number] Page 4 of 10 Vital Signs Height(in): 70 Pulse(bpm): 109 Weight(lbs): 180 Blood Pressure(mmHg): 129/72 Body Mass  Index(BMI): 25.8 Temperature(F): 97.8 Respiratory Rate(breaths/min): 18 [1:Photos:] Left, Circumferential Lower Leg Right, Lateral Ankle Right, Medial Ankle Wound Location: Gradually Appeared Gradually Appeared Gradually Appeared Wounding Event: Venous Leg Ulcer Venous Leg Ulcer Venous Leg Ulcer Primary Etiology: Rheumatoid Arthritis Rheumatoid Arthritis Rheumatoid Arthritis Comorbid History: 03/24/2022 03/24/2022 03/24/2022 Date Acquired: 1 1 1  [name] of Treatment: Open Open Open Wound Status: No No No Wound Recurrence: 9x15.5x0.1 3.4x1.2x0.1 0.1x0.1x0.1 Measurements L x W x D (cm) 109.563 3.204 0.008 A (cm) : rea 10.956  0.32 0.001 Volume (cm) : -17.20% 11.70% 95.90% % Reduction in A rea: -17.20% 55.90% 95.00% % Reduction in Volume: Full Thickness Without Exposed Full Thickness Without Exposed Full Thickness Without Exposed Classification: Support Structures Support Structures Support Structures Medium Medium Medium Exudate A mount: Serosanguineous Serosanguineous Serosanguineous Exudate Type: red, brown red, brown red, brown Exudate Color: Medium (34-66%) Medium (34-66%) None Present (0%) Granulation A mount: Red, Pink Red, Pink N/A Granulation Quality: Medium (34-66%) Medium (34-66%) Large (67-100%) Necrotic A mount: Fat Layer (Subcutaneous Tissue): Yes Fat Layer (Subcutaneous Tissue): Yes Fat Layer (Subcutaneous Tissue): No Exposed Structures: None None None Epithelialization: Treatment Notes Electronic Signature(s) Signed: 06/26/2022 4:31:55 PM By: Levora Dredge Entered By: Levora Dredge on 06/26/2022 11:12:42 -------------------------------------------------------------------------------- Multi-Disciplinary Care Plan Details Patient Name: Date of Service: [name], [name] 06/26/2022 10:30 A M Medical Record Number: [id number] Patient Account Number: [id number] Date of Birth/Sex: Treating RN: [birth year] [name] (62 y.o. [name] [place], [name]  ([id number]) [id number] Page 5 of 10 Primary Care Adalia Pettis: Lamonte Sakai Other Clinician: Referring Yordan Martindale: Treating Rim Thatch/Extender: Cipriano Bunker, [name] in Treatment: 1 Active Inactive Wound/Skin Impairment Nursing Diagnoses: Impaired tissue integrity Knowledge deficit related to ulceration/compromised skin integrity Goals: Ulcer/skin breakdown will have a volume reduction of 30% by week 4 Date Initiated: 06/19/2022 Target Resolution Date: 07/17/2022 Goal Status: Active Ulcer/skin breakdown will have a volume reduction of 50% by week 8 Date Initiated: 06/19/2022 Target Resolution Date: 08/14/2022 Goal Status: Active Ulcer/skin breakdown will have a volume reduction of 80% by week 12 Date Initiated: 06/19/2022 Target Resolution Date: 09/11/2022 Goal Status: Active Ulcer/skin breakdown will heal within 14 [name] Date Initiated: 06/19/2022 Target Resolution Date: 09/25/2022 Goal Status: Active Interventions: Assess patient/caregiver ability to obtain necessary supplies Assess patient/caregiver ability to perform ulcer/skin care regimen upon admission and as needed Assess ulceration(s) every visit Provide education on ulcer and skin care Treatment Activities: Referred to DME Shann Merrick for dressing supplies : 06/19/2022 Skin care regimen initiated : 06/19/2022 Notes: Electronic Signature(s) Signed: 06/26/2022 1:59:13 PM By: Levora Dredge Entered By: Levora Dredge on 06/26/2022 13:59:13 -------------------------------------------------------------------------------- Pain Assessment Details Patient Name: Date of Service: [name], [name] 06/26/2022 10:30 A M Medical Record Number: [id number] Patient Account Number: [id number] Date of Birth/Sex: Treating RN: [birth year], [name] (62 y.o. [name] Primary Care Pattricia Weiher: Lamonte Sakai Other Clinician: Referring Dela Sweeny: Treating Shawntrice Salle/Extender: Cipriano Bunker, [name] in Treatment:  1 Active Problems Location of Pain Severity and Description of Pain Patient Has Paino No Site Locations Rate the pain. Galer, [name] ([id number]) [id number] Page 6 of 10 Rate the pain. Current Pain Level: 0 Pain Management and Medication Current Pain Management: Electronic Signature(s) Signed: 06/26/2022 4:31:55 PM By: Levora Dredge Entered By: Levora Dredge on 06/26/2022 10:52:00 -------------------------------------------------------------------------------- Patient/Caregiver Education Details Patient Name: Date of Service: [name], [name] 3/28/2024andnbsp10:30 A M Medical Record Number: [id number] Patient Account Number: [id number] Date of Birth/Gender: Treating RN: [birth year][name] (62 y.o. [name] Primary Care Physician: Lamonte Sakai Other Clinician: Referring Physician: Treating Physician/Extender: Randell Patient in Treatment: 1 Education Assessment Education Provided To: Patient Education Topics Provided Infection: Handouts: Hygiene and Infection Prevention Methods: Explain/Verbal Responses: State content correctly Wound/Skin Impairment: Handouts: Caring for Your Ulcer Methods: Explain/Verbal Responses: State content correctly Electronic Signature(s) Signed: 06/26/2022 4:31:55 PM By: Levora Dredge Entered By: Levora Dredge on 06/26/2022 13:59:42 -------------------------------------------------------------------------------- Wound Assessment Details Patient Name: Date of Service: [name], [name] 06/26/2022 10:30 A M Medical Record Number: [id number] Patient Account Number: [id number] Date of Birth/Sex: Treating  RN: [name][birth year] (62 y.o. [name] Primary Care Tamu Golz: Lamonte Sakai Other Clinician: Referring Elyanah Farino: Treating Doyle Tegethoff/Extender: Cipriano Bunker, [name] in Treatment: 1 Wound Status [name], [name] ([id number]) [id number] Page 7 of 10 Wound Number: 1 Primary  Etiology: Venous Leg Ulcer Wound Location: Left, Circumferential Lower Leg Wound Status: Open Wounding Event: Gradually Appeared Comorbid History: Rheumatoid Arthritis Date Acquired: 03/24/2022 [name] Of Treatment: 1 Clustered Wound: No Photos Wound Measurements Length: (cm) 9 Width: (cm) 15.5 Depth: (cm) 0.1 Area: (cm) 109.563 Volume: (cm) 10.956 % Reduction in Area: -17.2% % Reduction in Volume: -17.2% Epithelialization: None Tunneling: No Undermining: No Wound Description Classification: Full Thickness Without Exposed Support Structures Exudate Amount: Medium Exudate Type: Serosanguineous Exudate Color: red, brown Foul Odor After Cleansing: No Slough/Fibrino Yes Wound Bed Granulation Amount: Small (1-33%) Exposed Structure Granulation Quality: Red, Pink Fat Layer (Subcutaneous Tissue) Exposed: Yes Necrotic Amount: Large (67-100%) Necrotic Quality: Adherent Slough Treatment Notes Wound #1 (Lower Leg) Wound Laterality: Left, Circumferential Cleanser Byram Ancillary Kit - 15 Day Supply Discharge Instruction: Use supplies as instructed; Kit contains: (15) Saline Bullets; (15) 3x3 Gauze; 15 pr Gloves Soap and Water Discharge Instruction: Gently cleanse wound with antibacterial soap, rinse and pat dry prior to dressing wounds Vashe 5.8 (oz) Discharge Instruction: Use vashe 5.8 (oz) as directed Peri-Wound Care Topical Primary Dressing Silvercel 4 1/4x 4 1/4 (in/in) Discharge Instruction: Apply Silvercel 4 1/4x 4 1/4 (in/in) as instructed Secondary Dressing ABD Pad 5x9 (in/in) Discharge Instruction: Cover with ABD pad Secured With Medipore T - 20M Medipore H Soft Cloth Surgical T ape ape, 2x2 (in/yd) Kerlix Roll Sterile or Non-Sterile 6-ply 4.5x4 (yd/yd) Discharge Instruction: Apply Kerlix as directed Tubigrip Size E, 3.5x10 (in/yds) Discharge Instruction: Apply 3 Tubigrip E 3-finger-widths below knee to base of toes to secure dressing and/or for  swelling. Compression Wrap Grandberry, Hosey ([id number]) [id number] Page 8 of 10 Compression Stockings Add-Ons Electronic Signature(s) Signed: 06/26/2022 4:31:55 PM By: Levora Dredge Entered By: Levora Dredge on 06/26/2022 11:14:35 -------------------------------------------------------------------------------- Wound Assessment Details Patient Name: Date of Service: [name], [name] 06/26/2022 10:30 A M Medical Record Number: [id number] Patient Account Number: [id number] Date of Birth/Sex: Treating RN: [birth year] (62 y.o. [name] Primary Care Tamarcus Condie: Lamonte Sakai Other Clinician: Referring Jibran Crookshanks: Treating Junette Bernat/Extender: Cipriano Bunker, [name] in Treatment: 1 Wound Status Wound Number: 2 Primary Etiology: Venous Leg Ulcer Wound Location: Right, Lateral Ankle Wound Status: Open Wounding Event: Gradually Appeared Comorbid History: Rheumatoid Arthritis Date Acquired: 03/24/2022 [name] Of Treatment: 1 Clustered Wound: No Photos Wound Measurements Length: (cm) 3.4 Width: (cm) 1.2 Depth: (cm) 0.1 Area: (cm) 3.204 Volume: (cm) 0.32 % Reduction in Area: 11.7% % Reduction in Volume: 55.9% Epithelialization: None Tunneling: No Undermining: No Wound Description Classification: Full Thickness Without Exposed Support Structures Exudate Amount: Medium Exudate Type: Serosanguineous Exudate Color: red, brown Foul Odor After Cleansing: No Slough/Fibrino Yes Wound Bed Granulation Amount: Medium (34-66%) Exposed Structure Granulation Quality: Red, Pink Fat Layer (Subcutaneous Tissue) Exposed: Yes Necrotic Amount: Medium (34-66%) Necrotic Quality: Adherent Slough Treatment Notes Wound #2 (Ankle) Wound Laterality: Right, Lateral Cleanser Byram Ancillary Kit - 15 Day Supply Discharge Instruction: Use supplies as instructed; Kit contains: (15) Saline Bullets; (15) 3x3 Gauze; 15 pr Gloves Soap and Water Discharge Instruction: Gently  cleanse wound with antibacterial soap, rinse and pat dry prior to dressing wounds Vashe 5.8 (oz) Discharge Instruction: Use vashe 5.8 (oz) as directed [name], [name] ([id number] Page 9 of 10 Peri-Wound Care Topical Primary Dressing Silvercel Small 2x2 (in/in)  Discharge Instruction: Apply Silvercel Small 2x2 (in/in) as instructed Secondary Dressing ABD Pad 5x9 (in/in) Discharge Instruction: Cover with ABD pad Secured With Medipore T - 56M Medipore H Soft Cloth Surgical T ape ape, 2x2 (in/yd) Kerlix Roll Sterile or Non-Sterile 6-ply 4.5x4 (yd/yd) Discharge Instruction: Apply Kerlix as directed Tubigrip Size E, 3.5x10 (in/yds) Discharge Instruction: Apply 3 Tubigrip E 3-finger-widths below knee to base of toes to secure dressing and/or for swelling. Compression Wrap Compression Stockings Add-Ons Electronic Signature(s) Signed: 06/26/2022 4:31:55 PM By: Levora Dredge Entered By: Levora Dredge on 06/26/2022 11:06:57 -------------------------------------------------------------------------------- Wound Assessment Details Patient Name: Date of Service: [name], [name] 06/26/2022 10:30 A M Medical Record Number: [id number] Patient Account Number: [id number] Date of Birth/Sex: Treating RN: [name][birth year] (62 y.o. [name] Primary Care Bre Pecina: Lamonte Sakai Other Clinician: Referring Ivionna Verley: Treating Sabrinna Yearwood/Extender: Cipriano Bunker, [name] in Treatment: 1 Wound Status Wound Number: 3 Primary Etiology: Venous Leg Ulcer Wound Location: Right, Medial Ankle Wound Status: Open Wounding Event: Gradually Appeared Comorbid History: Rheumatoid Arthritis Date Acquired: 03/24/2022 [name] Of Treatment: 1 Clustered Wound: No Photos Wound Measurements Length: (cm) 0.1 Width: (cm) 0.1 Depth: (cm) 0.1 Area: (cm) 0.008 Volume: (cm) 0.001 % Reduction in Area: 95.9% % Reduction in Volume: 95% Epithelialization: None Tunneling:  No Undermining: No Wound Description Classification: Full Thickness Without Exposed Support Structures Exudate Amount: Medium Exudate Type: Serosanguineous [name], [name] ([id number]) Exudate Color: red, brown Foul Odor After Cleansing: No Slough/Fibrino Yes [id number] Page 10 of 10 Wound Bed Granulation Amount: None Present (0%) Exposed Structure Necrotic Amount: Large (67-100%) Fat Layer (Subcutaneous Tissue) Exposed: No Necrotic Quality: Adherent Slough Treatment Notes Wound #3 (Ankle) Wound Laterality: Right, Medial Cleanser Byram Ancillary Kit - 15 Day Supply Discharge Instruction: Use supplies as instructed; Kit contains: (15) Saline Bullets; (15) 3x3 Gauze; 15 pr Gloves Soap and Water Discharge Instruction: Gently cleanse wound with antibacterial soap, rinse and pat dry prior to dressing wounds Vashe 5.8 (oz) Discharge Instruction: Use vashe 5.8 (oz) as directed Peri-Wound Care Topical Primary Dressing Silvercel Small 2x2 (in/in) Discharge Instruction: Apply Silvercel Small 2x2 (in/in) as instructed Secondary Dressing ABD Pad 5x9 (in/in) Discharge Instruction: Cover with ABD pad Secured With Medipore T - 56M Medipore H Soft Cloth Surgical T ape ape, 2x2 (in/yd) Kerlix Roll Sterile or Non-Sterile 6-ply 4.5x4 (yd/yd) Discharge Instruction: Apply Kerlix as directed Tubigrip Size E, 3.5x10 (in/yds) Discharge Instruction: Apply 3 Tubigrip E 3-finger-widths below knee to base of toes to secure dressing and/or for swelling. Compression Wrap Compression Stockings Add-Ons Electronic Signature(s) Signed: 06/26/2022 4:31:55 PM By: Levora Dredge Entered By: Levora Dredge on 06/26/2022 11:07:29 -------------------------------------------------------------------------------- Vitals Details Patient Name: Date of Service: [name], [name] 06/26/2022 10:30 A M Medical Record Number: [id number] Patient Account Number: [id number] Date of Birth/Sex: Treating  RN: [name][birth year] (62 y.o. [name] Primary Care Tylisa Alcivar: Lamonte Sakai Other Clinician: Referring Natayah Warmack: Treating Carrie Schoonmaker/Extender: Cipriano Bunker, [name] in Treatment: 1 Vital Signs Time Taken: 10:51 Temperature (F): 97.8 Height (in): 70 Pulse (bpm): 109 Weight (lbs): 180 Respiratory Rate (breaths/min): 18 Body Mass Index (BMI): 25.8 Blood Pressure (mmHg): 129/72 Reference Range: 80 - 120 mg / dl Electronic Signature(s) Signed: 06/26/2022 4:31:55 PM By: Levora Dredge Entered By: Levora Dredge on 06/26/2022 10:51:40

## 2022-07-03 ENCOUNTER — Encounter: Payer: 59 | Attending: Physician Assistant | Admitting: Physician Assistant

## 2022-07-03 DIAGNOSIS — L97312 Non-pressure chronic ulcer of right ankle with fat layer exposed: Secondary | ICD-10-CM | POA: Diagnosis not present

## 2022-07-03 DIAGNOSIS — I87333 Chronic venous hypertension (idiopathic) with ulcer and inflammation of bilateral lower extremity: Secondary | ICD-10-CM | POA: Insufficient documentation

## 2022-07-03 DIAGNOSIS — F17218 Nicotine dependence, cigarettes, with other nicotine-induced disorders: Secondary | ICD-10-CM | POA: Diagnosis not present

## 2022-07-03 DIAGNOSIS — E11622 Type 2 diabetes mellitus with other skin ulcer: Secondary | ICD-10-CM | POA: Insufficient documentation

## 2022-07-03 DIAGNOSIS — M069 Rheumatoid arthritis, unspecified: Secondary | ICD-10-CM | POA: Diagnosis not present

## 2022-07-03 DIAGNOSIS — L97822 Non-pressure chronic ulcer of other part of left lower leg with fat layer exposed: Secondary | ICD-10-CM | POA: Insufficient documentation

## 2022-07-03 NOTE — Progress Notes (Addendum)
[name], [name] ([id number]) [id number] Page 1 of 8 Visit Report for 07/03/2022 Chief Complaint Document Details Patient Name: Date of Service: [name], [name] 07/03/2022 10:30 A M Medical Record Number: [id number] Patient Account Number: [id number] Date of Birth/Sex: Treating RN: 02/07/1961 (62 y.o. [name] Primary Care Provider: Yves [name] Other Clinician: Referring Provider: Treating Provider/Extender: [name], [name] in Treatment: 2 Information Obtained from: Patient Chief Complaint Bilateral LE Ulcers Electronic Signature(s) Signed: 07/03/2022 10:53:51 AM By: [name] Kelp [name] Entered By: [name] on 07/03/2022 10:53:50 -------------------------------------------------------------------------------- Debridement Details Patient Name: Date of Service: [name], [name] 07/03/2022 10:30 A M Medical Record Number: [id number] Patient Account Number: [id number] Date of Birth/Sex: Treating RN: 23-May-1960 (62 y.o. [name] Primary Care Provider: Yves [name] Other Clinician: Referring Provider: Treating Provider/Extender: [name], [name] in Treatment: 2 Debridement Performed for Assessment: Wound #3 Right,Medial Ankle Performed By: Physician [name] Debridement Type: Debridement Severity of Tissue Pre Debridement: Limited to breakdown of skin Level of Consciousness (Pre-procedure): Awake and Alert Pre-procedure Verification/Time Out Yes - 11:18 Taken: T Area Debrided (L x W): otal 0.6 (cm) x 0.5 (cm) = 0.3 (cm) Tissue and other material debrided: Viable, Non-Viable, Slough, Subcutaneous, Slough Level: Skin/Subcutaneous Tissue Debridement Description: Excisional Instrument: Curette Bleeding: Minimum Hemostasis Achieved: Pressure Response to Treatment: Procedure was tolerated well Level of Consciousness (Post- Awake and Alert procedure): Post Debridement Measurements of Total Wound Length:  (cm) 0.6 Width: (cm) 0.5 Depth: (cm) 0.2 Volume: (cm) 0.047 Character of Wound/Ulcer Post Debridement: Stable Severity of Tissue Post Debridement: Fat layer exposed Post Procedure Diagnosis Same as Pre-procedure Electronic Signature(s) Signed: 07/03/2022 4:55:39 PM By: [name] Signed: 07/04/2022 8:47:01 AM By: [name] Kelp [name] Entered By: [name] on 07/03/2022 11:20:18 [name], [name] ([id number]) [id number] Page 2 of 8 -------------------------------------------------------------------------------- Debridement Details Patient Name: Date of Service: [name], [name] 07/03/2022 10:30 A M Medical Record Number: [id number] Patient Account Number: [id number] Date of Birth/Sex: Treating RN: [birth year] (62 y.o. [name] Primary Care Provider: Yves [name] Other Clinician: Referring Provider: Treating Provider/Extender: [name], [name] in Treatment: 2 Debridement Performed for Assessment: Wound #2 Right,Lateral Ankle Performed By: Physician [name] Debridement Type: Debridement Severity of Tissue Pre Debridement: Fat layer exposed Level of Consciousness (Pre-procedure): Awake and Alert Pre-procedure Verification/Time Out Yes - 11:20 Taken: Pain Control: Lidocaine 4% T opical Solution T Area Debrided (L x W): otal 3.3 (cm) x 1 (cm) = 3.3 (cm) Tissue and other material debrided: Viable, Non-Viable, Slough, Subcutaneous, Slough Level: Skin/Subcutaneous Tissue Debridement Description: Excisional Instrument: Curette Bleeding: Minimum Hemostasis Achieved: Pressure Response to Treatment: Procedure was tolerated well Level of Consciousness (Post- Awake and Alert procedure): Post Debridement Measurements of Total Wound Length: (cm) 3.3 Width: (cm) 1 Depth: (cm) 0.3 Volume: (cm) 0.778 Character of Wound/Ulcer Post Debridement: Stable Severity of Tissue Post Debridement: Fat layer exposed Post Procedure  Diagnosis Same as Pre-procedure Electronic Signature(s) Signed: 07/03/2022 4:55:39 PM By: [name] Signed: 07/04/2022 8:47:01 AM By: [name] Kelp [name] Entered By: [name] on 07/03/2022 11:22:39 -------------------------------------------------------------------------------- HPI Details Patient Name: Date of Service: [name], [name] 07/03/2022 10:30 A M Medical Record Number: [id number] Patient Account Number: [id number] Date of Birth/Sex: Treating RN: [birth year] (62 y.o. [name] Primary Care Provider: Yves [name] Other Clinician: Referring Provider: Treating Provider/Extender: [name], [name] in Treatment: 2 History of Present Illness HPI Description: 06-19-2022 upon evaluation today patient appears  to be doing somewhat poorly in regard to a wound that began around Christmas time on the patient's ankle and lower extremities bilaterally. He tells me that he has been having quite significant pain at this point unfortunately. He did go to the emergency department on March 2. However this has been going on since around Christmas. On March 2 he had x-rays that were negative and I did review that today as well. The patient also has what appears to be chronic venous stasis which is an ongoing issue for him here as well and I think that is part of the issue. Right now it is just hard for him to wear anything compression wise although long-term I think he is going to need something. He does smoke currently that something that he needs to stop. Patient does have a history of diabetes mellitus type 2 which is stated to be diet controlled. He also again is a current smoker which I think is something that he needs to get rid of completely. 06-26-2022 upon evaluation today patient appears to be doing well currently in regard to his wounds all things considered he tells me the pain is a little bit better he has been on the correct antibiotic for only 2 days so he is  seeing some improvement already this is already a good sign. Fortunately there does not appear to be any signs of infection locally nor systemically which is great news. No fevers, chills, nausea, vomiting, or diarrhea. 07-03-2022 upon evaluation today patient appears to be doing better in regard to his leg the infection is clearing very well and I am actually much more pleased today with where things stand compared to where they were. Fortunately I do not see any signs of active infection locally or systemically at this time which is great news. No fevers, chills, nausea, vomiting, or diarrhea. Cogliano, [name] ([id number]) [id number] Page 3 of 8 Electronic Signature(s) Signed: 07/03/2022 8:09:19 PM By: [name] Kelp [name] Entered By: [name] on 07/03/2022 20:09:19 -------------------------------------------------------------------------------- Physical Exam Details Patient Name: Date of Service: [name], [name] 07/03/2022 10:30 A M Medical Record Number: [id number] Patient Account Number: [id number] Date of Birth/Sex: Treating RN: 1960/05/13 (62 y.o. [name] Primary Care Provider: Yves [name] Other Clinician: Referring Provider: Treating Provider/Extender: [name], [name] in Treatment: 2 Constitutional Well-nourished and well-hydrated in no acute distress. Respiratory normal breathing without difficulty. Psychiatric this patient is able to make decisions and demonstrates good insight into disease process. Alert and Oriented x 3. pleasant and cooperative. Notes Upon inspection patient's wound bed actually showed signs of good granulation and epithelization at this point. Fortunately I do not see any signs of infection I did perform debridement on the right leg however he was having a lot of discomfort he did not want me to do a thing on the left leg. He is going to try to clean some of this off himself at home and we will see how things  look next week. Electronic Signature(s) Signed: 07/03/2022 8:09:39 PM By: [name] Kelp [name] Entered By: [name] on 07/03/2022 20:09:39 -------------------------------------------------------------------------------- Physician Orders Details Patient Name: Date of Service: [name], [name] 07/03/2022 10:30 A M Medical Record Number: [id number] Patient Account Number: [id number] Date of Birth/Sex: Treating RN: 1960/11/14 (62 y.o. [name] Primary Care Provider: Yves [name] Other Clinician: Referring Provider: Treating Provider/Extender: [name], [name] in Treatment: 2 Verbal / Phone Orders: No Diagnosis Coding ICD-10 Coding Code  Description I87.333 Chronic venous hypertension (idiopathic) with ulcer and inflammation of bilateral lower extremity L97.312 Non-pressure chronic ulcer of right ankle with fat layer exposed L97.822 Non-pressure chronic ulcer of other part of left lower leg with fat layer exposed F17.208 Nicotine dependence, unspecified, with other nicotine-induced disorders E11.622 Type 2 diabetes mellitus with other skin ulcer Follow-up Appointments Return Appointment in 1 week. Bathing/ Shower/ Hygiene May shower; gently cleanse wound with antibacterial soap, rinse and pat dry prior to dressing wounds No tub bath. Edema Control - Lymphedema / Segmental Compressive Device / Other Bilateral Lower Extremities Tubigrip single layer applied. - size E Elevate, Exercise Daily and A void Standing for Long Periods of Time. Elevate legs to the level of the heart and pump ankles as often as possible Elevate leg(s) parallel to the floor when sitting. DO YOUR BEST to sleep in the bed at night. DO NOT sleep in your recliner. Long hours of sitting in a recliner leads to swelling of the legs and/or potential wounds on your backside. Medications-Please add to medication list. Clinch, Sheridan ([id number]) [id number] Page 4 of  8 ntibiotics - continue taking cipro as prescribed P.O. A Wound Treatment Wound #1 - Lower Leg Wound Laterality: Left, Circumferential Cleanser: Byram Ancillary Kit - 15 Day Supply (Generic) 3 x Per Week/30 Days Discharge Instructions: Use supplies as instructed; Kit contains: (15) Saline Bullets; (15) 3x3 Gauze; 15 pr Gloves Cleanser: Soap and Water 3 x Per Week/30 Days Discharge Instructions: Gently cleanse wound with antibacterial soap, rinse and pat dry prior to dressing wounds Cleanser: Vashe 5.8 (oz) 3 x Per Week/30 Days Discharge Instructions: Use vashe 5.8 (oz) as directed Prim Dressing: Silvercel 4 1/4x 4 1/4 (in/in) (Generic) 3 x Per Week/30 Days ary Discharge Instructions: Apply Silvercel 4 1/4x 4 1/4 (in/in) as instructed Secondary Dressing: ABD Pad 5x9 (in/in) (Generic) 3 x Per Week/30 Days Discharge Instructions: Cover with ABD pad Secured With: Medipore T - 64M Medipore H Soft Cloth Surgical T ape ape, 2x2 (in/yd) (Generic) 3 x Per Week/30 Days Secured With: [employer] Farm[employer]erlix Roll Sterile or Non-Sterile 6-ply 4.5x4 (yd/yd) (Generic) 3 x Per Week/30 Days Discharge Instructions: Apply Kerlix as directed Secured With: Tubigrip Size E, 3.5x10 (in/yds) 3 x Per Week/30 Days Discharge Instructions: Apply 3 Tubigrip E 3-finger-widths below knee to base of toes to secure dressing and/or for swelling. Wound #2 - Ankle Wound Laterality: Right, Lateral Cleanser: Byram Ancillary Kit - 15 Day Supply (Generic) 3 x Per Week/30 Days Discharge Instructions: Use supplies as instructed; Kit contains: (15) Saline Bullets; (15) 3x3 Gauze; 15 pr Gloves Cleanser: Soap and Water 3 x Per Week/30 Days Discharge Instructions: Gently cleanse wound with antibacterial soap, rinse and pat dry prior to dressing wounds Cleanser: Vashe 5.8 (oz) 3 x Per Week/30 Days Discharge Instructions: Use vashe 5.8 (oz) as directed Prim Dressing: Silvercel Small 2x2 (in/in) (Generic) 3 x Per Week/30 Days ary Discharge  Instructions: Apply Silvercel Small 2x2 (in/in) as instructed Secondary Dressing: ABD Pad 5x9 (in/in) (Generic) 3 x Per Week/30 Days Discharge Instructions: Cover with ABD pad Secured With: Medipore T - 64M Medipore H Soft Cloth Surgical T ape ape, 2x2 (in/yd) (Generic) 3 x Per Week/30 Days Secured With: [employer]erl[employer] Roll Sterile or Non-Sterile 6-ply 4.5x4 (yd/yd) (Generic) 3 x Per Week/30 Days Discharge Instructions: Apply Kerlix as directed Secured With: Tubigrip Size E, 3.5x10 (in/yds) 3 x Per Week/30 Days Discharge Instructions: Apply 3 Tubigrip E 3-finger-widths below knee to base of toes to secure dressing and/or for swelling. Wound #  3 - Ankle Wound Laterality: Right, Medial Cleanser: Byram Ancillary Kit - 15 Day Supply (Generic) 3 x Per Week/30 Days Discharge Instructions: Use supplies as instructed; Kit contains: (15) Saline Bullets; (15) 3x3 Gauze; 15 pr Gloves Cleanser: Soap and Water 3 x Per Week/30 Days Discharge Instructions: Gently cleanse wound with antibacterial soap, rinse and pat dry prior to dressing wounds Cleanser: Vashe 5.8 (oz) 3 x Per Week/30 Days Discharge Instructions: Use vashe 5.8 (oz) as directed Prim Dressing: Silvercel Small 2x2 (in/in) (Generic) 3 x Per Week/30 Days ary Discharge Instructions: Apply Silvercel Small 2x2 (in/in) as instructed Secondary Dressing: ABD Pad 5x9 (in/in) (Generic) 3 x Per Week/30 Days Discharge Instructions: Cover with ABD pad Secured With: Medipore T - 59M Medipore H Soft Cloth Surgical T ape ape, 2x2 (in/yd) (Generic) 3 x Per Week/30 Days Secured With: [employer] Sterile or Non-Sterile 6-ply 4.5x4 (yd/yd) (Generic) 3 x Per Week/30 Days Discharge Instructions: Apply Kerlix as directed Secured With: Tubigrip Size E, 3.5x10 (in/yds) 3 x Per Week/30 Days Discharge Instructions: Apply 3 Tubigrip E 3-finger-widths below knee to base of toes to secure dressing and/or for swelling. [name], [name] ([id number]) [id number]  Page 5 of 8 Electronic Signature(s) Signed: 07/03/2022 4:55:39 PM By: [name] Signed: 07/04/2022 8:47:01 AM By: [name] Kelp [name] Entered By: [name] on 07/03/2022 16:43:16 -------------------------------------------------------------------------------- Problem List Details Patient Name: Date of Service: [name], [name] 07/03/2022 10:30 A M Medical Record Number: [id number] Patient Account Number: [id number] Date of Birth/Sex: Treating RN: [birth year] (62 y.o. [name] Primary Care Provider: Yves [name] Other Clinician: Referring Provider: Treating Provider/Extender: [name], [name] in Treatment: 2 Active Problems ICD-10 Encounter Code Description Active Date MDM Diagnosis I87.333 Chronic venous hypertension (idiopathic) with ulcer and inflammation of 06/19/2022 No Yes bilateral lower extremity L97.312 Non-pressure chronic ulcer of right ankle with fat layer exposed 06/19/2022 No Yes L97.822 Non-pressure chronic ulcer of other part of left lower leg with fat layer exposed3/21/2024 No Yes F17.218 Nicotine dependence, cigarettes, with other nicotine-induced disorders 06/19/2022 No Yes E11.622 Type 2 diabetes mellitus with other skin ulcer 06/19/2022 No Yes Inactive Problems Resolved Problems Electronic Signature(s) Signed: 07/04/2022 1:31:41 PM By: [name] Kelp [name] Previous Signature: 07/03/2022 10:53:44 AM Version By: [name] Kelp [name] Entered By: [name] on 07/04/2022 13:31:41 -------------------------------------------------------------------------------- Progress Note Details Patient Name: Date of Service: [name], [name] 07/03/2022 10:30 A M Medical Record Number: [id number] Patient Account Number: [id number] Date of Birth/Sex: Treating RN: 1960-08-28 (62 y.o. [name] Primary Care Provider: Yves [name] Other Clinician: Referring Provider: Treating Provider/Extender: [name], [name] in Treatment:  2 Subjective Chief Complaint Information obtained from Patient Bilateral LE Ulcers History of Present Illness (HPI) 06-19-2022 upon evaluation today patient appears to be doing somewhat poorly in regard to a wound that began around Christmas time on the patient's ankle [name], [name] ([id number]) [id number] Page 6 of 8 and lower extremities bilaterally. He tells me that he has been having quite significant pain at this point unfortunately. He did go to the emergency department on March 2. However this has been going on since around Christmas. On March 2 he had x-rays that were negative and I did review that today as well. The patient also has what appears to be chronic venous stasis which is an ongoing issue for him here as well and I think that is part of the issue. Right now it is just hard for him to wear anything compression wise  although long-term I think he is going to need something. He does smoke currently that something that he needs to stop. Patient does have a history of diabetes mellitus type 2 which is stated to be diet controlled. He also again is a current smoker which I think is something that he needs to get rid of completely. 06-26-2022 upon evaluation today patient appears to be doing well currently in regard to his wounds all things considered he tells me the pain is a little bit better he has been on the correct antibiotic for only 2 days so he is seeing some improvement already this is already a good sign. Fortunately there does not appear to be any signs of infection locally nor systemically which is great news. No fevers, chills, nausea, vomiting, or diarrhea. 07-03-2022 upon evaluation today patient appears to be doing better in regard to his leg the infection is clearing very well and I am actually much more pleased today with where things stand compared to where they were. Fortunately I do not see any signs of active infection locally or systemically  at this time which is great news. No fevers, chills, nausea, vomiting, or diarrhea. Objective Constitutional Well-nourished and well-hydrated in no acute distress. Vitals Time Taken: 10:30 AM, Height: 70 in, Weight: 180 lbs, BMI: 25.8, Temperature: 97.6 F, Pulse: 105 bpm, Respiratory Rate: 18 breaths/min, Blood Pressure: 123/83 mmHg. Respiratory normal breathing without difficulty. Psychiatric this patient is able to make decisions and demonstrates good insight into disease process. Alert and Oriented x 3. pleasant and cooperative. General Notes: Upon inspection patient's wound bed actually showed signs of good granulation and epithelization at this point. Fortunately I do not see any signs of infection I did perform debridement on the right leg however he was having a lot of discomfort he did not want me to do a thing on the left leg. He is going to try to clean some of this off himself at home and we will see how things look next week. Integumentary (Hair, Skin) Wound #1 status is Open. Original cause of wound was Gradually Appeared. The date acquired was: 03/24/2022. The wound has been in treatment 2 [name]. The wound is located on the Left,Circumferential Lower Leg. The wound measures 6.5cm length x 16.5cm width x 0.1cm depth; 84.234cm^2 area and 8.423cm^3 volume. There is Fat Layer (Subcutaneous Tissue) exposed. There is no tunneling or undermining noted. There is a medium amount of serosanguineous drainage noted. There is small (1-33%) red, pink granulation within the wound bed. There is a large (67-100%) amount of necrotic tissue within the wound bed including Adherent Slough. Wound #2 status is Open. Original cause of wound was Gradually Appeared. The date acquired was: 03/24/2022. The wound has been in treatment 2 [name]. The wound is located on the Right,Lateral Ankle. The wound measures 3.3cm length x 1cm width x 0.1cm depth; 2.592cm^2 area and 0.259cm^3 volume. There is Fat Layer  (Subcutaneous Tissue) exposed. There is no tunneling or undermining noted. There is a medium amount of serosanguineous drainage noted. There is medium (34-66%) red, pink granulation within the wound bed. There is a medium (34-66%) amount of necrotic tissue within the wound bed including Adherent Slough. Wound #3 status is Open. Original cause of wound was Gradually Appeared. The date acquired was: 03/24/2022. The wound has been in treatment 2 [name]. The wound is located on the Right,Medial Ankle. The wound measures 0.6cm length x 0.5cm width x 0.1cm depth; 0.236cm^2 area and 0.024cm^3 volume. There is no  tunneling or undermining noted. There is a medium amount of serosanguineous drainage noted. There is no granulation within the wound bed. There is a large (67-100%) amount of necrotic tissue within the wound bed including Adherent Slough. 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 There was a Excisional Skin/Subcutaneous Tissue Debridement with a total area of 3.3 sq cm performed by [name]. With the following instrument(s): Curette to remove Viable and Non-Viable tissue/material. Material removed includes Subcutaneous Tissue and Slough and after achieving pain control using Lidocaine 4% T opical Solution. No specimens were taken. A time out was conducted at 11:20, prior to the start of the procedure. A Minimum Ilyas, Parmvir ([id number])  [id number] Page 7 of 8 amount of bleeding was controlled with Pressure. The procedure was tolerated well. Post Debridement Measurements: 3.3cm length x 1cm width x 0.3cm depth; 0.778cm^3 volume. Character of Wound/Ulcer Post Debridement is stable. Severity of Tissue Post Debridement is: Fat layer exposed. Post procedure Diagnosis Wound #2: Same as Pre-Procedure Wound #3 Pre-procedure diagnosis of Wound #3 is a Venous Leg Ulcer located on the Right,Medial Ankle .Severity of Tissue Pre Debridement is: Limited to breakdown of skin. There was a Excisional Skin/Subcutaneous Tissue Debridement with a total area of 0.3 sq cm performed by [name]. With the following instrument(s): Curette to remove Viable and Non-Viable tissue/material. Material removed includes Subcutaneous Tissue and Slough and. No specimens were taken. A time out was conducted at 11:18, prior to the start of the procedure. A Minimum amount of bleeding was controlled with Pressure. The procedure was tolerated well. Post Debridement Measurements: 0.6cm length x 0.5cm width x 0.2cm depth; 0.047cm^3 volume. Character of Wound/Ulcer Post Debridement is stable. Severity of Tissue Post Debridement is: Fat layer exposed. Post procedure Diagnosis Wound #3: Same as Pre-Procedure Plan Follow-up Appointments: Return Appointment in 1 week. Bathing/ Shower/ Hygiene: May shower; gently cleanse wound with antibacterial soap, rinse and pat dry prior to dressing wounds No tub bath. Edema Control - Lymphedema / Segmental Compressive Device / Other: Tubigrip single layer applied. - size E Elevate, Exercise Daily and Avoid Standing for Long Periods of Time. Elevate legs to the level of the heart and pump ankles as often as possible Elevate leg(s) parallel to the floor when sitting. DO YOUR BEST to sleep in the bed at night. DO NOT sleep in your recliner. Long hours of sitting in a recliner leads to swelling of  the legs and/or potential wounds on your backside. Medications-Please add to medication list.: P.O. Antibiotics - continue taking cipro as prescribed WOUND #1: - Lower Leg Wound Laterality: Left, Circumferential Cleanser: Byram Ancillary Kit - 15 Day Supply (Generic) 3 x Per Week/30 Days Discharge Instructions: Use supplies as instructed; Kit contains: (15) Saline Bullets; (15) 3x3 Gauze; 15 pr Gloves Cleanser: Soap and Water 3 x Per Week/30 Days Discharge Instructions: Gently cleanse wound with antibacterial soap, rinse and pat dry prior to dressing wounds Cleanser: Vashe 5.8 (oz) 3 x Per Week/30 Days Discharge Instructions: Use vashe 5.8 (oz) as directed Prim Dressing: Silvercel 4  1/4x 4 1/4 (in/in) (Generic) 3 x Per Week/30 Days ary Discharge Instructions: Apply Silvercel 4 1/4x 4 1/4 (in/in) as instructed Secondary Dressing: ABD Pad 5x9 (in/in) (Generic) 3 x Per Week/30 Days Discharge Instructions: Cover with ABD pad Secured With: Medipore T - 94M Medipore H Soft Cloth Surgical T ape ape, 2x2 (in/yd) (Generic) 3 x Per Week/30 Days Secured With: [employer] Sterile or Non-Sterile 6-ply 4.5x4 (yd/yd) (Generic) 3 x Per Week/30 Days Discharge Instructions: Apply Kerlix as directed Secured With: Tubigrip Size E, 3.5x10 (in/yds) 3 x Per Week/30 Days Discharge Instructions: Apply 3 Tubigrip E 3-finger-widths below knee to base of toes to secure dressing and/or for swelling. WOUND #2: - Ankle Wound Laterality: Right, Lateral Cleanser: Byram Ancillary Kit - 15 Day Supply (Generic) 3 x Per Week/30 Days Discharge Instructions: Use supplies as instructed; Kit contains: (15) Saline Bullets; (15) 3x3 Gauze; 15 pr Gloves Cleanser: Soap and Water 3 x Per Week/30 Days Discharge Instructions: Gently cleanse wound with antibacterial soap, rinse and pat dry prior to dressing wounds Cleanser: Vashe 5.8 (oz) 3 x Per Week/30 Days Discharge Instructions: Use vashe 5.8 (oz) as directed Prim Dressing:  Silvercel Small 2x2 (in/in) (Generic) 3 x Per Week/30 Days ary Discharge Instructions: Apply Silvercel Small 2x2 (in/in) as instructed Secondary Dressing: ABD Pad 5x9 (in/in) (Generic) 3 x Per Week/30 Days Discharge Instructions: Cover with ABD pad Secured With: Medipore T - 94M Medipore H Soft Cloth Surgical T ape ape, 2x2 (in/yd) (Generic) 3 x Per Week/30 Days Secured With: [employer] Sterile or Non-Sterile 6-ply 4.5x4 (yd/yd) (Generic) 3 x Per Week/30 Days Discharge Instructions: Apply Kerlix as directed Secured With: Tubigrip Size E, 3.5x10 (in/yds) 3 x Per Week/30 Days Discharge Instructions: Apply 3 Tubigrip E 3-finger-widths below knee to base of toes to secure dressing and/or for swelling. WOUND #3: - Ankle Wound Laterality: Right, Medial Cleanser: Byram Ancillary Kit - 15 Day Supply (Generic) 3 x Per Week/30 Days Discharge Instructions: Use supplies as instructed; Kit contains: (15) Saline Bullets; (15) 3x3 Gauze; 15 pr Gloves Cleanser: Soap and Water 3 x Per Week/30 Days Discharge Instructions: Gently cleanse wound with antibacterial soap, rinse and pat dry prior to dressing wounds Cleanser: Vashe 5.8 (oz) 3 x Per Week/30 Days Discharge Instructions: Use vashe 5.8 (oz) as directed Prim Dressing: Silvercel Small 2x2 (in/in) (Generic) 3 x Per Week/30 Days ary Discharge Instructions: Apply Silvercel Small 2x2 (in/in) as instructed Secondary Dressing: ABD Pad 5x9 (in/in) (Generic) 3 x Per Week/30 Days Discharge Instructions: Cover with ABD pad Secured With: Medipore T - 94M Medipore H Soft Cloth Surgical T ape ape, 2x2 (in/yd) (Generic) 3 x Per Week/30 Days Secured With: [employer] Sterile or Non-Sterile 6-ply 4.5x4 (yd/yd) (Generic) 3 x Per Week/30 Days Discharge Instructions: Apply Kerlix as directed Secured With: Tubigrip Size E, 3.5x10 (in/yds) 3 x Per Week/30 Days Discharge Instructions: Apply 3 Tubigrip E 3-finger-widths below knee to base of toes to secure dressing and/or for  swelling. 1. Would recommend currently that the patient continue to monitor for any signs of worsening from the infectious standpoint I think he is actually doing quite well though right now. Will continue with the Tubigrip size E for the time being. 2. Were also going to continue with the Vashe to clean followed by the silver cell to the wounds which I think is doing a pretty good job here. [name], [name] ([id number]) [id number] Page 8 of 8 3. I would also recommend he continue to elevate his legs he is  going to try to clean a lot of this off at home which I think would definitely be beneficial. 4. I did also have a roughly 5-minute conversation with the patient regarding smoking cessation. In fact I printed off the information for the 1 800 quit now website which includes numbers for him to both call, text, or even visit the website to look at resources to help with smoking cessation. I explained the benefits of stopping and the detriment of continuing especially as young as he is. The patient voiced understanding he did not make any commitment to quit right now but we will continue to monitor and if he wants any help I will be happy to do so. We will see patient back for reevaluation in 1 week here in the clinic. If anything worsens or changes patient will contact our office for additional recommendations. Electronic Signature(s) Signed: 07/04/2022 1:31:51 PM By: [name] KelpStone III, Annalucia Laino [name] Previous Signature: 07/04/2022 1:31:15 PM Version By: [name] KelpStone III, Coumba Kellison [name] Previous Signature: 07/03/2022 8:10:12 PM Version By: [name] KelpStone III, Markesha Hannig [name] Entered By: [name] KelpStone III, Dijon Cosens on 07/04/2022 13:31:51 -------------------------------------------------------------------------------- SuperBill Details Patient Name: Date of Service: [name], [name] 07/03/2022 Medical Record Number: [id number] Patient Account Number: [id number] Date of Birth/Sex: Treating RN: 10/09/1960 (62 y.o. 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 F17.218 Nicotine dependence, cigarettes, with other nicotine-induced disorders E11.622 Type 2 diabetes mellitus with other skin ulcer Facility Procedures : CPT4 Code: [id number] Description: 11042 - DEB SUBQ TISSUE 20 SQ CM/< ICD-10 Diagnosis Description L97.312 Non-pressure chronic ulcer of right ankle with fat layer exposed Modifier: Quantity: 1 : CPT4 Code: [id number] Description: 99406-SMOKING CESSATION 3-10MINS ICD-10 Diagnosis Description F17.218 Nicotine dependence, cigarettes, with other nicotine-induced disorders Modifier: Quantity: 1 Physician Procedures : CPT4 Code Description Modifier [id number] 11042 - WC PHYS SUBQ TISS 20 SQ CM ICD-10 Diagnosis Description L97.312 Non-pressure chronic ulcer of right ankle with fat layer exposed Quantity: 1 : 99406 99406- SMOKING CESSATION 3-10 MINS ICD-10 Diagnosis Description F17.218 Nicotine dependence, cigarettes, with other nicotine-induced disorders Quantity: 1 Electronic Signature(s) Signed: 07/04/2022 1:32:18 PM By: [name] KelpStone III, Monzerat Handler [name] Previous Signature: 07/03/2022 8:11:12 PM Version By: [name] KelpStone III, Natonya Finstad [name] Entered By: [name] KelpStone III, Kirt Chew on 07/04/2022 13:32:18

## 2022-07-04 NOTE — Progress Notes (Signed)
Swords, [name] ([id number]) [id number] Page 1 of 10 Visit Report for 07/03/2022 Arrival Information Details Patient Name: Date of Service: [name], [name] 07/03/2022 10:30 A M Medical Record Number: [id number] Patient Account Number: [id number] Date of Birth/Sex: Treating RN: May 26, 1960 (62 y.o. [name] Primary Care [name]: [name] Other Clinician: Referring [name]: Treating [name]: [name], [name] in Treatment: 2 Visit Information History Since Last Visit Added or deleted any medications: No Patient Arrived: Ambulatory Any new allergies or adverse reactions: No Arrival Time: 10:27 Had a fall or experienced change in No Accompanied By: self activities of daily living that may affect Transfer Assistance: None risk of falls: Patient Identification Verified: Yes Hospitalized since last visit: No Secondary Verification Process Completed: Yes Has Dressing in Place as Prescribed: Yes Pain Present Now: Yes Electronic Signature(s) Signed: 07/03/2022 4:55:39 PM By: [name] Entered By: [name] on 07/03/2022 10:30:17 -------------------------------------------------------------------------------- Clinic Level of Care Assessment Details Patient Name: Date of Service: [name], [name] 07/03/2022 10:30 A M Medical Record Number: [id number] Patient Account Number: [id number] Date of Birth/Sex: Treating RN: October 21, 1960 (62 y.o. [name] Primary Care Lelend Heinecke: [name] Other Clinician: Referring Rally Ouch: Treating [name]: [name], [name] in Treatment: 2 Clinic Level of Care Assessment Items TOOL 1 Quantity Score []  - 0 Use when EandM and Procedure is performed on INITIAL visit ASSESSMENTS - Nursing Assessment / Reassessment []  - 0 General Physical Exam (combine w/ comprehensive assessment (listed just below) when performed on new pt. evals) []  - 0 Comprehensive Assessment (HX,  ROS, Risk Assessments, Wounds Hx, etc.) ASSESSMENTS - Wound and Skin Assessment / Reassessment []  - 0 Dermatologic / Skin Assessment (not related to wound area) ASSESSMENTS - Ostomy and/or Continence Assessment and Care []  - 0 Incontinence Assessment and Management []  - 0 Ostomy Care Assessment and Management (repouching, etc.) PROCESS - Coordination of Care []  - 0 Simple Patient / Family Education for ongoing care []  - 0 Complex (extensive) Patient / Family Education for ongoing care []  - 0 Staff obtains Chiropractor, Records, T Results / Process Orders est []  - 0 Staff telephones HHA, Nursing Homes / Clarify orders / etc []  - 0 Routine Transfer to another Facility (non-emergent condition) []  - 0 Routine Hospital Admission (non-emergent condition) []  - 0 New Admissions / Insurance Authorizations / Ordering NPWT Apligraf, etc. , []  - 0 Emergency Hospital Admission (emergent condition) PROCESS - Special Needs [name] ([id number]) [id number] Page 2 of 10 []  - 0 Pediatric / Minor Patient Management []  - 0 Isolation Patient Management []  - 0 Hearing / Language / Visual special needs []  - 0 Assessment of Community assistance (transportation, D/C planning, etc.) []  - 0 Additional assistance / Altered mentation []  - 0 Support Surface(s) Assessment (bed, cushion, seat, etc.) INTERVENTIONS - Miscellaneous []  - 0 External ear exam []  - 0 Patient Transfer (multiple staff / Nurse, adult / Similar devices) []  - 0 Simple Staple / Suture removal (25 or less) []  - 0 Complex Staple / Suture removal (26 or more) []  - 0 Hypo/Hyperglycemic Management (do not check if billed separately) []  - 0 Ankle / Brachial Index (ABI) - do not check if billed separately Has the patient been seen at the hospital within the last three years: Yes Total Score: 0 Level Of Care: ____ Electronic Signature(s) Signed: 07/03/2022 4:55:39 PM By: [name] Entered By: [name] on 07/03/2022 16:43:22 -------------------------------------------------------------------------------- Encounter Discharge Information Details Patient Name: Date of Service: [name], JA  CK 07/03/2022 10:30 A M Medical Record Number: [id number] Patient Account Number: [id number] Date of Birth/Sex: Treating RN: 11/27/1960 (62 y.o. [name]) [name] Primary Care Tylique Aull: [name] Other Clinician: Referring Krysia Zahradnik: Treating [name]: [name] MooreStone, [name], [name] in Treatment: 2 Encounter Discharge Information Items Post Procedure Vitals Discharge Condition: Stable Temperature (F): 97.6 Ambulatory Status: Ambulatory Pulse (bpm): 105 Discharge Destination: Home Respiratory Rate (breaths/min): 18 Transportation: Private Auto Blood Pressure (mmHg): 123/83 Accompanied By: self Schedule Follow-up Appointment: Yes Clinical Summary of Care: Electronic Signature(s) Signed: 07/03/2022 4:44:49 PM By: [name] PihGordon, [name] Entered By: [name] PihGordon, [name] on 07/03/2022 16:44:49 -------------------------------------------------------------------------------- Lower Extremity Assessment Details Patient Name: Date of Service: [name], [name] 07/03/2022 10:30 A M Medical Record Number: [id number] Patient Account Number: [id number] Date of Birth/Sex: Treating RN: 11/04/1960 (62 y.o. [name]) [name] Primary Care Adithi Gammon: [name] Other Clinician: Referring Ambermarie Honeyman: Treating Gitel Beste/Extender: [name] MooreStone, [name], [name] in Treatment: 2 Edema Assessment Assessed: [Left: No] [Right: No] Edema: [Left: Yes] [Right: Yes] Calf [name], [name] ([id number]) [id number] Page 3 of 10 Left: Right: Point of Measurement: 33 cm From Medial Instep 34 cm 36 cm Ankle Left: Right: Point of Measurement: 12 cm From Medial Instep 23 cm 24 cm Vascular Assessment Pulses: Dorsalis Pedis Palpable: [Left:Yes] [Right:Yes] Posterior Tibial Palpable: [Left:Yes]  [Right:Yes] Electronic Signature(s) Signed: 07/03/2022 4:55:39 PM By: [name] PihGordon, [name] Entered By: [name] PihGordon, [name] on 07/03/2022 10:50:03 -------------------------------------------------------------------------------- Multi Wound Chart Details Patient Name: Date of Service: [name], [name] 07/03/2022 10:30 A M Medical Record Number: [id number] Patient Account Number: [id number] Date of Birth/Sex: Treating RN: 01/19/1961 (62 y.o. [name]) [name] Primary Care Yaniyah Koors: [name] Other Clinician: Referring Sulma Ruffino: Treating Sherita Decoste/Extender: [name] MooreStone, [name], [name] in Treatment: 2 Vital Signs Height(in): 70 Pulse(bpm): 105 Weight(lbs): 180 Blood Pressure(mmHg): 123/83 Body Mass Index(BMI): 25.8 Temperature(F): 97.6 Respiratory Rate(breaths/min): 18 [1:Photos:] Left, Circumferential Lower Leg Right, Lateral Ankle Right, Medial Ankle Wound Location: Gradually Appeared Gradually Appeared Gradually Appeared Wounding Event: Venous Leg Ulcer Venous Leg Ulcer Venous Leg Ulcer Primary Etiology: Rheumatoid Arthritis Rheumatoid Arthritis Rheumatoid Arthritis Comorbid History: 03/24/2022 03/24/2022 03/24/2022 Date Acquired: 2 2 2  [name] of Treatment: Open Open Open Wound Status: No No No Wound Recurrence: 6.5x16.5x0.1 3.3x1x0.1 0.6x0.5x0.1 Measurements L x W x D (cm) 84.234 2.592 0.236 A (cm) : rea 8.423 0.259 0.024 Volume (cm) : 9.90% 28.60% -20.40% % Reduction in A rea: 9.90% 64.30% -20.00% % Reduction in Volume: Full Thickness Without Exposed Full Thickness Without Exposed Full Thickness Without Exposed Classification: Support Structures Support Structures Support Structures Medium Medium Medium Exudate Amount: Spaid, [name] ([id number]) [id number] Page 4 of 10 Serosanguineous Serosanguineous Serosanguineous Exudate Type: red, brown red, brown red, brown Exudate Color: Small (1-33%) Medium (34-66%) None Present (0%) Granulation A  mount: Red, Pink Red, Pink N/A Granulation Quality: Large (67-100%) Medium (34-66%) Large (67-100%) Necrotic A mount: Fat Layer (Subcutaneous Tissue): Yes Fat Layer (Subcutaneous Tissue): Yes Fat Layer (Subcutaneous Tissue): No Exposed Structures: None None None Epithelialization: N/A Debridement - Excisional Debridement - Excisional Debridement: N/A 11:20 11:18 Pre-procedure Verification/Time Out Taken: N/A Lidocaine 4% Topical Solution N/A Pain Control: N/A Subcutaneous, Slough Subcutaneous, Slough Tissue Debrided: N/A Skin/Subcutaneous Tissue Skin/Subcutaneous Tissue Level: N/A 3.3 0.3 Debridement A (sq cm): rea N/A Curette Curette Instrument: N/A Minimum Minimum Bleeding: N/A Pressure Pressure Hemostasis A chieved: N/A Procedure was tolerated well Procedure was tolerated well Debridement Treatment Response: N/A 3.3x1x0.3 0.6x0.5x0.2 Post Debridement Measurements L x W x D (cm) N/A 0.778 0.047 Post Debridement Volume: (cm) N/A Debridement Debridement Procedures  Performed: Treatment Notes Electronic Signature(s) Signed: 07/03/2022 4:42:48 PM By: [name] PihGordon, [name] Previous Signature: 07/03/2022 1:46:38 PM Version By: [name] PihGordon, [name] Entered By: [name] PihGordon, [name] on 07/03/2022 16:42:48 -------------------------------------------------------------------------------- Multi-Disciplinary Care Plan Details Patient Name: Date of Service: [name], [name] 07/03/2022 10:30 A M Medical Record Number: [id number] Patient Account Number: [id number] Date of Birth/Sex: Treating RN: 07/16/1960 (62 y.o. [name]) [name] Primary Care Aleece Loyd: [name] Other Clinician: Referring Trissa Molina: Treating Jabar Krysiak/Extender: [name] MooreStone, [name], [name] in Treatment: 2 Active Inactive Necrotic Tissue Nursing Diagnoses: Impaired tissue integrity related to necrotic/devitalized tissue Goals: Necrotic/devitalized tissue will be minimized in the wound bed Date Initiated: 07/03/2022 Date  Inactivated: 07/03/2022 Target Resolution Date: 07/03/2022 Goal Status: Met Patient/caregiver will verbalize understanding of reason and process for debridement of necrotic tissue Date Initiated: 07/03/2022 Target Resolution Date: 07/11/2022 Goal Status: Active Interventions: Assess patient pain level pre-, during and post procedure and prior to discharge Provide education on necrotic tissue and debridement process Treatment Activities: Apply topical anesthetic as ordered : 07/03/2022 Notes: Wound/Skin Impairment Nursing Diagnoses: Impaired tissue integrity Knowledge deficit related to ulceration/compromised skin integrity Goals: Ulcer/skin breakdown will have a volume reduction of 30% by week 4 Date Initiated: 06/19/2022 Target Resolution Date: 07/17/2022 Alecia LemmingOPE, Eivan ([id number]) [id number] Page 5 of 10 Goal Status: Active Ulcer/skin breakdown will have a volume reduction of 50% by week 8 Date Initiated: 06/19/2022 Target Resolution Date: 08/14/2022 Goal Status: Active Ulcer/skin breakdown will have a volume reduction of 80% by week 12 Date Initiated: 06/19/2022 Target Resolution Date: 09/11/2022 Goal Status: Active Ulcer/skin breakdown will heal within 14 [name] Date Initiated: 06/19/2022 Target Resolution Date: 09/25/2022 Goal Status: Active Interventions: Assess patient/caregiver ability to obtain necessary supplies Assess patient/caregiver ability to perform ulcer/skin care regimen upon admission and as needed Assess ulceration(s) every visit Provide education on ulcer and skin care Treatment Activities: Referred to DME Sonia Stickels for dressing supplies : 06/19/2022 Skin care regimen initiated : 06/19/2022 Notes: Electronic Signature(s) Signed: 07/03/2022 4:43:55 PM By: [name] PihGordon, [name] Previous Signature: 07/03/2022 1:48:24 PM Version By: [name] PihGordon, [name] Entered By: [name] PihGordon, [name] on 07/03/2022  16:43:55 -------------------------------------------------------------------------------- Pain Assessment Details Patient Name: Date of Service: [name], [name] 07/03/2022 10:30 A M Medical Record Number: [id number] Patient Account Number: [id number] Date of Birth/Sex: Treating RN: 01/23/1961 (62 y.o. [name]) [name] Primary Care Quinisha Mould: [name] Other Clinician: Referring Jacalynn Buzzell: Treating Tracye Szuch/Extender: [name] MooreStone, [name], [name] in Treatment: 2 Active Problems Location of Pain Severity and Description of Pain Patient Has Paino Yes Site Locations Rate the pain. Current Pain Level: 3 Pain Management and Medication Current Pain Management: Notes wound site pain Electronic Signature(s) Signed: 07/03/2022 4:55:39 PM By: [name] PihGordon, [name] Entered By: [name] PihGordon, [name] on 07/03/2022 10:33:44 Hennick, [name] ([id number]) [id number] Page 6 of 10 -------------------------------------------------------------------------------- Patient/Caregiver Education Details Patient Name: Date of Service: [name], [name] 4/4/2024andnbsp10:30 A M Medical Record Number: [id number] Patient Account Number: [id number] Date of Birth/Gender: Treating RN: 02/18/1961 (62 y.o. [name]) [name] Primary Care Physician: [name] Other Clinician: Referring Physician: Treating Physician/Extender: Leslee HomeStone, [name], [name] in Treatment: 2 Education Assessment Education Provided To: Patient Education Topics Provided Smoking and Wound Healing: Handouts: Smoking and Wound Healing Methods: Explain/Verbal, Printed Responses: State content correctly Wound Debridement: Handouts: Wound Debridement Methods: Explain/Verbal Responses: State content correctly Wound/Skin Impairment: Handouts: Caring for Your Ulcer Methods: Explain/Verbal Responses: State content correctly Electronic Signature(s) Signed: 07/03/2022 4:55:39 PM By: [name] PihGordon, [name] Entered By: [name] PihGordon, [name] on  07/03/2022 16:44:01 -------------------------------------------------------------------------------- Wound Assessment Details Patient Name: Date of Service:  [name], [name] 07/03/2022 10:30 A M Medical Record Number: [id number] Patient Account Number: [id number] Date of Birth/Sex: Treating RN: 1960/07/11 (62 y.o. [name] Primary Care Lillyana Majette: [name] Other Clinician: Referring Delainey Winstanley: Treating Iszabella Hebenstreit/Extender: [name], [name] in Treatment: 2 Wound Status Wound Number: 1 Primary Etiology: Venous Leg Ulcer Wound Location: Left, Circumferential Lower Leg Wound Status: Open Wounding Event: Gradually Appeared Comorbid History: Rheumatoid Arthritis Date Acquired: 03/24/2022 [name] Of Treatment: 2 Clustered Wound: No Photos [name] ([id number]) [id number] Page 7 of 10 Wound Measurements Length: (cm) 6.5 Width: (cm) 16.5 Depth: (cm) 0.1 Area: (cm) 84.234 Volume: (cm) 8.423 % Reduction in Area: 9.9% % Reduction in Volume: 9.9% Epithelialization: None Tunneling: No Undermining: No Wound Description Classification: Full Thickness Without Exposed Suppor Exudate Amount: Medium Exudate Type: Serosanguineous Exudate Color: red, brown t Structures Foul Odor After Cleansing: No Slough/Fibrino Yes Wound Bed Granulation Amount: Small (1-33%) Exposed Structure Granulation Quality: Red, Pink Fat Layer (Subcutaneous Tissue) Exposed: Yes Necrotic Amount: Large (67-100%) Necrotic Quality: Adherent Slough Treatment Notes Wound #1 (Lower Leg) Wound Laterality: Left, Circumferential Cleanser Byram Ancillary Kit - 15 Day Supply Discharge Instruction: Use supplies as instructed; Kit contains: (15) Saline Bullets; (15) 3x3 Gauze; 15 pr Gloves Soap and Water Discharge Instruction: Gently cleanse wound with antibacterial soap, rinse and pat dry prior to dressing wounds Vashe 5.8 (oz) Discharge Instruction: Use vashe 5.8 (oz) as  directed Peri-Wound Care Topical Primary Dressing Silvercel 4 1/4x 4 1/4 (in/in) Discharge Instruction: Apply Silvercel 4 1/4x 4 1/4 (in/in) as instructed Secondary Dressing ABD Pad 5x9 (in/in) Discharge Instruction: Cover with ABD pad Secured With Medipore T - 31M Medipore H Soft Cloth Surgical T ape ape, 2x2 (in/yd) Kerlix Roll Sterile or Non-Sterile 6-ply 4.5x4 (yd/yd) Discharge Instruction: Apply Kerlix as directed Tubigrip Size E, 3.5x10 (in/yds) Discharge Instruction: Apply 3 Tubigrip E 3-finger-widths below knee to base of toes to secure dressing and/or for swelling. Compression Wrap Compression Stockings Add-Ons Electronic Signature(s) Signed: 07/03/2022 4:55:39 PM By: [name] Entered By: [name] on 07/03/2022 10:47:53 -------------------------------------------------------------------------------- Wound Assessment Details Patient Name: Date of Service: [name], [name] 07/03/2022 10:30 A M Medical Record Number: [id number] Patient Account Number: [id number] Date of Birth/Sex: Treating RN: 1961-01-06 (62 y.o. [name] Primary Care Gordana Kewley: [name] Other Clinician: Referring Wilmoth Rasnic: Treating Lind Ausley/Extender: [name], [name] in Treatment: 2 Wound Status [name] ([id number]) [id number] Page 8 of 10 Wound Number: 2 Primary Etiology: Venous Leg Ulcer Wound Location: Right, Lateral Ankle Wound Status: Open Wounding Event: Gradually Appeared Comorbid History: Rheumatoid Arthritis Date Acquired: 03/24/2022 [name] Of Treatment: 2 Clustered Wound: No Photos Wound Measurements Length: (cm) 3.3 Width: (cm) 1 Depth: (cm) 0.1 Area: (cm) 2.592 Volume: (cm) 0.259 % Reduction in Area: 28.6% % Reduction in Volume: 64.3% Epithelialization: None Tunneling: No Undermining: No Wound Description Classification: Full Thickness Without Exposed Support Structures Exudate Amount: Medium Exudate Type:  Serosanguineous Exudate Color: red, brown Foul Odor After Cleansing: No Slough/Fibrino Yes Wound Bed Granulation Amount: Medium (34-66%) Exposed Structure Granulation Quality: Red, Pink Fat Layer (Subcutaneous Tissue) Exposed: Yes Necrotic Amount: Medium (34-66%) Necrotic Quality: Adherent Slough Treatment Notes Wound #2 (Ankle) Wound Laterality: Right, Lateral Cleanser Byram Ancillary Kit - 15 Day Supply Discharge Instruction: Use supplies as instructed; Kit contains: (15) Saline Bullets; (15) 3x3 Gauze; 15 pr Gloves Soap and Water Discharge Instruction: Gently cleanse wound with antibacterial soap, rinse and pat dry prior to dressing wounds Vashe 5.8 (oz) Discharge Instruction: Use vashe 5.8 (  oz) as directed Peri-Wound Care Topical Primary Dressing Silvercel Small 2x2 (in/in) Discharge Instruction: Apply Silvercel Small 2x2 (in/in) as instructed Secondary Dressing ABD Pad 5x9 (in/in) Discharge Instruction: Cover with ABD pad Secured With Medipore T - 498M Medipore H Soft Cloth Surgical T ape ape, 2x2 (in/yd) Kerlix Roll Sterile or Non-Sterile 6-ply 4.5x4 (yd/yd) Discharge Instruction: Apply Kerlix as directed Tubigrip Size E, 3.5x10 (in/yds) Discharge Instruction: Apply 3 Tubigrip E 3-finger-widths below knee to base of toes to secure dressing and/or for swelling. Compression Wrap [name] ([id number]) [id number] Page 9 of 10 Compression Stockings Add-Ons Electronic Signature(s) Signed: 07/03/2022 4:55:39 PM By: [name] Entered By: [name] on 07/03/2022 10:48:26 -------------------------------------------------------------------------------- Wound Assessment Details Patient Name: Date of Service: [name], [name] 07/03/2022 10:30 A M Medical Record Number: [id number] Patient Account Number: [id number] Date of Birth/Sex: Treating RN: 05/08/1960 (62 y.o. [name] Primary Care Ritisha Deitrick: [name] Other Clinician: Referring  Cydnee Fuquay: Treating Shashank Kwasnik/Extender: [name], [name] in Treatment: 2 Wound Status Wound Number: 3 Primary Etiology: Venous Leg Ulcer Wound Location: Right, Medial Ankle Wound Status: Open Wounding Event: Gradually Appeared Comorbid History: Rheumatoid Arthritis Date Acquired: 03/24/2022 [name] Of Treatment: 2 Clustered Wound: No Photos Wound Measurements Length: (cm) 0.6 Width: (cm) 0.5 Depth: (cm) 0.1 Area: (cm) 0.236 Volume: (cm) 0.024 % Reduction in Area: -20.4% % Reduction in Volume: -20% Epithelialization: None Tunneling: No Undermining: No Wound Description Classification: Full Thickness Without Exposed Support Structures Exudate Amount: Medium Exudate Type: Serosanguineous Exudate Color: red, brown Foul Odor After Cleansing: No Slough/Fibrino Yes Wound Bed Granulation Amount: None Present (0%) Exposed Structure Necrotic Amount: Large (67-100%) Fat Layer (Subcutaneous Tissue) Exposed: No Necrotic Quality: Adherent Slough Treatment Notes Wound #3 (Ankle) Wound Laterality: Right, Medial Cleanser Byram Ancillary Kit - 15 Day Supply Discharge Instruction: Use supplies as instructed; Kit contains: (15) Saline Bullets; (15) 3x3 Gauze; 15 pr Gloves Soap and Water Discharge Instruction: Gently cleanse wound with antibacterial soap, rinse and pat dry prior to dressing wounds Vashe 5.8 (oz) Discharge Instruction: Use vashe 5.8 (oz) as directed [name] ([id number]) [id number] Page 10 of 10 Peri-Wound Care Topical Primary Dressing Silvercel Small 2x2 (in/in) Discharge Instruction: Apply Silvercel Small 2x2 (in/in) as instructed Secondary Dressing ABD Pad 5x9 (in/in) Discharge Instruction: Cover with ABD pad Secured With Medipore T - 498M Medipore H Soft Cloth Surgical T ape ape, 2x2 (in/yd) Kerlix Roll Sterile or Non-Sterile 6-ply 4.5x4 (yd/yd) Discharge Instruction: Apply Kerlix as directed Tubigrip Size E, 3.5x10  (in/yds) Discharge Instruction: Apply 3 Tubigrip E 3-finger-widths below knee to base of toes to secure dressing and/or for swelling. Compression Wrap Compression Stockings Add-Ons Electronic Signature(s) Signed: 07/03/2022 4:55:39 PM By: [name] Entered By: [name] on 07/03/2022 10:48:57 -------------------------------------------------------------------------------- Vitals Details Patient Name: Date of Service: [name], [name] 07/03/2022 10:30 A M Medical Record Number: [id number] Patient Account Number: [id number] Date of Birth/Sex: Treating RN: 1961-01-29 (62 y.o. [name] Primary Care Paraskevi Funez: [name] Other Clinician: Referring Romeo Zielinski: Treating Johnathon Mittal/Extender: [name], [name] in Treatment: 2 Vital Signs Time Taken: 10:30 Temperature (F): 97.6 Height (in): 70 Pulse (bpm): 105 Weight (lbs): 180 Respiratory Rate (breaths/min): 18 Body Mass Index (BMI): 25.8 Blood Pressure (mmHg): 123/83 Reference Range: 80 - 120 mg / dl Electronic Signature(s) Signed: 07/03/2022 4:55:39 PM By: [name] Entered By: [name] on 07/03/2022 10:33:18

## 2022-07-10 ENCOUNTER — Encounter: Payer: 59 | Admitting: Physician Assistant

## 2022-07-10 DIAGNOSIS — E11622 Type 2 diabetes mellitus with other skin ulcer: Secondary | ICD-10-CM | POA: Diagnosis not present

## 2022-07-10 NOTE — Progress Notes (Addendum)
[name], [name] ([id number]) [id number] Page 1 of 7 Visit Report for 07/10/2022 Chief Complaint Document Details Patient Name: Date of Service: [name], [name] 07/10/2022 10:30 A M Medical Record Number: [id number] Patient Account Number: [id number] Date of Birth/Sex: Treating RN: [birth year] (62 y.o. [name] Primary Care Provider: Yves Dill Other Clinician: Referring Provider: Treating Provider/Extender: Luan Moore, Neelam Weeks in Treatment: 3 Information Obtained from: Patient Chief Complaint Bilateral LE Ulcers Electronic Signature(s) Signed: 07/10/2022 10:53:01 AM By: Allen Derry PA-C Entered By: Allen Derry on 07/10/2022 10:53:01 -------------------------------------------------------------------------------- HPI Details Patient Name: Date of Service: [name], [name] 07/10/2022 10:30 A M Medical Record Number: [id number] Patient Account Number: [id number] Date of Birth/Sex: Treating RN: 24-Jun-1960 (62 y.o. [name] Primary Care Provider: Yves Dill Other Clinician: Referring Provider: Treating Provider/Extender: Luan Moore, Neelam Weeks in Treatment: 3 History of Present Illness HPI Description: 06-19-2022 upon evaluation today patient appears to be doing somewhat poorly in regard to a wound that began around Christmas time on the patient's ankle and lower extremities bilaterally. He tells me that he has been having quite significant pain at this point unfortunately. He did go to the emergency department on March 2. However this has been going on since around Christmas. On March 2 he had x-rays that were negative and I did review that today as well. The patient also has what appears to be chronic venous stasis which is an ongoing issue for him here as well and I think that is part of the issue. Right now it is just hard for him to wear anything compression wise although long-term I think he is going to need something. He does  smoke currently that something that he needs to stop. Patient does have a history of diabetes mellitus type 2 which is stated to be diet controlled. He also again is a current smoker which I think is something that he needs to get rid of completely. 06-26-2022 upon evaluation today patient appears to be doing well currently in regard to his wounds all things considered he tells me the pain is a little bit better he has been on the correct antibiotic for only 2 days so he is seeing some improvement already this is already a good sign. Fortunately there does not appear to be any signs of infection locally nor systemically which is great news. No fevers, chills, nausea, vomiting, or diarrhea. 07-03-2022 upon evaluation today patient appears to be doing better in regard to his leg the infection is clearing very well and I am actually much more pleased today with where things stand compared to where they were. Fortunately I do not see any signs of active infection locally or systemically at this time which is great news. No fevers, chills, nausea, vomiting, or diarrhea. 07-10-2022 upon evaluation today patient appears to be doing better I do not see any signs of infection I think he is much better in that regard. Fortunately I do not see any evidence of active infection locally or systemically which is great news. Electronic Signature(s) Signed: 07/10/2022 5:00:31 PM By: Allen Derry PA-C Entered By: Allen Derry on 07/10/2022 17:00:31 -------------------------------------------------------------------------------- Physical Exam Details Patient Name: Date of Service: [name], [name] 07/10/2022 10:30 A M Medical Record Number: [id number] Patient Account Number: [id number] Date of Birth/Sex: Treating RN: February 26, 1961 (62 y.o. [name] Primary Care Provider: Yves Dill Other Clinician: Referring Provider: Treating Provider/Extender: Luan Moore, Neelam Weeks in Treatment: 3 Birkel, [name]  ([id number]) [id number] Page  2 of 7 Constitutional Well-nourished and well-hydrated in no acute distress. Respiratory normal breathing without difficulty. Psychiatric this patient is able to make decisions and demonstrates good insight into disease process. Alert and Oriented x 3. pleasant and cooperative. Notes Upon inspection patient's wound bed showed signs of excellent granulation epithelization at this point. I do think that we are moving in the right direction and I think that he is really doing quite well with regard to his wounds he still has some slough and biofilm buildup he is also still very swollen we will get a try to wrap him today and see how this does. Will see him back for nurse visit on Monday. Electronic Signature(s) Signed: 07/10/2022 5:01:04 PM By: Allen Derry PA-C Entered By: Allen Derry on 07/10/2022 17:01:04 -------------------------------------------------------------------------------- Physician Orders Details Patient Name: Date of Service: [name], [name] 07/10/2022 10:30 A M Medical Record Number: [id number] Patient Account Number: [id number] Date of Birth/Sex: Treating RN: 06-25-1960 (62 y.o. [name] Primary Care Provider: Yves Dill Other Clinician: Referring Provider: Treating Provider/Extender: Luan Moore, Neelam Weeks in Treatment: 3 Verbal / Phone Orders: No Diagnosis Coding ICD-10 Coding Code Description [phone] Chronic venous hypertension (idiopathic) with ulcer and inflammation of bilateral lower extremity L97.312 Non-pressure chronic ulcer of right ankle with fat layer exposed L97.822 Non-pressure chronic ulcer of other part of left lower leg with fat layer exposed F17.218 Nicotine dependence, cigarettes, with other nicotine-induced disorders E11.622 Type 2 diabetes mellitus with other skin ulcer Follow-up Appointments Return Appointment in 1 week. Bathing/ Shower/ Hygiene May shower; gently cleanse  wound with antibacterial soap, rinse and pat dry prior to dressing wounds No tub bath. Edema Control - Lymphedema / Segmental Compressive Device / Other Bilateral Lower Extremities 3 Layer Compression System for Lymphedema. - Urgo 2 layer wrap used size small Elevate, Exercise Daily and A void Standing for Long Periods of Time. Elevate legs to the level of the heart and pump ankles as often as possible Elevate leg(s) parallel to the floor when sitting. DO YOUR BEST to sleep in the bed at night. DO NOT sleep in your recliner. Long hours of sitting in a recliner leads to swelling of the legs and/or potential wounds on your backside. Medications-Please add to medication list. ntibiotics - continue taking cipro as prescribed P.O. A Wound Treatment Wound #1 - Lower Leg Wound Laterality: Left, Circumferential Cleanser: Byram Ancillary Kit - 15 Day Supply (Generic) 1 x Per Week/30 Days Discharge Instructions: Use supplies as instructed; Kit contains: (15) Saline Bullets; (15) 3x3 Gauze; 15 pr Gloves Cleanser: Soap and Water 1 x Per Week/30 Days Discharge Instructions: Gently cleanse wound with antibacterial soap, rinse and pat dry prior to dressing wounds Cleanser: Vashe 5.8 (oz) 1 x Per Week/30 Days Discharge Instructions: Use vashe 5.8 (oz) as directed Topical: Triamcinolone Acetonide Cream, 0.1%, 15 (g) tube 1 x Per Week/30 Days Discharge Instructions: Apply as directed by provider. TO LEG Prim Dressing: Silvercel 4 1/4x 4 1/4 (in/in) (Generic) 1 x Per Week/30 Days ary Hertzog, Mohd ([id number]) [id number] Page 3 of 7 Discharge Instructions: Apply Silvercel 4 1/4x 4 1/4 (in/in) as instructed Secondary Dressing: Zetuvit Plus 4x4 (in/in) 1 x Per Week/30 Days Compression Wrap: Urgo K2 Lite, two layer compression system, regular 1 x Per Week/30 Days Wound #2 - Ankle Wound Laterality: Right, Lateral Cleanser: Byram Ancillary Kit - 15 Day Supply (Generic) 1 x Per Week/30  Days Discharge Instructions: Use supplies as instructed; Kit contains: (15) Saline Bullets; (15) 3x3 Gauze;  15 pr Gloves Cleanser: Soap and Water 1 x Per Week/30 Days Discharge Instructions: Gently cleanse wound with antibacterial soap, rinse and pat dry prior to dressing wounds Cleanser: Vashe 5.8 (oz) 1 x Per Week/30 Days Discharge Instructions: Use vashe 5.8 (oz) as directed Topical: Triamcinolone Acetonide Cream, 0.1%, 15 (g) tube 1 x Per Week/30 Days Discharge Instructions: Apply as directed by provider. TO LEG Prim Dressing: Silvercel 4 1/4x 4 1/4 (in/in) (Generic) 1 x Per Week/30 Days ary Discharge Instructions: Apply Silvercel 4 1/4x 4 1/4 (in/in) as instructed Secondary Dressing: Zetuvit Plus 4x4 (in/in) 1 x Per Week/30 Days Compression Wrap: Urgo K2 Lite, two layer compression system, regular 1 x Per Week/30 Days Wound #3 - Ankle Wound Laterality: Right, Medial Cleanser: Byram Ancillary Kit - 15 Day Supply (Generic) 1 x Per Week/30 Days Discharge Instructions: Use supplies as instructed; Kit contains: (15) Saline Bullets; (15) 3x3 Gauze; 15 pr Gloves Cleanser: Soap and Water 1 x Per Week/30 Days Discharge Instructions: Gently cleanse wound with antibacterial soap, rinse and pat dry prior to dressing wounds Cleanser: Vashe 5.8 (oz) 1 x Per Week/30 Days Discharge Instructions: Use vashe 5.8 (oz) as directed Topical: Triamcinolone Acetonide Cream, 0.1%, 15 (g) tube 1 x Per Week/30 Days Discharge Instructions: Apply as directed by provider. TO LEG Prim Dressing: Silvercel 4 1/4x 4 1/4 (in/in) (Generic) 1 x Per Week/30 Days ary Discharge Instructions: Apply Silvercel 4 1/4x 4 1/4 (in/in) as instructed Secondary Dressing: Zetuvit Plus 4x4 (in/in) 1 x Per Week/30 Days Compression Wrap: Urgo K2 Lite, two layer compression system, regular 1 x Per Week/30 Days Electronic Signature(s) Signed: 07/10/2022 5:07:08 PM By: Angelina Pih Signed: 07/10/2022 10:26:23 PM By: Allen Derry  PA-C Previous Signature: 07/10/2022 12:29:12 PM Version By: Angelina Pih Entered By: Angelina Pih on 07/10/2022 15:58:56 -------------------------------------------------------------------------------- Problem List Details Patient Name: Date of Service: [name], [name] 07/10/2022 10:30 A M Medical Record Number: [id number] Patient Account Number: [id number] Date of Birth/Sex: Treating RN: July 27, 1960 (62 y.o. [name] Primary Care Provider: Yves Dill Other Clinician: Referring Provider: Treating Provider/Extender: Luan Moore, Neelam Weeks in Treatment: 3 Active Problems ICD-10 Encounter Code Description Active Date MDM Diagnosis I87.333 Chronic venous hypertension (idiopathic) with ulcer and inflammation of 06/19/2022 No Yes bilateral lower extremity Rehm, Wissam ([id number]) [id number] Page 4 of 7 L97.312 Non-pressure chronic ulcer of right ankle with fat layer exposed 06/19/2022 No Yes L97.822 Non-pressure chronic ulcer of other part of left lower leg with fat layer exposed3/21/2024 No Yes F17.218 Nicotine dependence, cigarettes, with other nicotine-induced disorders 06/19/2022 No Yes E11.622 Type 2 diabetes mellitus with other skin ulcer 06/19/2022 No Yes Inactive Problems Resolved Problems Electronic Signature(s) Signed: 07/10/2022 10:52:59 AM By: Allen Derry PA-C Entered By: Allen Derry on 07/10/2022 10:52:58 -------------------------------------------------------------------------------- Progress Note Details Patient Name: Date of Service: [name], [name] 07/10/2022 10:30 A M Medical Record Number: [id number] Patient Account Number: [id number] Date of Birth/Sex: Treating RN: Apr 04, 1960 (62 y.o. [name] Primary Care Provider: Yves Dill Other Clinician: Referring Provider: Treating Provider/Extender: Luan Moore, Neelam Weeks in Treatment: 3 Subjective Chief Complaint Information obtained from Patient Bilateral LE  Ulcers History of Present Illness (HPI) 06-19-2022 upon evaluation today patient appears to be doing somewhat poorly in regard to a wound that began around Christmas time on the patient's ankle and lower extremities bilaterally. He tells me that he has been having quite significant pain at this point unfortunately. He did go to the emergency department on March 2. However this has  been going on since around Christmas. On March 2 he had x-rays that were negative and I did review that today as well. The patient also has what appears to be chronic venous stasis which is an ongoing issue for him here as well and I think that is part of the issue. Right now it is just hard for him to wear anything compression wise although long-term I think he is going to need something. He does smoke currently that something that he needs to stop. Patient does have a history of diabetes mellitus type 2 which is stated to be diet controlled. He also again is a current smoker which I think is something that he needs to get rid of completely. 06-26-2022 upon evaluation today patient appears to be doing well currently in regard to his wounds all things considered he tells me the pain is a little bit better he has been on the correct antibiotic for only 2 days so he is seeing some improvement already this is already a good sign. Fortunately there does not appear to be any signs of infection locally nor systemically which is great news. No fevers, chills, nausea, vomiting, or diarrhea. 07-03-2022 upon evaluation today patient appears to be doing better in regard to his leg the infection is clearing very well and I am actually much more pleased today with where things stand compared to where they were. Fortunately I do not see any signs of active infection locally or systemically at this time which is great news. No fevers, chills, nausea, vomiting, or diarrhea. 07-10-2022 upon evaluation today patient appears to be doing better I  do not see any signs of infection I think he is much better in that regard. Fortunately I do not see any evidence of active infection locally or systemically which is great news. Objective Constitutional Well-nourished and well-hydrated in no acute distress. Vanwagner, [name] ([id number]) [id number] Page 5 of 7 Vitals Time Taken: 10:47 AM, Height: 70 in, Weight: 180 lbs, BMI: 25.8, Temperature: 97.7 F, Pulse: 97 bpm, Respiratory Rate: 18 breaths/min, Blood Pressure: 122/76 mmHg. Respiratory normal breathing without difficulty. Psychiatric this patient is able to make decisions and demonstrates good insight into disease process. Alert and Oriented x 3. pleasant and cooperative. General Notes: Upon inspection patient's wound bed showed signs of excellent granulation epithelization at this point. I do think that we are moving in the right direction and I think that he is really doing quite well with regard to his wounds he still has some slough and biofilm buildup he is also still very swollen we will get a try to wrap him today and see how this does. Will see him back for nurse visit on Monday. Integumentary (Hair, Skin) Wound #1 status is Open. Original cause of wound was Gradually Appeared. The date acquired was: 03/24/2022. The wound has been in treatment 3 weeks. The wound is located on the Left,Circumferential Lower Leg. The wound measures 6cm length x 14.5cm width x 0.1cm depth; 68.33cm^2 area and 6.833cm^3 volume. There is Fat Layer (Subcutaneous Tissue) exposed. There is no tunneling or undermining noted. There is a medium amount of serosanguineous drainage noted. There is small (1-33%) red, pink granulation within the wound bed. There is a large (67-100%) amount of necrotic tissue within the wound bed including Adherent Slough. Wound #2 status is Open. Original cause of wound was Gradually Appeared. The date acquired was: 03/24/2022. The wound has been in treatment  3 weeks. The wound is located on the Right,Lateral Ankle.  The wound measures 3.2cm length x 0.9cm width x 0.2cm depth; 2.262cm^2 area and 0.452cm^3 volume. There is Fat Layer (Subcutaneous Tissue) exposed. There is no tunneling or undermining noted. There is a medium amount of serosanguineous drainage noted. There is medium (34-66%) red, pink granulation within the wound bed. There is a medium (34-66%) amount of necrotic tissue within the wound bed including Adherent Slough. Wound #3 status is Open. Original cause of wound was Gradually Appeared. The date acquired was: 03/24/2022. The wound has been in treatment 3 weeks. The wound is located on the Right,Medial Ankle. The wound measures 0.5cm length x 0.5cm width x 0.1cm depth; 0.196cm^2 area and 0.02cm^3 volume. There is no tunneling or undermining noted. There is a medium amount of serosanguineous drainage noted. There is no granulation within the wound bed. There is a large (67-100%) amount of necrotic tissue within the wound bed including Adherent Slough. 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 There was a Double Layer Compression Therapy Procedure by Angelina Pih, RN. Post procedure Diagnosis Wound #1: Same as Pre-Procedure Notes: urgo lite small used. Wound #2 Pre-procedure diagnosis of Wound #2 is a Venous Leg Ulcer located on the Right,Lateral Ankle . There was a Double Layer Compression Therapy Procedure by Angelina Pih, RN. Post procedure Diagnosis  Wound #2: Same as Pre-Procedure Notes: urgo lite small used. Plan Follow-up Appointments: Return Appointment in 1 week. Bathing/ Shower/ Hygiene: May shower; gently cleanse wound with antibacterial soap, rinse and pat dry prior to dressing wounds No tub bath. Edema Control - Lymphedema / Segmental Compressive Device / Other: 3 Layer Compression System for Lymphedema. - Urgo 2 layer wrap used size small Elevate, Exercise Daily and Avoid Standing for Long Periods of Time. Elevate legs to the level of the heart and pump ankles as often as possible Elevate leg(s) parallel to the floor when sitting. DO YOUR BEST to sleep in the bed at night. DO NOT sleep in your recliner. Long hours of sitting in a recliner leads to swelling of the legs and/or potential wounds on your backside. Medications-Please add to medication list.: P.O. Antibiotics - continue taking cipro as prescribed WOUND #1: - Lower Leg Wound Laterality: Left, Circumferential Cleanser: Byram Ancillary Kit - 15 Day Supply (Generic) 1 x Per Week/30 Days Bienaime, Uchenna ([id number]) [id number] Page 6 of 7 Discharge Instructions: Use supplies as instructed; Kit contains: (15) Saline Bullets; (15) 3x3 Gauze; 15 pr Gloves Cleanser: Soap and Water 1 x Per Week/30 Days Discharge Instructions: Gently cleanse wound with antibacterial soap, rinse and pat dry prior to dressing wounds Cleanser: Vashe 5.8 (oz) 1 x Per Week/30 Days Discharge Instructions: Use vashe 5.8 (oz) as directed Topical: Triamcinolone Acetonide Cream, 0.1%, 15 (g) tube 1 x Per Week/30 Days Discharge Instructions: Apply as directed by provider. TO LEG Prim Dressing: Silvercel 4 1/4x 4 1/4 (in/in) (Generic) 1 x Per Week/30 Days ary Discharge Instructions: Apply Silvercel 4 1/4x 4 1/4 (in/in) as instructed Secondary Dressing: Zetuvit Plus 4x4 (in/in) 1 x Per Week/30 Days Com pression Wrap: Urgo K2 Lite, two layer compression system, regular 1 x Per  Week/30  Days WOUND #2: - Ankle Wound Laterality: Right, Lateral Cleanser: Byram Ancillary Kit - 15 Day Supply (Generic) 1 x Per Week/30 Days Discharge Instructions: Use supplies as instructed; Kit contains: (15) Saline Bullets; (15) 3x3 Gauze; 15 pr Gloves Cleanser: Soap and Water 1 x Per Week/30 Days Discharge Instructions: Gently cleanse wound with antibacterial soap, rinse and pat dry prior to dressing wounds Cleanser: Vashe 5.8 (oz) 1 x Per Week/30 Days Discharge Instructions: Use vashe 5.8 (oz) as directed Topical: Triamcinolone Acetonide Cream, 0.1%, 15 (g) tube 1 x Per Week/30 Days Discharge Instructions: Apply as directed by provider. TO LEG Prim Dressing: Silvercel 4 1/4x 4 1/4 (in/in) (Generic) 1 x Per Week/30 Days ary Discharge Instructions: Apply Silvercel 4 1/4x 4 1/4 (in/in) as instructed Secondary Dressing: Zetuvit Plus 4x4 (in/in) 1 x Per Week/30 Days Com pression Wrap: Urgo K2 Lite, two layer compression system, regular 1 x Per Week/30 Days WOUND #3: - Ankle Wound Laterality: Right, Medial Cleanser: Byram Ancillary Kit - 15 Day Supply (Generic) 1 x Per Week/30 Days Discharge Instructions: Use supplies as instructed; Kit contains: (15) Saline Bullets; (15) 3x3 Gauze; 15 pr Gloves Cleanser: Soap and Water 1 x Per Week/30 Days Discharge Instructions: Gently cleanse wound with antibacterial soap, rinse and pat dry prior to dressing wounds Cleanser: Vashe 5.8 (oz) 1 x Per Week/30 Days Discharge Instructions: Use vashe 5.8 (oz) as directed Topical: Triamcinolone Acetonide Cream, 0.1%, 15 (g) tube 1 x Per Week/30 Days Discharge Instructions: Apply as directed by provider. TO LEG Prim Dressing: Silvercel 4 1/4x 4 1/4 (in/in) (Generic) 1 x Per Week/30 Days ary Discharge Instructions: Apply Silvercel 4 1/4x 4 1/4 (in/in) as instructed Secondary Dressing: Zetuvit Plus 4x4 (in/in) 1 x Per Week/30 Days Com pression Wrap: Urgo K2 Lite, two layer compression system, regular 1 x Per  Week/30 Days 1. I would recommend currently that we have the patient continue to monitor for any signs of infection or worsening. Based on what I am seeing I do believe that he would benefit from a continuation of therapy with the antibiotics which she is pretty much done with at this point this is good news. I am also can recommend that we continue with the triamcinolone to the leg and then subsequently were going to be using the silver cell which I think is doing a good job here pretty much across the board for all wounds. 2. I am also can recommend the patient should continue to elevate his legs much as possible and we will subsequently see where we stand at follow-up next week. We will see patient back for reevaluation in 1 week here in the clinic. If anything worsens or changes patient will contact our office for additional recommendations. Electronic Signature(s) Signed: 07/10/2022 5:02:05 PM By: Allen Derry PA-C Entered By: Allen Derry on 07/10/2022 17:02:05 -------------------------------------------------------------------------------- SuperBill Details Patient Name: Date of Service: [name], [name] 07/10/2022 Medical Record Number: [id number] Patient Account Number: [id number] Date of Birth/Sex: Treating RN: Aug 06, 1960 (62 y.o. [name] Primary Care Provider: Yves Dill Other Clinician: Referring Provider: Treating Provider/Extender: Luan Moore, Neelam Weeks in Treatment: 3 Diagnosis Coding ICD-10 Codes Code Description [phone] Chronic venous hypertension (idiopathic) with ulcer and inflammation of bilateral lower extremity L97.312 Non-pressure chronic ulcer of right ankle with fat layer exposed L97.822 Non-pressure chronic ulcer of other part of left lower leg with fat layer exposed F17.218 Nicotine dependence, cigarettes, with other nicotine-induced disorders E11.622 Type 2 diabetes mellitus with other skin ulcer Facility Procedures :  JOESPH, MARCY: Code  [id number] CK ([id number] Description: 1 BILATERAL: Application of multi-layer venous compression system; leg (below knee), including ankle and ) [id number]. Modifier: [id number].pdf P Quantity: 1 age 63 of 9 Physician Procedures : CPT4 Code Description Modifier [phone] - WC PHYS LEVEL 3 - EST PT ICD-10 Diagnosis Description I87.333 Chronic venous hypertension (idiopathic) with ulcer and inflammation of bilateral lower extremity L97.312 Non-pressure chronic ulcer of right  ankle with fat layer exposed L97.822 Non-pressure chronic ulcer of other part of left lower leg with fat layer exposed F17.218 Nicotine dependence, cigarettes, with other nicotine-induced disorders Quantity: 1 Electronic Signature(s) Signed: 07/10/2022 5:02:17 PM By: Allen Derry PA-C Previous Signature: 07/10/2022 12:29:28 PM Version By: Angelina Pih Entered By: Allen Derry on 07/10/2022 17:02:17

## 2022-07-11 NOTE — Progress Notes (Signed)
Macke, Campbell ([id number]) [id number] Page 1 of 11 Visit Report for 07/10/2022 Arrival Information Details Patient Name: Date of Service: [name], [name] 07/10/2022 10:30 A M Medical Record Number: [id number] Patient Account Number: [id number] Date of Birth/Sex: Treating RN: 11/30/1960 (62 y.o. [name] Primary Care Alandis Bluemel: Yves Dill Other Clinician: Referring Kainat Pizana: Treating Yittel Emrich/Extender: Luan Moore, Neelam Weeks in Treatment: 3 Visit Information History Since Last Visit Added or deleted any medications: No Patient Arrived: Ambulatory Any new allergies or adverse reactions: No Arrival Time: 10:46 Had a fall or experienced change in No Accompanied By: self activities of daily living that may affect Transfer Assistance: None risk of falls: Patient Identification Verified: Yes Hospitalized since last visit: No Secondary Verification Process Completed: Yes Has Dressing in Place as Prescribed: Yes Has Compression in Place as Prescribed: Yes Pain Present Now: Yes Electronic Signature(s) Signed: 07/10/2022 5:07:08 PM By: Angelina Pih Entered By: Angelina Pih on 07/10/2022 10:49:24 -------------------------------------------------------------------------------- Clinic Level of Care Assessment Details Patient Name: Date of Service: [name], [name] 07/10/2022 10:30 A M Medical Record Number: [id number] Patient Account Number: [id number] Date of Birth/Sex: Treating RN: 09-28-1960 (62 y.o. [name] Primary Care Gianny Killman: Yves Dill Other Clinician: Referring Merritt Mccravy: Treating Brinleigh Tew/Extender: Luan Moore, Neelam Weeks in Treatment: 3 Clinic Level of Care Assessment Items TOOL 1 Quantity Score  - 0 Use when EandM and Procedure is performed on INITIAL visit ASSESSMENTS - Nursing Assessment / Reassessment  - 0 General Physical Exam (combine w/ comprehensive assessment (listed just below) when performed on new pt.  evals)  - 0 Comprehensive Assessment (HX, ROS, Risk Assessments, Wounds Hx, etc.) ASSESSMENTS - Wound and Skin Assessment / Reassessment  - 0 Dermatologic / Skin Assessment (not related to wound area) ASSESSMENTS - Ostomy and/or Continence Assessment and Care  - 0 Incontinence Assessment and Management  - 0 Ostomy Care Assessment and Management (repouching, etc.) PROCESS - Coordination of Care  - 0 Simple Patient / Family Education for ongoing care  - 0 Complex (extensive) Patient / Family Education for ongoing care  - 0 Staff obtains Chiropractor, Records, T Results / Process Orders est  - 0 Staff telephones HHA, Nursing Homes / Clarify orders / etc  - 0 Routine Transfer to another Facility (non-emergent condition)  - 0 Routine Hospital Admission (non-emergent condition)  - 0 New Admissions / Manufacturing engineer / Ordering NPWT Apligraf, etc. ,  - 0 Emergency Hospital Admission (emergent condition) PROCESS - Special Needs Boeder, Rolland ([id number]) [id number] Page 2 of 11  - 0 Pediatric / Minor Patient Management  - 0 Isolation Patient Management  - 0 Hearing / Language / Visual special needs  - 0 Assessment of Community assistance (transportation, D/C planning, etc.)  - 0 Additional assistance / Altered mentation  - 0 Support Surface(s) Assessment (bed, cushion, seat, etc.) INTERVENTIONS - Miscellaneous  - 0 External ear exam  - 0 Patient Transfer (multiple staff / Nurse, adult / Similar devices)  - 0 Simple Staple / Suture removal (25 or less)  - 0 Complex Staple / Suture removal (26 or more)  - 0 Hypo/Hyperglycemic Management (do not check if billed separately)  - 0 Ankle / Brachial Index (ABI) - do not check if billed separately Has the patient been seen at the hospital within the last three years: Yes Total Score: 0 Level Of Care: ____ Electronic Signature(s) Signed: 07/10/2022  5:07:08 PM By: Angelina Pih Entered By: Angelina Pih on 07/10/2022 12:29:18 -------------------------------------------------------------------------------- Compression Therapy Details Patient Name:  Date of Service: [name], [name] 07/10/2022 10:30 A M Medical Record Number: [id number] Patient Account Number: [id number] Date of Birth/Sex: Treating RN: 08-18-1960 (63 y.o. [name] Primary Care Javonne Dorko: Yves Dill Other Clinician: Referring Kensleigh Gates: Treating Ace Bergfeld/Extender: Luan Moore, Neelam Weeks in Treatment: 3 Compression Therapy Performed for Wound Assessment: Wound #1 Left,Circumferential Lower Leg Performed By: Clinician Angelina Pih, RN Compression Type: Double Layer Post Procedure Diagnosis Same as Pre-procedure Notes urgo lite small used Electronic Signature(s) Signed: 07/10/2022 12:27:09 PM By: Angelina Pih Entered By: Angelina Pih on 07/10/2022 12:27:09 -------------------------------------------------------------------------------- Compression Therapy Details Patient Name: Date of Service: [name], [name] 07/10/2022 10:30 A M Medical Record Number: [id number] Patient Account Number: [id number] Date of Birth/Sex: Treating RN: 05/05/1960 (62 y.o. [name] Primary Care Karenann Mcgrory: Yves Dill Other Clinician: Referring Daishia Fetterly: Treating Alpha Mysliwiec/Extender: Luan Moore, Neelam Weeks in Treatment: 3 Compression Therapy Performed for Wound Assessment: Wound #2 Right,Lateral Ankle Performed By: Clinician Angelina Pih, RN Compression Type: Double Layer Post Procedure Diagnosis Same as Pre-procedure Malcolm, [name] ([id number]) [id number] Page 3 of 11 Notes urgo lite small used Psychologist, prison and probation services) Signed: 07/10/2022 12:27:36 PM By: Angelina Pih Entered By: Angelina Pih on 07/10/2022 12:27:36 -------------------------------------------------------------------------------- Encounter Discharge  Information Details Patient Name: Date of Service: [name], [name] 07/10/2022 10:30 A M Medical Record Number: [id number] Patient Account Number: [id number] Date of Birth/Sex: Treating RN: 03-Sep-1960 (62 y.o. [name] Primary Care Cadynce Garrette: Yves Dill Other Clinician: Referring Kunaal Walkins: Treating Thalia Turkington/Extender: Luan Moore, Neelam Weeks in Treatment: 3 Encounter Discharge Information Items Discharge Condition: Stable Ambulatory Status: Ambulatory Discharge Destination: Home Transportation: Private Auto Accompanied By: self Schedule Follow-up Appointment: Yes Clinical Summary of Care: Electronic Signature(s) Signed: 07/10/2022 12:33:22 PM By: Angelina Pih Entered By: Angelina Pih on 07/10/2022 12:33:22 -------------------------------------------------------------------------------- Lower Extremity Assessment Details Patient Name: Date of Service: [name], [name] 07/10/2022 10:30 A M Medical Record Number: [id number] Patient Account Number: [id number] Date of Birth/Sex: Treating RN: November 29, 1960 (62 y.o. [name] Primary Care Sidonia Nutter: Yves Dill Other Clinician: Referring Halton Neas: Treating Shawanna Zanders/Extender: Luan Moore, Neelam Weeks in Treatment: 3 Edema Assessment Assessed: [Left: No] [Right: No] Edema: [Left: Yes] [Right: Yes] Calf Left: Right: Point of Measurement: 33 cm From Medial Instep 35.2 cm 36 cm Ankle Left: Right: Point of Measurement: 12 cm From Medial Instep 23 cm 23.5 cm Vascular Assessment Pulses: Dorsalis Pedis Palpable: [Left:Yes] [Right:Yes] Posterior Tibial Palpable: [Left:Yes] [Right:Yes] Electronic Signature(s) Signed: 07/10/2022 5:07:08 PM By: Angelina Pih Entered By: Angelina Pih on 07/10/2022 11:50:02 Dinges, [name] ([id number]) [id number] Page 4 of 11 -------------------------------------------------------------------------------- Multi Wound Chart Details Patient Name: Date of  Service: [name], [name] 07/10/2022 10:30 A M Medical Record Number: [id number] Patient Account Number: [id number] Date of Birth/Sex: Treating RN: [birth year] (62 y.o. [name] Primary Care Cassara Nida: Yves Dill Other Clinician: Referring Jabril Pursell: Treating Everlene Cunning/Extender: Luan Moore, Neelam Weeks in Treatment: 3 Vital Signs Height(in): 70 Pulse(bpm): 97 Weight(lbs): 180 Blood Pressure(mmHg): 122/76 Body Mass Index(BMI): 25.8 Temperature(F): 97.7 Respiratory Rate(breaths/min): 18 [1:Photos:] Left, Circumferential Lower Leg Right, Lateral Ankle Right, Medial Ankle Wound Location: Gradually Appeared Gradually Appeared Gradually Appeared Wounding Event: Venous Leg Ulcer Venous Leg Ulcer Venous Leg Ulcer Primary Etiology: Rheumatoid Arthritis Rheumatoid Arthritis Rheumatoid Arthritis Comorbid History: 03/24/2022 03/24/2022 03/24/2022 Date Acquired: 3 3 3  Weeks of Treatment: Open Open Open Wound Status: No No No Wound Recurrence: 6x14.5x0.1 3.2x0.9x0.2 0.5x0.5x0.1 Measurements L x W x D (cm) 68.33 2.262 0.196 A (cm) : rea 6.833 0.452 0.02  Volume (cm) : 26.90% 37.70% 0.00% % Reduction in A rea: 26.90% 37.70% 0.00% % Reduction in Volume: Full Thickness Without Exposed Full Thickness Without Exposed Full Thickness Without Exposed Classification: Support Structures Support Structures Support Structures Medium Medium Medium Exudate A mount: Serosanguineous Serosanguineous Serosanguineous Exudate Type: red, brown red, brown red, brown Exudate Color: Small (1-33%) Medium (34-66%) None Present (0%) Granulation A mount: Red, Pink Red, Pink N/A Granulation Quality: Large (67-100%) Medium (34-66%) Large (67-100%) Necrotic A mount: Fat Layer (Subcutaneous Tissue): Yes Fat Layer (Subcutaneous Tissue): Yes Fat Layer (Subcutaneous Tissue): No Exposed Structures: None None None Epithelialization: Treatment Notes Electronic Signature(s) Signed: 07/10/2022  12:26:43 PM By: Angelina Pih Entered By: Angelina Pih on 07/10/2022 12:26:42 -------------------------------------------------------------------------------- Multi-Disciplinary Care Plan Details Patient Name: Date of Service: [name], [name] 07/10/2022 10:30 A M Medical Record Number: [id number] Patient Account Number: [id number] Date of Birth/Sex: Treating RN: May 15, 1960 (62 y.o. [name] [place], [name] ([id number]) [id number] Page 5 of 11 Primary Care Braylie Badami: Yves Dill Other Clinician: Referring Miachel Nardelli: Treating Moises Terpstra/Extender: Luan Moore, Neelam Weeks in Treatment: 3 Active Inactive Necrotic Tissue Nursing Diagnoses: Impaired tissue integrity related to necrotic/devitalized tissue Goals: Necrotic/devitalized tissue will be minimized in the wound bed Date Initiated: 07/03/2022 Date Inactivated: 07/03/2022 Target Resolution Date: 07/03/2022 Goal Status: Met Patient/caregiver will verbalize understanding of reason and process for debridement of necrotic tissue Date Initiated: 07/03/2022 Target Resolution Date: 07/11/2022 Goal Status: Active Interventions: Assess patient pain level pre-, during and post procedure and prior to discharge Provide education on necrotic tissue and debridement process Treatment Activities: Apply topical anesthetic as ordered : 07/03/2022 Notes: Venous Leg Ulcer Nursing Diagnoses: Actual venous Insuffiency (use after diagnosis is confirmed) Goals: Patient will maintain optimal edema control Date Initiated: 07/10/2022 Target Resolution Date: 08/05/2022 Goal Status: Active Patient/caregiver will verbalize understanding of disease process and disease management Date Initiated: 07/10/2022 Target Resolution Date: 08/05/2022 Goal Status: Active Verify adequate tissue perfusion prior to therapeutic compression application Date Initiated: 07/10/2022 Date Inactivated: 07/10/2022 Target Resolution Date: 07/10/2022 Goal  Status: Met Interventions: Assess peripheral edema status every visit. Compression as ordered Provide education on venous insufficiency Treatment Activities: Therapeutic compression applied : 07/10/2022 Notes: Wound/Skin Impairment Nursing Diagnoses: Impaired tissue integrity Knowledge deficit related to ulceration/compromised skin integrity Goals: Ulcer/skin breakdown will have a volume reduction of 30% by week 4 Date Initiated: 06/19/2022 Target Resolution Date: 07/17/2022 Goal Status: Active Ulcer/skin breakdown will have a volume reduction of 50% by week 8 Date Initiated: 06/19/2022 Target Resolution Date: 08/14/2022 Goal Status: Active Ulcer/skin breakdown will have a volume reduction of 80% by week 12 Date Initiated: 06/19/2022 Target Resolution Date: 09/11/2022 Goal Status: Active Ulcer/skin breakdown will heal within 14 weeks Date Initiated: 06/19/2022 Target Resolution Date: 09/25/2022 Goal Status: Active Interventions: Forstrom, Debra ([id number]) [id number] Page 6 of 11 Assess patient/caregiver ability to obtain necessary supplies Assess patient/caregiver ability to perform ulcer/skin care regimen upon admission and as needed Assess ulceration(s) every visit Provide education on ulcer and skin care Treatment Activities: Referred to DME Reannon Candella for dressing supplies : 06/19/2022 Skin care regimen initiated : 06/19/2022 Notes: Electronic Signature(s) Signed: 07/10/2022 12:31:51 PM By: Angelina Pih Entered By: Angelina Pih on 07/10/2022 12:31:51 -------------------------------------------------------------------------------- Pain Assessment Details Patient Name: Date of Service: [name], [name] 07/10/2022 10:30 A M Medical Record Number: [id number] Patient Account Number: [id number] Date of Birth/Sex: Treating RN: 03-17-1961 (62 y.o. [name] Primary Care Maryagnes Carrasco: Yves Dill Other Clinician: Referring Richardson Dubree: Treating  Andrw Mcguirt/Extender: Luan Moore, Neelam Weeks in Treatment:  3 Active Problems Location of Pain Severity and Description of Pain Patient Has Paino Yes Site Locations Rate the pain. Current Pain Level: 5 Pain Management and Medication Current Pain Management: Notes pt states pain at wound sites to bilat LE Electronic Signature(s) Signed: 07/10/2022 5:07:08 PM By: Angelina Pih Entered By: Angelina Pih on 07/10/2022 10:49:18 -------------------------------------------------------------------------------- Patient/Caregiver Education Details Patient Name: Date of Service: [name], [name] 4/11/2024andnbsp10:30 A M Medical Record Number: [id number] Patient Account Number: [id number] Date of Birth/Gender: Treating RN: Apr 09, 1960 (62 y.o. [name] Primary Care Physician: Yves Dill Other Clinician: Referring Physician: Treating Physician/Extender: Luan Moore, Neelam Weeks in Treatment: 3 Hoaglin, [name] ([id number]) [id number] Page 7 of 11 Education Assessment Education Provided To: Patient Education Topics Provided Offloading: Handouts: Other: bilat LE elevation Methods: Explain/Verbal Responses: State content correctly Venous: Handouts: Controlling Swelling with Multilayered Compression Wraps Methods: Explain/Verbal Responses: State content correctly Wound/Skin Impairment: Handouts: Caring for Your Ulcer Methods: Explain/Verbal Responses: State content correctly Electronic Signature(s) Signed: 07/10/2022 5:07:08 PM By: Angelina Pih Entered By: Angelina Pih on 07/10/2022 12:32:42 -------------------------------------------------------------------------------- Wound Assessment Details Patient Name: Date of Service: [name], [name] 07/10/2022 10:30 A M Medical Record Number: [id number] Patient Account Number: [id number] Date of Birth/Sex: Treating RN: [birth year] (62 y.o. [name] Primary Care Carigan Lister: Yves Dill Other  Clinician: Referring Earnestene Angello: Treating Dewon Mendizabal/Extender: Luan Moore, Neelam Weeks in Treatment: 3 Wound Status Wound Number: 1 Primary Etiology: Venous Leg Ulcer Wound Location: Left, Circumferential Lower Leg Wound Status: Open Wounding Event: Gradually Appeared Comorbid History: Rheumatoid Arthritis Date Acquired: 03/24/2022 Weeks Of Treatment: 3 Clustered Wound: No Photos Wound Measurements Length: (cm) 6 Width: (cm) 14.5 Depth: (cm) 0.1 Area: (cm) 68.33 Volume: (cm) 6.833 % Reduction in Area: 26.9% % Reduction in Volume: 26.9% Epithelialization: None Tunneling: No Undermining: No Wound Description Classification: Full Thickness Without Exposed Support Structures Exudate Amount: Medium Exudate Type: Serosanguineous Exudate Color: red, brown Dietzman, Audiel ([id number]) Foul Odor After Cleansing: No Slough/Fibrino Yes [id number] Page 8 of 11 Wound Bed Granulation Amount: Small (1-33%) Exposed Structure Granulation Quality: Red, Pink Fat Layer (Subcutaneous Tissue) Exposed: Yes Necrotic Amount: Large (67-100%) Necrotic Quality: Adherent Slough Treatment Notes Wound #1 (Lower Leg) Wound Laterality: Left, Circumferential Cleanser Byram Ancillary Kit - 15 Day Supply Discharge Instruction: Use supplies as instructed; Kit contains: (15) Saline Bullets; (15) 3x3 Gauze; 15 pr Gloves Soap and Water Discharge Instruction: Gently cleanse wound with antibacterial soap, rinse and pat dry prior to dressing wounds Vashe 5.8 (oz) Discharge Instruction: Use vashe 5.8 (oz) as directed Peri-Wound Care Topical Primary Dressing Silvercel 4 1/4x 4 1/4 (in/in) Discharge Instruction: Apply Silvercel 4 1/4x 4 1/4 (in/in) as instructed Secondary Dressing Zetuvit Plus 4x4 (in/in) Secured With Compression Wrap Urgo K2 Lite, two layer compression system, regular Compression Stockings Add-Ons Electronic Signature(s) Signed: 07/10/2022 5:07:08 PM By:  Angelina Pih Entered By: Angelina Pih on 07/10/2022 11:02:45 -------------------------------------------------------------------------------- Wound Assessment Details Patient Name: Date of Service: [name], [name] 07/10/2022 10:30 A M Medical Record Number: [id number] Patient Account Number: [id number] Date of Birth/Sex: Treating RN: 06-22-1960 (62 y.o. [name] Primary Care Avyan Livesay: Yves Dill Other Clinician: Referring Anysha Frappier: Treating Jaclyn Carew/Extender: Luan Moore, Neelam Weeks in Treatment: 3 Wound Status Wound Number: 2 Primary Etiology: Venous Leg Ulcer Wound Location: Right, Lateral Ankle Wound Status: Open Wounding Event: Gradually Appeared Comorbid History: Rheumatoid Arthritis Date Acquired: 03/24/2022 Weeks Of Treatment: 3 Clustered Wound: No Photos Schack, Dael ([id number]) [id number] Page 9 of 11 Wound Measurements Length: (cm) 3.2  Width: (cm) 0.9 Depth: (cm) 0.2 Area: (cm) 2.262 Volume: (cm) 0.452 % Reduction in Area: 37.7% % Reduction in Volume: 37.7% Epithelialization: None Tunneling: No Undermining: No Wound Description Classification: Full Thickness Without Exposed Suppor Exudate Amount: Medium Exudate Type: Serosanguineous Exudate Color: red, brown t Structures Foul Odor After Cleansing: No Slough/Fibrino Yes Wound Bed Granulation Amount: Medium (34-66%) Exposed Structure Granulation Quality: Red, Pink Fat Layer (Subcutaneous Tissue) Exposed: Yes Necrotic Amount: Medium (34-66%) Necrotic Quality: Adherent Slough Treatment Notes Wound #2 (Ankle) Wound Laterality: Right, Lateral Cleanser Byram Ancillary Kit - 15 Day Supply Discharge Instruction: Use supplies as instructed; Kit contains: (15) Saline Bullets; (15) 3x3 Gauze; 15 pr Gloves Soap and Water Discharge Instruction: Gently cleanse wound with antibacterial soap, rinse and pat dry prior to dressing wounds Vashe 5.8 (oz) Discharge  Instruction: Use vashe 5.8 (oz) as directed Peri-Wound Care Topical Primary Dressing Silvercel 4 1/4x 4 1/4 (in/in) Discharge Instruction: Apply Silvercel 4 1/4x 4 1/4 (in/in) as instructed Secondary Dressing Zetuvit Plus 4x4 (in/in) Secured With Compression Wrap Urgo K2 Lite, two layer compression system, regular Compression Stockings Add-Ons Electronic Signature(s) Signed: 07/10/2022 5:07:08 PM By: Angelina Pih Entered By: Angelina Pih on 07/10/2022 11:03:20 -------------------------------------------------------------------------------- Wound Assessment Details Patient Name: Date of Service: [name], [name] 07/10/2022 10:30 A M Medical Record Number: [id number] Patient Account Number: [id number] MICHA, VANVLIET ([id number]) [id number].pdf Page 10 of 11 Date of Birth/Sex: Treating RN: 1961-03-04 (62 y.o. [name] Primary Care Colbi Staubs: Other Clinician: Yves Dill Referring Richie Vadala: Treating Nyala Kirchner/Extender: Luan Moore, Neelam Weeks in Treatment: 3 Wound Status Wound Number: 3 Primary Etiology: Venous Leg Ulcer Wound Location: Right, Medial Ankle Wound Status: Open Wounding Event: Gradually Appeared Comorbid History: Rheumatoid Arthritis Date Acquired: 03/24/2022 Weeks Of Treatment: 3 Clustered Wound: No Photos Wound Measurements Length: (cm) 0.5 Width: (cm) 0.5 Depth: (cm) 0.1 Area: (cm) 0.196 Volume: (cm) 0.02 % Reduction in Area: 0% % Reduction in Volume: 0% Epithelialization: None Tunneling: No Undermining: No Wound Description Classification: Full Thickness Without Exposed Suppor Exudate Amount: Medium Exudate Type: Serosanguineous Exudate Color: red, brown t Structures Foul Odor After Cleansing: No Slough/Fibrino Yes Wound Bed Granulation Amount: None Present (0%) Exposed Structure Necrotic Amount: Large (67-100%) Fat Layer (Subcutaneous Tissue) Exposed: No Necrotic Quality: Adherent Slough Treatment  Notes Wound #3 (Ankle) Wound Laterality: Right, Medial Cleanser Byram Ancillary Kit - 15 Day Supply Discharge Instruction: Use supplies as instructed; Kit contains: (15) Saline Bullets; (15) 3x3 Gauze; 15 pr Gloves Soap and Water Discharge Instruction: Gently cleanse wound with antibacterial soap, rinse and pat dry prior to dressing wounds Vashe 5.8 (oz) Discharge Instruction: Use vashe 5.8 (oz) as directed Peri-Wound Care Topical Primary Dressing Silvercel 4 1/4x 4 1/4 (in/in) Discharge Instruction: Apply Silvercel 4 1/4x 4 1/4 (in/in) as instructed Secondary Dressing Zetuvit Plus 4x4 (in/in) Secured With Compression Wrap Urgo K2 Lite, two layer compression system, regular Compression Stockings Cleere, Jaze ([id number]) [id number] Page 11 of 11 Add-Ons Electronic Signature(s) Signed: 07/10/2022 5:07:08 PM By: Angelina Pih Entered By: Angelina Pih on 07/10/2022 11:03:48 -------------------------------------------------------------------------------- Vitals Details Patient Name: Date of Service: [name], [name] 07/10/2022 10:30 A M Medical Record Number: [id number] Patient Account Number: [id number] Date of Birth/Sex: Treating RN: 12-23-1960 (62 y.o. [name] Primary Care Fritz Cauthon: Yves Dill Other Clinician: Referring Arlyne Brandes: Treating Jeraldean Wechter/Extender: Luan Moore, Neelam Weeks in Treatment: 3 Vital Signs Time Taken: 10:47 Temperature (F): 97.7 Height (in): 70 Pulse (bpm): 97 Weight (lbs): 180 Respiratory Rate (breaths/min): 18 Body Mass Index (BMI): 25.8  Blood Pressure (mmHg): 122/76 Reference Range: 80 - 120 mg / dl Electronic Signature(s) Signed: 07/10/2022 5:07:08 PM By: Angelina Pih Entered By: Angelina Pih on 07/10/2022 10:48:51

## 2022-07-14 DIAGNOSIS — E11622 Type 2 diabetes mellitus with other skin ulcer: Secondary | ICD-10-CM | POA: Diagnosis not present

## 2022-07-17 ENCOUNTER — Encounter: Payer: 59 | Admitting: Physician Assistant

## 2022-07-17 DIAGNOSIS — E11622 Type 2 diabetes mellitus with other skin ulcer: Secondary | ICD-10-CM | POA: Diagnosis not present

## 2022-07-17 NOTE — Progress Notes (Addendum)
[name], [name] ([id number]) [id number] Page 1 of 7 Visit Report for 07/17/2022 Chief Complaint Document Details Patient Name: Date of Service: [name], [name] 07/17/2022 10:30 A M Medical Record Number: [id number] Patient Account Number: [id number] Date of Birth/Sex: Treating RN: 08/11/1960 (62 y.o. [name] Primary Care Provider: Yves Dill Other Clinician: Referring Provider: Treating Provider/Extender: Luan Moore, Neelam Weeks in Treatment: 4 Information Obtained from: Patient Chief Complaint Bilateral LE Ulcers Electronic Signature(s) Signed: 07/17/2022 11:31:10 AM By: Allen Derry PA-C Entered By: Allen Derry on 07/17/2022 11:31:10 -------------------------------------------------------------------------------- Debridement Details Patient Name: Date of Service: [name], [name] 07/17/2022 10:30 A M Medical Record Number: [id number] Patient Account Number: [id number] Date of Birth/Sex: Treating RN: 04-Feb-1961 (62 y.o. [name] Primary Care Provider: Yves Dill Other Clinician: Referring Provider: Treating Provider/Extender: Luan Moore, Neelam Weeks in Treatment: 4 Debridement Performed for Assessment: Wound #1 Left,Circumferential Lower Leg Performed By: Physician Allen Derry, PA-C Debridement Type: Debridement Severity of Tissue Pre Debridement: Fat layer exposed Level of Consciousness (Pre-procedure): Awake and Alert Pre-procedure Verification/Time Out Yes - 11:34 Taken: Pain Control: Lidocaine 4% T opical Solution T Area Debrided (L x W): otal 7 (cm) x 4 (cm) = 28 (cm) Tissue and other material debrided: Viable, Non-Viable, Slough, Subcutaneous, Biofilm, Slough Level: Skin/Subcutaneous Tissue Debridement Description: Excisional Instrument: Curette Bleeding: Minimum Hemostasis Achieved: Pressure Response to Treatment: Procedure was tolerated well Level of Consciousness (Post- Awake and Alert procedure): Post  Debridement Measurements of Total Wound Length: (cm) 13.1 Width: (cm) 7 Depth: (cm) 0.1 Volume: (cm) 7.202 Character of Wound/Ulcer Post Debridement: Stable Severity of Tissue Post Debridement: Fat layer exposed Post Procedure Diagnosis Same as Pre-procedure Electronic Signature(s) Signed: 07/17/2022 5:10:54 PM By: Angelina Pih Signed: 07/18/2022 1:44:50 PM By: Allen Derry PA-C Entered By: Angelina Pih on 07/17/2022 11:39:47 Barris, [name] ([id number]) [id number] Page 2 of 7 -------------------------------------------------------------------------------- HPI Details Patient Name: Date of Service: [name], [name] 07/17/2022 10:30 A M Medical Record Number: [id number] Patient Account Number: [id number] Date of Birth/Sex: Treating RN: Nov 11, 1960 (62 y.o. [name] Primary Care Provider: Yves Dill Other Clinician: Referring Provider: Treating Provider/Extender: Luan Moore, Neelam Weeks in Treatment: 4 History of Present Illness HPI Description: 06-19-2022 upon evaluation today patient appears to be doing somewhat poorly in regard to a wound that began around Christmas time on the patient's ankle and lower extremities bilaterally. He tells me that he has been having quite significant pain at this point unfortunately. He did go to the emergency department on March 2. However this has been going on since around Christmas. On March 2 he had x-rays that were negative and I did review that today as well. The patient also has what appears to be chronic venous stasis which is an ongoing issue for him here as well and I think that is part of the issue. Right now it is just hard for him to wear anything compression wise although long-term I think he is going to need something. He does smoke currently that something that he needs to stop. Patient does have a history of diabetes mellitus type 2 which is stated to be diet controlled. He also again is a current  smoker which I think is something that he needs to get rid of completely. 06-26-2022 upon evaluation today patient appears to be doing well currently in regard to his wounds all things considered he tells me the pain is a little bit better he has been on the correct antibiotic for only 2  days so he is seeing some improvement already this is already a good sign. Fortunately there does not appear to be any signs of infection locally nor systemically which is great news. No fevers, chills, nausea, vomiting, or diarrhea. 07-03-2022 upon evaluation today patient appears to be doing better in regard to his leg the infection is clearing very well and I am actually much more pleased today with where things stand compared to where they were. Fortunately I do not see any signs of active infection locally or systemically at this time which is great news. No fevers, chills, nausea, vomiting, or diarrhea. 07-10-2022 upon evaluation today patient appears to be doing better I do not see any signs of infection I think he is much better in that regard. Fortunately I do not see any evidence of active infection locally or systemically which is great news. 07-17-2022 upon evaluation today patient actually appears to be doing better in regard to his wounds. He is showing some signs of improvement we actually now able to wrap him and I do think he is now allow me to do some debridement today as well which will also be good news. Fortunately I do not see any evidence of active infection locally nor systemically which is great news. Electronic Signature(s) Signed: 07/18/2022 1:27:33 PM By: Allen Derry PA-C Entered By: Allen Derry on 07/18/2022 13:27:33 -------------------------------------------------------------------------------- Physical Exam Details Patient Name: Date of Service: [name], [name] 07/17/2022 10:30 A M Medical Record Number: [id number] Patient Account Number: [id number] Date of Birth/Sex: Treating RN: 18-Jul-1960  (62 y.o. [name] Primary Care Provider: Yves Dill Other Clinician: Referring Provider: Treating Provider/Extender: Luan Moore, Neelam Weeks in Treatment: 4 Constitutional Well-nourished and well-hydrated in no acute distress. Respiratory normal breathing without difficulty. Psychiatric this patient is able to make decisions and demonstrates good insight into disease process. Alert and Oriented x 3. pleasant and cooperative. Notes Upon inspection patient's wound bed actually did show signs of debridement needed and I did perform debridement to try to clearway as much of the necrotic debris as I could today. He tolerated that without complication and postdebridement the wound bed actually appears to be doing significantly better which is great news. Electronic Signature(s) Signed: 07/18/2022 1:27:54 PM By: Allen Derry PA-C Entered By: Allen Derry on 07/18/2022 13:27:53 -------------------------------------------------------------------------------- Physician Orders Details Patient Name: Date of Service: [name], [name] 07/17/2022 10:30 A M [name], [name] ([id number]) [id number] Page 3 of 7 Medical Record Number: [id number] Patient Account Number: [id number] Date of Birth/Sex: Treating RN: 07/05/1960 (62 y.o. [name] Primary Care Provider: Yves Dill Other Clinician: Referring Provider: Treating Provider/Extender: Luan Moore, Neelam Weeks in Treatment: 4 Verbal / Phone Orders: No Diagnosis Coding ICD-10 Coding Code Description [phone] Chronic venous hypertension (idiopathic) with ulcer and inflammation of bilateral lower extremity L97.312 Non-pressure chronic ulcer of right ankle with fat layer exposed L97.822 Non-pressure chronic ulcer of other part of left lower leg with fat layer exposed F17.218 Nicotine dependence, cigarettes, with other nicotine-induced disorders E11.622 Type 2 diabetes mellitus with other skin  ulcer Follow-up Appointments Return Appointment in 1 week. Bathing/ Shower/ Hygiene May shower; gently cleanse wound with antibacterial soap, rinse and pat dry prior to dressing wounds No tub bath. Edema Control - Lymphedema / Segmental Compressive Device / Other Bilateral Lower Extremities 3 Layer Compression System for Lymphedema. - Urgo 2 layer wrap used size regular Elevate, Exercise Daily and A void Standing for Long Periods of Time. Elevate legs to the level  of the heart and pump ankles as often as possible Elevate leg(s) parallel to the floor when sitting. DO YOUR BEST to sleep in the bed at night. DO NOT sleep in your recliner. Long hours of sitting in a recliner leads to swelling of the legs and/or potential wounds on your backside. Medications-Please add to medication list. ntibiotics - continue taking cipro as prescribed P.O. A Wound Treatment Wound #1 - Lower Leg Wound Laterality: Left, Circumferential Cleanser: Byram Ancillary Kit - 15 Day Supply (Generic) 1 x Per Week/30 Days Discharge Instructions: Use supplies as instructed; Kit contains: (15) Saline Bullets; (15) 3x3 Gauze; 15 pr Gloves Cleanser: Soap and Water 1 x Per Week/30 Days Discharge Instructions: Gently cleanse wound with antibacterial soap, rinse and pat dry prior to dressing wounds Cleanser: Vashe 5.8 (oz) 1 x Per Week/30 Days Discharge Instructions: Use vashe 5.8 (oz) as directed Topical: Triamcinolone Acetonide Cream, 0.1%, 15 (g) tube 1 x Per Week/30 Days Discharge Instructions: Apply as directed by provider. TO LEG Prim Dressing: Silvercel 4 1/4x 4 1/4 (in/in) (Generic) 1 x Per Week/30 Days ary Discharge Instructions: Apply Silvercel 4 1/4x 4 1/4 (in/in) as instructed Secondary Dressing: Zetuvit Plus 4x4 (in/in) 1 x Per Week/30 Days Compression Wrap: Urgo K2 Lite, two layer compression system, regular 1 x Per Week/30 Days Wound #2 - Ankle Wound Laterality: Right, Lateral Cleanser: Byram Ancillary Kit -  15 Day Supply (Generic) 1 x Per Week/30 Days Discharge Instructions: Use supplies as instructed; Kit contains: (15) Saline Bullets; (15) 3x3 Gauze; 15 pr Gloves Cleanser: Soap and Water 1 x Per Week/30 Days Discharge Instructions: Gently cleanse wound with antibacterial soap, rinse and pat dry prior to dressing wounds Cleanser: Vashe 5.8 (oz) 1 x Per Week/30 Days Discharge Instructions: Use vashe 5.8 (oz) as directed Topical: Triamcinolone Acetonide Cream, 0.1%, 15 (g) tube 1 x Per Week/30 Days Discharge Instructions: Apply as directed by provider. TO LEG Prim Dressing: Silvercel 4 1/4x 4 1/4 (in/in) (Generic) 1 x Per Week/30 Days ary Discharge Instructions: Apply Silvercel 4 1/4x 4 1/4 (in/in) as instructed Secondary Dressing: Zetuvit Plus 4x4 (in/in) 1 x Per Week/30 Days Compression Wrap: Urgo K2 Lite, two layer compression system, regular 1 x Per Week/30 Days [name], [name] ([id number]) [id number] Page 4 of 7 Electronic Signature(s) Signed: 07/17/2022 5:10:54 PM By: Angelina Pih Signed: 07/18/2022 1:44:50 PM By: Allen Derry PA-C Entered By: Angelina Pih on 07/17/2022 13:25:50 -------------------------------------------------------------------------------- Problem List Details Patient Name: Date of Service: [name], [name] 07/17/2022 10:30 A M Medical Record Number: [id number] Patient Account Number: [id number] Date of Birth/Sex: Treating RN: 05/12/1960 (62 y.o. [name] Primary Care Provider: Yves Dill Other Clinician: Referring Provider: Treating Provider/Extender: Luan Moore, Neelam Weeks in Treatment: 4 Active Problems ICD-10 Encounter Code Description Active Date MDM Diagnosis I87.333 Chronic venous hypertension (idiopathic) with ulcer and inflammation of 06/19/2022 No Yes bilateral lower extremity L97.312 Non-pressure chronic ulcer of right ankle with fat layer exposed 06/19/2022 No Yes L97.822 Non-pressure chronic ulcer of other  part of left lower leg with fat layer exposed3/21/2024 No Yes F17.218 Nicotine dependence, cigarettes, with other nicotine-induced disorders 06/19/2022 No Yes E11.622 Type 2 diabetes mellitus with other skin ulcer 06/19/2022 No Yes Inactive Problems Resolved Problems Electronic Signature(s) Signed: 07/17/2022 11:31:05 AM By: Allen Derry PA-C Entered By: Allen Derry on 07/17/2022 11:31:05 -------------------------------------------------------------------------------- Progress Note Details Patient Name: Date of Service: [name], [name] 07/17/2022 10:30 A M Medical Record Number: [id number] Patient Account Number: [id number] Date of Birth/Sex: Treating RN:  1960/08/06 (62 y.o. [name] Primary Care Provider: Yves Dill Other Clinician: Referring Provider: Treating Provider/Extender: Luan Moore, Neelam Weeks in Treatment: 4 Subjective Chief Complaint Information obtained from Patient Bilateral LE Ulcers [name], [name] ([id number]) [id number] Page 5 of 7 History of Present Illness (HPI) 06-19-2022 upon evaluation today patient appears to be doing somewhat poorly in regard to a wound that began around Christmas time on the patient's ankle and lower extremities bilaterally. He tells me that he has been having quite significant pain at this point unfortunately. He did go to the emergency department on March 2. However this has been going on since around Christmas. On March 2 he had x-rays that were negative and I did review that today as well. The patient also has what appears to be chronic venous stasis which is an ongoing issue for him here as well and I think that is part of the issue. Right now it is just hard for him to wear anything compression wise although long-term I think he is going to need something. He does smoke currently that something that he needs to stop. Patient does have a history of diabetes mellitus type 2 which is stated to be diet  controlled. He also again is a current smoker which I think is something that he needs to get rid of completely. 06-26-2022 upon evaluation today patient appears to be doing well currently in regard to his wounds all things considered he tells me the pain is a little bit better he has been on the correct antibiotic for only 2 days so he is seeing some improvement already this is already a good sign. Fortunately there does not appear to be any signs of infection locally nor systemically which is great news. No fevers, chills, nausea, vomiting, or diarrhea. 07-03-2022 upon evaluation today patient appears to be doing better in regard to his leg the infection is clearing very well and I am actually much more pleased today with where things stand compared to where they were. Fortunately I do not see any signs of active infection locally or systemically at this time which is great news. No fevers, chills, nausea, vomiting, or diarrhea. 07-10-2022 upon evaluation today patient appears to be doing better I do not see any signs of infection I think he is much better in that regard. Fortunately I do not see any evidence of active infection locally or systemically which is great news. 07-17-2022 upon evaluation today patient actually appears to be doing better in regard to his wounds. He is showing some signs of improvement we actually now able to wrap him and I do think he is now allow me to do some debridement today as well which will also be good news. Fortunately I do not see any evidence of active infection locally nor systemically which is great news. Objective Constitutional Well-nourished and well-hydrated in no acute distress. Vitals Time Taken: 10:56 AM, Height: 70 in, Weight: 180 lbs, BMI: 25.8, Temperature: 97.8 F, Pulse: 102 bpm, Respiratory Rate: 18 breaths/min, Blood Pressure: 132/84 mmHg. Respiratory normal breathing without difficulty. Psychiatric this patient is able to make decisions and  demonstrates good insight into disease process. Alert and Oriented x 3. pleasant and cooperative. General Notes: Upon inspection patient's wound bed actually did show signs of debridement needed and I did perform debridement to try to clearway as much of the necrotic debris as I could today. He tolerated that without complication and postdebridement the wound bed actually appears to be doing significantly  better which is great news. Integumentary (Hair, Skin) Wound #1 status is Open. Original cause of wound was Gradually Appeared. The date acquired was: 03/24/2022. The wound has been in treatment 4 weeks. The wound is located on the Left,Circumferential Lower Leg. The wound measures 7cm length x 13.1cm width x 0.1cm depth; 72.021cm^2 area and 7.202cm^3 volume. There is Fat Layer (Subcutaneous Tissue) exposed. There is no tunneling or undermining noted. There is a medium amount of serosanguineous drainage noted. There is small (1-33%) red, pink granulation within the wound bed. There is a large (67-100%) amount of necrotic tissue within the wound bed. Wound #2 status is Open. Original cause of wound was Gradually Appeared. The date acquired was: 03/24/2022. The wound has been in treatment 4 weeks. The wound is located on the Right,Lateral Ankle. The wound measures 3cm length x 0.7cm width x 0.2cm depth; 1.649cm^2 area and 0.33cm^3 volume. There is Fat Layer (Subcutaneous Tissue) exposed. There is no tunneling or undermining noted. There is a medium amount of serosanguineous drainage noted. There is medium (34-66%) red, pink granulation within the wound bed. There is a medium (34-66%) amount of necrotic tissue within the wound bed including Adherent Slough. Wound #3 status is Open. Original cause of wound was Gradually Appeared. The date acquired was: 03/24/2022. The wound has been in treatment 4 weeks. The wound is located on the Right,Medial Ankle. The wound measures 0cm length x 0cm width x 0cm  depth; 0cm^2 area and 0cm^3 volume. There is no tunneling or undermining noted. There is a none present amount of drainage noted. There is no granulation within the wound bed. There is no necrotic tissue within the wound bed. 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 There was a Excisional Skin/Subcutaneous Tissue Debridement with a total area of 28 sq cm performed by Allen Derry, PA-C. With the following instrument(s): Curette to remove Viable and Non-Viable tissue/material. Material removed includes Subcutaneous Tissue, Slough, and Biofilm after achieving pain control using Lidocaine 4% Topical Solution. No specimens were taken. A time out was conducted at 11:34, prior to the start of the procedure. A Minimum amount of bleeding was controlled with Pressure. The procedure was tolerated well. Post Debridement Measurements: 13.1cm length x 7cm width x 0.1cm depth; 7.202cm^3 volume. Character of Wound/Ulcer Post Debridement is stable. Severity of Tissue Post Debridement is: Fat layer exposed. Post procedure Diagnosis Wound #1: Same as Pre-Procedure Plan Follow-up Appointments: Return Appointment in 1 week. Bathing/ Shower/ Hygiene: May shower; gently cleanse wound with antibacterial soap, rinse and pat dry prior to dressing wounds No tub bath. Edema Control - Lymphedema / Segmental  Compressive Device / Other: 3 Layer Compression System for Lymphedema. - Urgo 2 layer wrap used size regular Elevate, Exercise Daily and Avoid Standing for Long Periods of Time. Elevate legs to the level of the heart and pump ankles as often as possible Elevate leg(s) parallel to the floor when sitting. DO YOUR BEST to sleep in the bed at night. DO NOT sleep in your recliner.  Long hours of sitting in a recliner leads to swelling of the legs and/or potential wounds on your backside. Medications-Please add to medication list.: P.O. Antibiotics - continue taking cipro as prescribed WOUND #1: - Lower Leg Wound Laterality: Left, Circumferential Cleanser: Byram Ancillary Kit - 15 Day Supply (Generic) 1 x Per Week/30 Days Discharge Instructions: Use supplies as instructed; Kit contains: (15) Saline Bullets; (15) 3x3 Gauze; 15 pr Gloves Cleanser: Soap and Water 1 x Per Week/30 Days Discharge Instructions: Gently cleanse wound with antibacterial soap, rinse and pat dry prior to dressing wounds Cleanser: Vashe 5.8 (oz) 1 x Per Week/30 Days Discharge Instructions: Use vashe 5.8 (oz) as directed Topical: Triamcinolone Acetonide Cream, 0.1%, 15 (g) tube 1 x Per Week/30 Days Discharge Instructions: Apply as directed by provider. TO LEG Prim Dressing: Silvercel 4 1/4x 4 1/4 (in/in) (Generic) 1 x Per Week/30 Days ary Discharge Instructions: Apply Silvercel 4 1/4x 4 1/4 (in/in) as instructed Secondary Dressing: Zetuvit Plus 4x4 (in/in) 1 x Per Week/30 Days Com pression Wrap: Urgo K2 Lite, two layer compression system, regular 1 x Per Week/30 Days WOUND #2: - Ankle Wound Laterality: Right, Lateral Cleanser: Byram Ancillary Kit - 15 Day Supply (Generic) 1 x Per Week/30 Days Discharge Instructions: Use supplies as instructed; Kit contains: (15) Saline Bullets; (15) 3x3 Gauze; 15 pr Gloves Cleanser: Soap and Water 1 x Per Week/30 Days Discharge Instructions: Gently cleanse wound with antibacterial soap, rinse  and pat dry prior to dressing wounds Cleanser: Vashe 5.8 (oz) 1 x Per Week/30 Days Discharge Instructions: Use vashe 5.8 (oz) as directed Topical: Triamcinolone Acetonide Cream, 0.1%, 15 (g) tube 1 x Per Week/30 Days Discharge Instructions: Apply as directed by provider. TO LEG Prim Dressing: Silvercel 4 1/4x 4 1/4 (in/in) (Generic) 1 x Per Week/30 Days ary Discharge Instructions: Apply Silvercel 4 1/4x 4 1/4 (in/in) as instructed Secondary Dressing: Zetuvit Plus 4x4 (in/in) 1 x Per Week/30 Days Com pression Wrap: Urgo K2 Lite, two layer compression system, regular 1 x Per Week/30 Days 1. I am good recommend that we have the patient continue to monitor for any signs of infection or worsening. I do believe that he is really doing quite well I think that he is still going to require ongoing treatment with regard to compression therapy as well as the silver cell. 2. I am also can recommend that he is probably going require ongoing serial debridements. I think this is something this can be necessary not just today but is an ongoing thing to keep this clean once we get a clean enough he may be able to see his toes at that point. We will see patient back for reevaluation in 1 week here in the clinic. If anything worsens or changes patient will contact our office for additional recommendations. Electronic Signature(s) Signed: 07/18/2022 1:28:24 PM By: Allen Derry PA-C Entered By: Allen Derry on 07/18/2022 13:28:24 -------------------------------------------------------------------------------- SuperBill Details Patient Name: Date of Service: [name], [name] 07/17/2022 Medical Record Number: [id number] Patient Account Number: [id number] Date of Birth/Sex: Treating RN: [birth year] (62 y.o. [name] Primary Care Provider: Yves Dill Other Clinician: STANFORD, [name] ([id number]) [id number] Page 7 of 7 Referring Provider: Treating Provider/Extender: Luan Moore,  Neelam Weeks in Treatment: 4 Diagnosis Coding ICD-10 Codes Code Description [phone] Chronic venous hypertension (idiopathic) with ulcer and inflammation of bilateral lower extremity L97.312 Non-pressure chronic ulcer of right ankle with fat layer exposed L97.822 Non-pressure chronic ulcer of other part of left lower leg with fat layer exposed  F17.218 Nicotine dependence, cigarettes, with other nicotine-induced disorders E11.622 Type 2 diabetes mellitus with other skin ulcer Facility Procedures : CPT4 Code: 16109604 Description: 11042 - DEB SUBQ TISSUE 20 SQ CM/< ICD-10 Diagnosis Description L97.312 Non-pressure chronic ulcer of right ankle with fat layer exposed L97.822 Non-pressure chronic ulcer of other part of left lower leg with fat layer expo Modifier: sed Quantity: 1 : CPT4 Code: [id number] Description: 11045 - DEB SUBQ TISS EA ADDL 20CM ICD-10 Diagnosis Description L97.312 Non-pressure chronic ulcer of right ankle with fat layer exposed L97.822 Non-pressure chronic ulcer of other part of left lower leg with fat layer expo Modifier: sed Quantity: 1 Physician Procedures : CPT4 Code Description Modifier [id number] 11042 - WC PHYS SUBQ TISS 20 SQ CM ICD-10 Diagnosis Description 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

## 2022-07-18 NOTE — Progress Notes (Signed)
[name], [name] ([id number]) [id number] Page 1 of 10 Visit Report for 07/17/2022 Arrival Information Details Patient Name: Date of Service: [name], [name] 07/17/2022 10:30 A M Medical Record Number: [id number] Patient Account Number: [id number] Date of Birth/Sex: Treating RN: 07/22/1960 (62 y.o. [name] Primary Care [name]: Yves Dill Other Clinician: Referring [name]: Treating Ethelmae Ringel/Extender: Luan Moore, Neelam Weeks in Treatment: 4 Visit Information History Since Last Visit Added or deleted any medications: No Patient Arrived: Ambulatory Any new allergies or adverse reactions: No Arrival Time: 10:55 Had a fall or experienced change in No Accompanied By: self activities of daily living that may affect Transfer Assistance: None risk of falls: Patient Identification Verified: Yes Hospitalized since last visit: No Secondary Verification Process Completed: Yes Has Dressing in Place as Prescribed: Yes Patient Requires Transmission-Based Precautions: No Has Compression in Place as Prescribed: Yes Patient Has Alerts: No Pain Present Now: No Electronic Signature(s) Signed: 07/17/2022 5:10:54 PM By: Angelina Pih Entered By: Angelina Pih on 07/17/2022 10:56:54 -------------------------------------------------------------------------------- Clinic Level of Care Assessment Details Patient Name: Date of Service: [name], [name] 07/17/2022 10:30 A M Medical Record Number: [id number] Patient Account Number: [id number] Date of Birth/Sex: Treating RN: 1960-07-21 (62 y.o. [name] Primary Care Kiri Hinderliter: Yves Dill Other Clinician: Referring Shaina Gullatt: Treating Alany Borman/Extender: Luan Moore, Neelam Weeks in Treatment: 4 Clinic Level of Care Assessment Items TOOL 1 Quantity Score []  - 0 Use when EandM and Procedure is performed on INITIAL visit ASSESSMENTS - Nursing Assessment / Reassessment []  - 0 General Physical Exam  (combine w/ comprehensive assessment (listed just below) when performed on new pt. evals) []  - 0 Comprehensive Assessment (HX, ROS, Risk Assessments, Wounds Hx, etc.) ASSESSMENTS - Wound and Skin Assessment / Reassessment []  - 0 Dermatologic / Skin Assessment (not related to wound area) ASSESSMENTS - Ostomy and/or Continence Assessment and Care []  - 0 Incontinence Assessment and Management []  - 0 Ostomy Care Assessment and Management (repouching, etc.) PROCESS - Coordination of Care []  - 0 Simple Patient / Family Education for ongoing care []  - 0 Complex (extensive) Patient / Family Education for ongoing care []  - 0 Staff obtains Chiropractor, Records, T Results / Process Orders est []  - 0 Staff telephones HHA, Nursing Homes / Clarify orders / etc []  - 0 Routine Transfer to another Facility (non-emergent condition) []  - 0 Routine Hospital Admission (non-emergent condition) []  - 0 New Admissions / Insurance Authorizations / Ordering NPWT Apligraf, etc. , []  - 0 Emergency Hospital Admission (emergent condition) PROCESS - Special Needs [name], [name] ([id number]) [id number] Page 2 of 10 []  - 0 Pediatric / Minor Patient Management []  - 0 Isolation Patient Management []  - 0 Hearing / Language / Visual special needs []  - 0 Assessment of Community assistance (transportation, D/C planning, etc.) []  - 0 Additional assistance / Altered mentation []  - 0 Support Surface(s) Assessment (bed, cushion, seat, etc.) INTERVENTIONS - Miscellaneous []  - 0 External ear exam []  - 0 Patient Transfer (multiple staff / Nurse, adult / Similar devices) []  - 0 Simple Staple / Suture removal (25 or less) []  - 0 Complex Staple / Suture removal (26 or more) []  - 0 Hypo/Hyperglycemic Management (do not check if billed separately) []  - 0 Ankle / Brachial Index (ABI) - do not check if billed separately Has the patient been seen at the hospital within the last three years:  Yes Total Score: 0 Level Of Care: ____ Electronic Signature(s) Signed: 07/17/2022 5:10:54 PM By: Angelina Pih Entered By: Angelina Pih  on 07/17/2022 13:25:59 -------------------------------------------------------------------------------- Encounter Discharge Information Details Patient Name: Date of Service: [name], [name] 07/17/2022 10:30 A M Medical Record Number: [id number] Patient Account Number: [id number] Date of Birth/Sex: Treating RN: 06/06/1960 (62 y.o. [name] Primary Care Symphany Fleissner: Yves Dill Other Clinician: Referring Cynitha Berte: Treating Ioan Landini/Extender: Luan Moore, Neelam Weeks in Treatment: 4 Encounter Discharge Information Items Post Procedure Vitals Discharge Condition: Stable Temperature (F): 97.8 Ambulatory Status: Ambulatory Pulse (bpm): 102 Discharge Destination: Home Respiratory Rate (breaths/min): 18 Transportation: Private Auto Blood Pressure (mmHg): 132/84 Accompanied By: self Schedule Follow-up Appointment: Yes Clinical Summary of Care: Electronic Signature(s) Signed: 07/17/2022 1:28:51 PM By: Angelina Pih Entered By: Angelina Pih on 07/17/2022 13:28:51 -------------------------------------------------------------------------------- Lower Extremity Assessment Details Patient Name: Date of Service: [name], [name] 07/17/2022 10:30 A M Medical Record Number: [id number] Patient Account Number: [id number] Date of Birth/Sex: Treating RN: 06-Feb-1961 (62 y.o. [name] Primary Care Talma Aguillard: Yves Dill Other Clinician: Referring Adaleigh Warf: Treating Satya Buttram/Extender: Luan Moore, Neelam Weeks in Treatment: 4 Edema Assessment Assessed: [Left: No] [Right: No] Edema: [Left: Yes] [Right: Yes] Calf Devora, Amen ([id number]) [id number] Page 3 of 10 Left: Right: Point of Measurement: 33 cm From Medial Instep 34 cm 34.7 cm Ankle Left: Right: Point of Measurement: 12 cm From Medial Instep 22 cm  23.6 cm Vascular Assessment Pulses: Dorsalis Pedis Palpable: [Left:Yes] [Right:Yes] Posterior Tibial Doppler Audible: [Left:Yes] [Right:Yes] Electronic Signature(s) Signed: 07/17/2022 5:10:54 PM By: Angelina Pih Entered By: Angelina Pih on 07/17/2022 11:18:01 -------------------------------------------------------------------------------- Multi Wound Chart Details Patient Name: Date of Service: [name], [name] 07/17/2022 10:30 A M Medical Record Number: [id number] Patient Account Number: [id number] Date of Birth/Sex: Treating RN: 06-04-1960 (62 y.o. [name] Primary Care Darric Plante: Yves Dill Other Clinician: Referring Justus Droke: Treating Parsa Rickett/Extender: Luan Moore, Neelam Weeks in Treatment: 4 Vital Signs Height(in): 70 Pulse(bpm): 102 Weight(lbs): 180 Blood Pressure(mmHg): 132/84 Body Mass Index(BMI): 25.8 Temperature(F): 97.8 Respiratory Rate(breaths/min): 18 [1:Photos:] Left, Circumferential Lower Leg Right, Lateral Ankle Right, Medial Ankle Wound Location: Gradually Appeared Gradually Appeared Gradually Appeared Wounding Event: Venous Leg Ulcer Venous Leg Ulcer Venous Leg Ulcer Primary Etiology: Rheumatoid Arthritis Rheumatoid Arthritis Rheumatoid Arthritis Comorbid History: 03/24/2022 03/24/2022 03/24/2022 Date Acquired: 4 4 4  Weeks of Treatment: Open Open Open Wound Status: No No No Wound Recurrence: 7x13.1x0.1 3x0.7x0.2 0x0x0 Measurements L x W x D (cm) 72.021 1.649 0 A (cm) : rea 7.202 0.33 0 Volume (cm) : 22.90% 54.60% 100.00% % Reduction in A rea: 22.90% 54.50% 100.00% % Reduction in Volume: Full Thickness Without Exposed Full Thickness Without Exposed Full Thickness Without Exposed Classification: Support Structures Support Structures Support Structures Medium Medium None Present Exudate Amount: Fridman, Marinus ([id number]) [id number] Page 4 of 10 Serosanguineous Serosanguineous N/A Exudate  Type: red, brown red, brown N/A Exudate Color: Small (1-33%) Medium (34-66%) None Present (0%) Granulation A mount: Red, Pink Red, Pink N/A Granulation Quality: Large (67-100%) Medium (34-66%) None Present (0%) Necrotic A mount: Fat Layer (Subcutaneous Tissue): Yes Fat Layer (Subcutaneous Tissue): Yes Fat Layer (Subcutaneous Tissue): No Exposed Structures: None None None Epithelialization: Debridement - Excisional N/A N/A Debridement: 11:34 N/A N/A Pre-procedure Verification/Time Out Taken: Lidocaine 4% Topical Solution N/A N/A Pain Control: Subcutaneous, Slough N/A N/A Tissue Debrided: Skin/Subcutaneous Tissue N/A N/A Level: 28 N/A N/A Debridement A (sq cm): rea Curette N/A N/A Instrument: Minimum N/A N/A Bleeding: Pressure N/A N/A Hemostasis A chieved: Procedure was tolerated well N/A N/A Debridement Treatment Response: 13.1x7x0.1 N/A N/A Post Debridement Measurements L x W x D (cm)  7.202 N/A N/A Post Debridement Volume: (cm) Debridement N/A N/A Procedures Performed: Treatment Notes Electronic Signature(s) Signed: 07/17/2022 1:25:17 PM By: Angelina Pih Entered By: Angelina Pih on 07/17/2022 13:25:17 -------------------------------------------------------------------------------- Multi-Disciplinary Care Plan Details Patient Name: Date of Service: [name], [name] 07/17/2022 10:30 A M Medical Record Number: [id number] Patient Account Number: [id number] Date of Birth/Sex: Treating RN: 24-Aug-1960 (62 y.o. [name] Primary Care Kenyen Candy: Yves Dill Other Clinician: Referring Liam Bossman: Treating Lilyanne Mcquown/Extender: Luan Moore, Neelam Weeks in Treatment: 4 Active Inactive Venous Leg Ulcer Nursing Diagnoses: Actual venous Insuffiency (use after diagnosis is confirmed) Goals: Patient will maintain optimal edema control Date Initiated: 07/10/2022 Target Resolution Date: 08/05/2022 Goal Status: Active Patient/caregiver will verbalize understanding  of disease process and disease management Date Initiated: 07/10/2022 Target Resolution Date: 08/05/2022 Goal Status: Active Verify adequate tissue perfusion prior to therapeutic compression application Date Initiated: 07/10/2022 Date Inactivated: 07/10/2022 Target Resolution Date: 07/10/2022 Goal Status: Met Interventions: Assess peripheral edema status every visit. Compression as ordered Provide education on venous insufficiency Treatment Activities: Therapeutic compression applied : 07/10/2022 Notes: Wound/Skin Impairment Nursing Diagnoses: Impaired tissue integrity Knowledge deficit related to ulceration/compromised skin integrity [name], [name] ([id number]) [id number] Page 5 of 10 Goals: Ulcer/skin breakdown will have a volume reduction of 30% by week 4 Date Initiated: 06/19/2022 Date Inactivated: 07/17/2022 Target Resolution Date: 07/17/2022 Goal Status: Met Ulcer/skin breakdown will have a volume reduction of 50% by week 8 Date Initiated: 06/19/2022 Target Resolution Date: 08/14/2022 Goal Status: Active Ulcer/skin breakdown will have a volume reduction of 80% by week 12 Date Initiated: 06/19/2022 Target Resolution Date: 09/11/2022 Goal Status: Active Ulcer/skin breakdown will heal within 14 weeks Date Initiated: 06/19/2022 Target Resolution Date: 09/25/2022 Goal Status: Active Interventions: Assess patient/caregiver ability to obtain necessary supplies Assess patient/caregiver ability to perform ulcer/skin care regimen upon admission and as needed Assess ulceration(s) every visit Provide education on ulcer and skin care Treatment Activities: Referred to DME [facility] [place] for dressing supplies : 06/19/2022 Skin care regimen initiated : 06/19/2022 Notes: Electronic Signature(s) Signed: 07/17/2022 1:26:34 PM By: Angelina Pih Entered By: Angelina Pih on 07/17/2022 13:26:33 -------------------------------------------------------------------------------- Pain  Assessment Details Patient Name: Date of Service: [name], [name] 07/17/2022 10:30 A M Medical Record Number: [id number] Patient Account Number: [id number] Date of Birth/Sex: Treating RN: 1961-01-16 (62 y.o. [name] Primary Care Abbagayle Zaragoza: Yves Dill Other Clinician: Referring Bernice Mcauliffe: Treating Elizabth Palka/Extender: Luan Moore, Neelam Weeks in Treatment: 4 Active Problems Location of Pain Severity and Description of Pain Patient Has Paino No Site Locations Rate the pain. Current Pain Level: 0 Pain Management and Medication Current Pain Management: Electronic Signature(s) Signed: 07/17/2022 5:10:54 PM By: Angelina Pih Entered By: Angelina Pih on 07/17/2022 10:58:37 [name], [name] ([id number]) [id number] Page 6 of 10 -------------------------------------------------------------------------------- Patient/Caregiver Education Details Patient Name: Date of Service: [name], [name] 4/18/2024andnbsp10:30 A M Medical Record Number: [id number] Patient Account Number: [id number] Date of Birth/Gender: Treating RN: Jul 22, 1960 (62 y.o. [name] Primary Care Physician: Yves Dill Other Clinician: Referring Physician: Treating Physician/Extender: Leslee Home in Treatment: 4 Education Assessment Education Provided To: Patient and Caregiver Education Topics Provided Wound Debridement: Handouts: Wound Debridement Methods: Explain/Verbal Responses: State content correctly Wound/Skin Impairment: Handouts: Caring for Your Ulcer Methods: Explain/Verbal Responses: State content correctly Electronic Signature(s) Signed: 07/17/2022 5:10:54 PM By: Angelina Pih Entered By: Angelina Pih on 07/17/2022 13:27:13 -------------------------------------------------------------------------------- Wound Assessment Details Patient Name: Date of Service: [name], [name] 07/17/2022 10:30 A M Medical Record Number: [id number] Patient  Account Number: [id number]  Date of Birth/Sex: Treating RN: 06/24/1960 (62 y.o. [name] Primary Care Meggin Ola: Yves Dill Other Clinician: Referring [name]: Treating Jacquelene Kopecky/Extender: Luan Moore, Neelam Weeks in Treatment: 4 Wound Status Wound Number: 1 Primary Etiology: Venous Leg Ulcer Wound Location: Left, Circumferential Lower Leg Wound Status: Open Wounding Event: Gradually Appeared Comorbid History: Rheumatoid Arthritis Date Acquired: 03/24/2022 Weeks Of Treatment: 4 Clustered Wound: No Photos Wound Measurements Length: (cm) 7 Width: (cm) 13.1 Depth: (cm) 0.1 Area: (cm) 72.02 [name], [name] ([id number]) Volume: (cm) 7. % Reduction in Area: 22.9% % Reduction in Volume: 22.9% Epithelialization: None 1 Tunneling: No [id number] Page 7 of 10 202 Undermining: No Wound Description Classification: Full Thickness Without Exposed Support Structures Exudate Amount: Medium Exudate Type: Serosanguineous Exudate Color: red, brown Foul Odor After Cleansing: No Slough/Fibrino Yes Wound Bed Granulation Amount: Small (1-33%) Exposed Structure Granulation Quality: Red, Pink Fat Layer (Subcutaneous Tissue) Exposed: Yes Necrotic Amount: Large (67-100%) Treatment Notes Wound #1 (Lower Leg) Wound Laterality: Left, Circumferential Cleanser Byram Ancillary Kit - 15 Day Supply Discharge Instruction: Use supplies as instructed; Kit contains: (15) Saline Bullets; (15) 3x3 Gauze; 15 pr Gloves Soap and Water Discharge Instruction: Gently cleanse wound with antibacterial soap, rinse and pat dry prior to dressing wounds Vashe 5.8 (oz) Discharge Instruction: Use vashe 5.8 (oz) as directed Peri-Wound Care Topical Triamcinolone Acetonide Cream, 0.1%, 15 (g) tube Discharge Instruction: Apply as directed by [name]. TO LEG Primary Dressing Silvercel 4 1/4x 4 1/4 (in/in) Discharge Instruction: Apply Silvercel 4 1/4x 4 1/4 (in/in) as  instructed Secondary Dressing Zetuvit Plus 4x4 (in/in) Secured With Compression Wrap [employer] K2 Lite, two layer compression system, regular Compression Stockings Add-Ons Electronic Signature(s) Signed: 07/17/2022 5:10:54 PM By: Angelina Pih Entered By: Angelina Pih on 07/17/2022 11:39:00 -------------------------------------------------------------------------------- Wound Assessment Details Patient Name: Date of Service: [name], [name] 07/17/2022 10:30 A M Medical Record Number: [id number] Patient Account Number: [id number] Date of Birth/Sex: Treating RN: 04/28/1960 (62 y.o. [name] Primary Care Michaelene Dutan: Yves Dill Other Clinician: Referring Cornie Mccomber: Treating Cayci Mcnabb/Extender: Luan Moore, Neelam Weeks in Treatment: 4 Wound Status Wound Number: 2 Primary Etiology: Venous Leg Ulcer Wound Location: Right, Lateral Ankle Wound Status: Open Wounding Event: Gradually Appeared Comorbid History: Rheumatoid Arthritis Date Acquired: 03/24/2022 Weeks Of Treatment: 4 Clustered Wound: No Photos [name], [name] ([id number]) [id number] Page 8 of 10 Wound Measurements Length: (cm) 3 Width: (cm) 0.7 Depth: (cm) 0.2 Area: (cm) 1.649 Volume: (cm) 0.33 % Reduction in Area: 54.6% % Reduction in Volume: 54.5% Epithelialization: None Tunneling: No Undermining: No Wound Description Classification: Full Thickness Without Exposed Suppor Exudate Amount: Medium Exudate Type: Serosanguineous Exudate Color: red, brown t Structures Foul Odor After Cleansing: No Slough/Fibrino Yes Wound Bed Granulation Amount: Medium (34-66%) Exposed Structure Granulation Quality: Red, Pink Fat Layer (Subcutaneous Tissue) Exposed: Yes Necrotic Amount: Medium (34-66%) Necrotic Quality: Adherent Slough Treatment Notes Wound #2 (Ankle) Wound Laterality: Right, Lateral Cleanser Byram Ancillary Kit - 15 Day Supply Discharge Instruction: Use supplies as instructed;  Kit contains: (15) Saline Bullets; (15) 3x3 Gauze; 15 pr Gloves Soap and Water Discharge Instruction: Gently cleanse wound with antibacterial soap, rinse and pat dry prior to dressing wounds Vashe 5.8 (oz) Discharge Instruction: Use vashe 5.8 (oz) as directed Peri-Wound Care Topical Triamcinolone Acetonide Cream, 0.1%, 15 (g) tube Discharge Instruction: Apply as directed by Lisandro Meggett. TO LEG Primary Dressing Silvercel 4 1/4x 4 1/4 (in/in) Discharge Instruction: Apply Silvercel 4 1/4x 4 1/4 (in/in) as instructed Secondary Dressing Zetuvit Plus 4x4 (in/in) Secured With Compression Wrap [employer]  K2 Lite, two layer compression system, regular Compression Stockings Add-Ons Electronic Signature(s) Signed: 07/17/2022 5:10:54 PM By: Angelina Pih Entered By: Angelina Pih on 07/17/2022 11:17:28 Wound Assessment Details -------------------------------------------------------------------------------- [name] ([id number]) [id number] Page 9 of 10 Patient Name: Date of Service: [name], [name] 07/17/2022 10:30 A M Medical Record Number: [id number] Patient Account Number: [id number] Date of Birth/Sex: Treating RN: 15-Aug-1960 (62 y.o. [name] Primary Care Donovyn Guidice: Yves Dill Other Clinician: Referring Shereen Marton: Treating [name]/Extender: Luan Moore, Neelam Weeks in Treatment: 4 Wound Status Wound Number: 3 Primary Etiology: Venous Leg Ulcer Wound Location: Right, Medial Ankle Wound Status: Open Wounding Event: Gradually Appeared Comorbid History: Rheumatoid Arthritis Date Acquired: 03/24/2022 Weeks Of Treatment: 4 Clustered Wound: No Photos Wound Measurements Length: (cm) 0 % Reduction in Area: 100% Width: (cm) 0 % Reduction in Volume: 100% Depth: (cm) 0 Epithelialization: None Area: (cm) 0 Tunneling: No Volume: (cm) 0 Undermining: No Wound Description Classification: Full Thickness Without Exposed Support Structures Foul Odor  After Cleansing: No Exudate Amount: None Present Slough/Fibrino No Wound Bed Granulation Amount: None Present (0%) Exposed Structure Necrotic Amount: None Present (0%) Fat Layer (Subcutaneous Tissue) Exposed: No Electronic Signature(s) Signed: 07/17/2022 5:10:54 PM By: Angelina Pih Entered By: Angelina Pih on 07/17/2022 11:34:46 -------------------------------------------------------------------------------- Vitals Details Patient Name: Date of Service: [name], [name] 07/17/2022 10:30 A M Medical Record Number: [id number] Patient Account Number: [id number] Date of Birth/Sex: Treating RN: Aug 04, 1960 (62 y.o. [name] Primary Care Buffie Herne: Yves Dill Other Clinician: Referring Jeziah Kretschmer: Treating Laquiesha Piacente/Extender: Luan Moore, Neelam Weeks in Treatment: 4 Vital Signs Time Taken: 10:56 Temperature (F): 97.8 Height (in): 70 Pulse (bpm): 102 Weight (lbs): 180 Respiratory Rate (breaths/min): 18 Body Mass Index (BMI): 25.8 Blood Pressure (mmHg): 132/84 Reference Range: 80 - 120 mg / dl Electronic Signature(s) Signed: 07/17/2022 5:10:54 PM By: Deboraha Sprang ([id number]) [id number] Page 10 of 10 Entered By: Angelina Pih on 07/17/2022 10:58:31

## 2022-07-21 ENCOUNTER — Encounter: Payer: Self-pay | Admitting: Internal Medicine

## 2022-07-22 NOTE — Progress Notes (Signed)
[name], [name] ([id number]) [id number] Page 1 of 2 Visit Report for 07/14/2022 Physician Orders Details Patient Name: Date of Service: [name], [name] 07/14/2022 11:30 A M Medical Record Number: [id number] Patient Account Number: [id number] Date of Birth/Sex: Treating RN: 01/17/1961 (62 y.o. [name] Primary Care Provider: Yves Dill Other Clinician: Referring Provider: Treating Provider/Extender: Luan Moore, Neelam Weeks in Treatment: 3 Verbal / Phone Orders: No Diagnosis Coding Follow-up Appointments Return Appointment in 1 week. Bathing/ Shower/ Hygiene May shower; gently cleanse wound with antibacterial soap, rinse and pat dry prior to dressing wounds No tub bath. Edema Control - Lymphedema / Segmental Compressive Device / Other Bilateral Lower Extremities 3 Layer Compression System for Lymphedema. - Urgo 2 layer wrap used size small Elevate, Exercise Daily and A void Standing for Long Periods of Time. Elevate legs to the level of the heart and pump ankles as often as possible Elevate leg(s) parallel to the floor when sitting. DO YOUR BEST to sleep in the bed at night. DO NOT sleep in your recliner. Long hours of sitting in a recliner leads to swelling of the legs and/or potential wounds on your backside. Medications-Please add to medication list. ntibiotics - continue taking cipro as prescribed P.O. A Wound Treatment Wound #1 - Lower Leg Wound Laterality: Left, Circumferential Cleanser: Byram Ancillary Kit - 15 Day Supply (Generic) 1 x Per Week/30 Days Discharge Instructions: Use supplies as instructed; Kit contains: (15) Saline Bullets; (15) 3x3 Gauze; 15 pr Gloves Cleanser: Soap and Water 1 x Per Week/30 Days Discharge Instructions: Gently cleanse wound with antibacterial soap, rinse and pat dry prior to dressing wounds Cleanser: Vashe 5.8 (oz) 1 x Per Week/30 Days Discharge Instructions: Use vashe 5.8 (oz) as directed Topical: Triamcinolone  Acetonide Cream, 0.1%, 15 (g) tube 1 x Per Week/30 Days Discharge Instructions: Apply as directed by provider. TO LEG Prim Dressing: Silvercel 4 1/4x 4 1/4 (in/in) (Generic) 1 x Per Week/30 Days ary Discharge Instructions: Apply Silvercel 4 1/4x 4 1/4 (in/in) as instructed Secondary Dressing: Zetuvit Plus 4x4 (in/in) 1 x Per Week/30 Days Compression Wrap: Urgo K2 Lite, two layer compression system, regular 1 x Per Week/30 Days Wound #2 - Ankle Wound Laterality: Right, Lateral Cleanser: Byram Ancillary Kit - 15 Day Supply (Generic) 1 x Per Week/30 Days Discharge Instructions: Use supplies as instructed; Kit contains: (15) Saline Bullets; (15) 3x3 Gauze; 15 pr Gloves Cleanser: Soap and Water 1 x Per Week/30 Days Discharge Instructions: Gently cleanse wound with antibacterial soap, rinse and pat dry prior to dressing wounds Cleanser: Vashe 5.8 (oz) 1 x Per Week/30 Days Discharge Instructions: Use vashe 5.8 (oz) as directed Topical: Triamcinolone Acetonide Cream, 0.1%, 15 (g) tube 1 x Per Week/30 Days Discharge Instructions: Apply as directed by provider. TO LEG Prim Dressing: Silvercel 4 1/4x 4 1/4 (in/in) (Generic) 1 x Per Week/30 Days ary Discharge Instructions: Apply Silvercel 4 1/4x 4 1/4 (in/in) as instructed Secondary Dressing: Zetuvit Plus 4x4 (in/in) 1 x Per Week/30 Days Compression Wrap: Urgo K2 Lite, two layer compression system, regular 1 x Per Week/30 Days Chason, Tsutomu ([id number]) [id number] Page 2 of 2 Wound #3 - Ankle Wound Laterality: Right, Medial Cleanser: Byram Ancillary Kit - 15 Day Supply (Generic) 1 x Per Week/30 Days Discharge Instructions: Use supplies as instructed; Kit contains: (15) Saline Bullets; (15) 3x3 Gauze; 15 pr Gloves Cleanser: Soap and Water 1 x Per Week/30 Days Discharge Instructions: Gently cleanse wound with antibacterial soap, rinse and pat dry prior to dressing wounds Cleanser:  Vashe 5.8 (oz) 1 x Per Week/30 Days Discharge  Instructions: Use vashe 5.8 (oz) as directed Topical: Triamcinolone Acetonide Cream, 0.1%, 15 (g) tube 1 x Per Week/30 Days Discharge Instructions: Apply as directed by provider. TO LEG Prim Dressing: Silvercel 4 1/4x 4 1/4 (in/in) (Generic) 1 x Per Week/30 Days ary Discharge Instructions: Apply Silvercel 4 1/4x 4 1/4 (in/in) as instructed Secondary Dressing: Zetuvit Plus 4x4 (in/in) 1 x Per Week/30 Days Compression Wrap: Urgo K2 Lite, two layer compression system, regular 1 x Per Week/30 Days Electronic Signature(s) Signed: 07/15/2022 8:44:52 AM By: Allen Derry PA-C Signed: 07/21/2022 5:00:35 PM By: Betha Loa Entered By: Betha Loa on 07/14/2022 13:20:33 -------------------------------------------------------------------------------- SuperBill Details Patient Name: Date of Service: [name], [name] 07/14/2022 Medical Record Number: [id number] Patient Account Number: [id number] Date of Birth/Sex: Treating RN: 1960/04/02 (62 y.o. 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 Modifier: Quantity: 1 Electronic Signature(s) Signed: 07/15/2022 8:44:52 AM By: Allen Derry PA-C Signed: 07/21/2022 5:00:35 PM By: Betha Loa Entered By: Betha Loa on  07/14/2022 13:21:26

## 2022-07-22 NOTE — Progress Notes (Signed)
Smyers, [name] ([id number]) [id number] Page 1 of 5 Visit Report for 07/14/2022 Arrival Information Details Patient Name: Date of Service: [name], [name] 07/14/2022 11:30 A M Medical Record Number: [id number] Patient Account Number: [id number] Date of Birth/Sex: Treating RN: [birth year] (62 y.o. [name] Primary Care Chez Bulnes: Yves Dill Other Clinician: Referring Antwion Carpenter: Treating Lin Hackmann/Extender: Luan Moore, Neelam Weeks in Treatment: 3 Visit Information History Since Last Visit All ordered tests and consults were completed: No Patient Arrived: Ambulatory Added or deleted any medications: No Arrival Time: 11:49 Any new allergies or adverse reactions: No Transfer Assistance: None Had a fall or experienced change in No Patient Identification Verified: Yes activities of daily living that may affect Secondary Verification Process Completed: Yes risk of falls: Patient Requires Transmission-Based Precautions: No Signs or symptoms of abuse/neglect since last visito No Patient Has Alerts: No Hospitalized since last visit: No Implantable device outside of the clinic excluding No cellular tissue based products placed in the center since last visit: Has Dressing in Place as Prescribed: Yes Has Compression in Place as Prescribed: Yes Pain Present Now: No Electronic Signature(s) Signed: 07/21/2022 5:00:35 PM By: Betha Loa Entered By: Betha Loa on 07/14/2022 11:50:14 -------------------------------------------------------------------------------- Clinic Level of Care Assessment Details Patient Name: Date of Service: [name], [name] 07/14/2022 11:30 A M Medical Record Number: [id number] Patient Account Number: [id number] Date of Birth/Sex: Treating RN: 05/19/1960 (62 y.o. [name] Primary Care Diem Pagnotta: Yves Dill Other Clinician: Referring Sondra Blixt: Treating Cowan Pilar/Extender: Luan Moore, Neelam Weeks in Treatment: 3 Clinic Level of  Care Assessment Items TOOL 1 Quantity Score  - 0 Use when EandM and Procedure is performed on INITIAL visit ASSESSMENTS - Nursing Assessment / Reassessment  - 0 General Physical Exam (combine w/ comprehensive assessment (listed just below) when performed on new pt. evals)  - 0 Comprehensive Assessment (HX, ROS, Risk Assessments, Wounds Hx, etc.) ASSESSMENTS - Wound and Skin Assessment / Reassessment  - 0 Dermatologic / Skin Assessment (not related to wound area) ASSESSMENTS - Ostomy and/or Continence Assessment and Care  - 0 Incontinence Assessment and Management  - 0 Ostomy Care Assessment and Management (repouching, etc.) PROCESS - Coordination of Care  - 0 Simple Patient / Family Education for ongoing care  - 0 Complex (extensive) Patient / Family Education for ongoing care  - 0 Staff obtains Chiropractor, Records, T Results / Process Orders est  - 0 Staff telephones HHA, Nursing Homes / Clarify orders / etc  - 0 Routine Transfer to another Facility (non-emergent condition)  - 0 Routine Hospital Admission (non-emergent condition) Outten, Jeffrie ([id number]) [id number] Page 2 of 5  - 0 New Admissions / Manufacturing engineer / Ordering NPWT Apligraf, etc. ,  - 0 Emergency Hospital Admission (emergent condition) PROCESS - Special Needs  - 0 Pediatric / Minor Patient Management  - 0 Isolation Patient Management  - 0 Hearing / Language / Visual special needs  - 0 Assessment of Community assistance (transportation, D/C planning, etc.)  - 0 Additional assistance / Altered mentation  - 0 Support Surface(s) Assessment (bed, cushion, seat, etc.) INTERVENTIONS - Miscellaneous  - 0 External ear exam  - 0 Patient Transfer (multiple staff / Nurse, adult / Similar devices)  - 0 Simple Staple / Suture removal (25 or less)  - 0 Complex Staple / Suture removal (26 or more)  - 0 Hypo/Hyperglycemic  Management (do not check if billed separately)  - 0 Ankle / Brachial Index (ABI) - do not check if billed separately Has  the patient been seen at the hospital within the last three years: Yes Total Score: 0 Level Of Care: ____ Electronic Signature(s) Signed: 07/21/2022 5:00:35 PM By: Betha Loa Entered By: Betha Loa on 07/14/2022 13:21:14 -------------------------------------------------------------------------------- Compression Therapy Details Patient Name: Date of Service: [name], [name] 07/14/2022 11:30 A M Medical Record Number: [id number] Patient Account Number: [id number] Date of Birth/Sex: Treating RN: 08-01-1960 (62 y.o. [name] Primary Care Amilya Haver: Yves Dill Other Clinician: Referring Vicke Plotner: Treating Delmus Warwick/Extender: Luan Moore, Neelam Weeks in Treatment: 3 Compression Therapy Performed for Wound Assessment: Wound #1 Left,Circumferential Lower Leg Performed By: Farrel Gordon, Angie, Compression Type: Double Layer Pre Treatment ABI: 1.4 Electronic Signature(s) Signed: 07/21/2022 5:00:35 PM By: Betha Loa Entered By: Betha Loa on 07/14/2022 13:19:48 -------------------------------------------------------------------------------- Compression Therapy Details Patient Name: Date of Service: [name], [name] 07/14/2022 11:30 A M Medical Record Number: [id number] Patient Account Number: [id number] Date of Birth/Sex: Treating RN: 05/27/1960 (62 y.o. [name] Primary Care Gabriell Daigneault: Yves Dill Other Clinician: Referring Chelcey Caputo: Treating Demetreus Lothamer/Extender: Luan Moore, Neelam Weeks in Treatment: 3 Compression Therapy Performed for Wound Assessment: Wound #2 Right,Lateral Ankle Performed By: Farrel Gordon, Angie, Compression Type: Double Layer Pre Treatment ABI: 1.3 Electronic Signature(s) Signed: 07/21/2022 5:00:35 PM By: Corwin Levins ([id number]) [id number] Page 3 of 5 Entered By:  Betha Loa on 07/14/2022 13:20:10 -------------------------------------------------------------------------------- Encounter Discharge Information Details Patient Name: Date of Service: [name], [name] 07/14/2022 11:30 A M Medical Record Number: [id number] Patient Account Number: [id number] Date of Birth/Sex: Treating RN: 1960-12-16 (62 y.o. [name], [name] Primary Care Adeli Frost: Yves Dill Other Clinician: Referring Mikaelyn Arthurs: Treating Maryhelen Lindler/Extender: Luan Moore, Neelam Weeks in Treatment: 3 Encounter Discharge Information Items Discharge Condition: Stable Ambulatory Status: Ambulatory Discharge Destination: Home Transportation: Private Auto Accompanied By: self Schedule Follow-up Appointment: Yes Clinical Summary of Care: Electronic Signature(s) Signed: 07/21/2022 5:00:35 PM By: Betha Loa Entered By: Betha Loa on 07/14/2022 13:20:51 -------------------------------------------------------------------------------- Wound Assessment Details Patient Name: Date of Service: [name], [name] 07/14/2022 11:30 A M Medical Record Number: [id number] Patient Account Number: [id number] Date of Birth/Sex: Treating RN: 18-Nov-1960 (62 y.o. [name], [name] Primary Care Kaysi Ourada: Yves Dill Other Clinician: Referring Rudolpho Claxton: Treating Elise Gladden/Extender: Luan Moore, Neelam Weeks in Treatment: 3 Wound Status Wound Number: 1 Primary Etiology: Venous Leg Ulcer Wound Location: Left, Circumferential Lower Leg Wound Status: Open Wounding Event: Gradually Appeared Comorbid History: Rheumatoid Arthritis Date Acquired: 03/24/2022 Weeks Of Treatment: 3 Clustered Wound: No Wound Measurements Length: (cm) 6 Width: (cm) 14.5 Depth: (cm) 0.1 Area: (cm) 68.33 Volume: (cm) 6.833 % Reduction in Area: 26.9% % Reduction in Volume: 26.9% Epithelialization: None Wound Description Classification: Full Thickness Without Exposed Suppor Exudate Amount: Medium Exudate Type:  Serosanguineous Exudate Color: red, brown t Structures Foul Odor After Cleansing: No Slough/Fibrino Yes Wound Bed Granulation Amount: Small (1-33%) Exposed Structure Granulation Quality: Red, Pink Fat Layer (Subcutaneous Tissue) Exposed: Yes Necrotic Amount: Large (67-100%) Necrotic Quality: Adherent Scientist, physiological) Signed: 07/14/2022 4:58:08 PM By: Elliot Gurney, BSN, RN, CWS, Kim RN, BSN Signed: 07/21/2022 5:00:35 PM By: Betha Loa Entered By: Betha Loa on 07/14/2022 11:50:43 Gabrielson, Trig ([id number]) [id number] Page 4 of 5 -------------------------------------------------------------------------------- Wound Assessment Details Patient Name: Date of Service: [name], [name] 07/14/2022 11:30 A M Medical Record Number: [id number] Patient Account Number: [id number] Date of Birth/Sex: Treating RN: 04-08-1960 (62 y.o. [name] Primary Care Jamicia Haaland: Yves Dill Other Clinician: Referring Shresta Risden: Treating Cristina Ceniceros/Extender: Luan Moore, Neelam Weeks in Treatment: 3 Wound  Status Wound Number: 2 Primary Etiology: Venous Leg Ulcer Wound Location: Right, Lateral Ankle Wound Status: Open Wounding Event: Gradually Appeared Comorbid History: Rheumatoid Arthritis Date Acquired: 03/24/2022 Weeks Of Treatment: 3 Clustered Wound: No Wound Measurements Length: (cm) 3.2 Width: (cm) 0.9 Depth: (cm) 0.2 Area: (cm) 2.262 Volume: (cm) 0.452 % Reduction in Area: 37.7% % Reduction in Volume: 37.7% Epithelialization: None Wound Description Classification: Full Thickness Without Exposed Suppor Exudate Amount: Medium Exudate Type: Serosanguineous Exudate Color: red, brown t Structures Foul Odor After Cleansing: No Slough/Fibrino Yes Wound Bed Granulation Amount: Medium (34-66%) Exposed Structure Granulation Quality: Red, Pink Fat Layer (Subcutaneous Tissue) Exposed: Yes Necrotic Amount: Medium (34-66%) Necrotic Quality: Adherent  Scientist, physiological) Signed: 07/14/2022 4:58:08 PM By: Elliot Gurney, BSN, RN, CWS, Kim RN, BSN Signed: 07/21/2022 5:00:35 PM By: Betha Loa Entered By: Betha Loa on 07/14/2022 11:50:47 -------------------------------------------------------------------------------- Wound Assessment Details Patient Name: Date of Service: [name], [name] 07/14/2022 11:30 A M Medical Record Number: [id number] Patient Account Number: [id number] Date of Birth/Sex: Treating RN: Mar 18, 1961 (62 y.o. [name], [name] Primary Care Marshella Tello: Yves Dill Other Clinician: Referring Helaina Stefano: Treating Saba Neuman/Extender: Luan Moore, Neelam Weeks in Treatment: 3 Wound Status Wound Number: 3 Primary Etiology: Venous Leg Ulcer Wound Location: Right, Medial Ankle Wound Status: Open Wounding Event: Gradually Appeared Comorbid History: Rheumatoid Arthritis Date Acquired: 03/24/2022 Weeks Of Treatment: 3 Clustered Wound: No Wound Measurements Length: (cm) 0.5 Width: (cm) 0.5 Depth: (cm) 0.1 Area: (cm) 0.196 Volume: (cm) 0.02 % Reduction in Area: 0% % Reduction in Volume: 0% Epithelialization: None Wound Description Classification: Full Thickness Without Exposed Suppor Terwilliger, Shirley ([id number]) Exudate Amount: Medium Exudate Type: Serosanguineous Exudate Color: red, brown t Structures Foul Odor After Cleansing: No [id number] Page 5 of 5 Slough/Fibrino Yes Wound Bed Granulation Amount: None Present (0%) Exposed Structure Necrotic Amount: Large (67-100%) Fat Layer (Subcutaneous Tissue) Exposed: No Necrotic Quality: Adherent Scientist, physiological) Signed: 07/14/2022 4:58:08 PM By: Elliot Gurney, BSN, RN, CWS, Kim RN, BSN Signed: 07/21/2022 5:00:35 PM By: Betha Loa Entered By: Betha Loa on 07/14/2022 11:50:52

## 2022-07-24 ENCOUNTER — Encounter: Payer: 59 | Admitting: Physician Assistant

## 2022-07-24 DIAGNOSIS — E11622 Type 2 diabetes mellitus with other skin ulcer: Secondary | ICD-10-CM | POA: Diagnosis not present

## 2022-07-25 NOTE — Progress Notes (Addendum)
[name], [name] ([id number]) [id number] Page 1 of 6 Visit Report for 07/24/2022 Chief Complaint Document Details Patient Name: Date of Service: [name], [name] 07/24/2022 2:00 PM Medical Record Number: [id number] Patient Account Number: [id number] Date of Birth/Sex: Treating RN: 1960/11/17 (62 y.o. [name] Primary Care Provider: Yves Dill Other Clinician: Referring Provider: Treating Provider/Extender: Luan Moore, Neelam Weeks in Treatment: 5 Information Obtained from: Patient Chief Complaint Bilateral LE Ulcers Electronic Signature(s) Signed: 07/24/2022 2:10:46 PM By: Allen Derry PA-C Entered By: Allen Derry on 07/24/2022 14:10:46 -------------------------------------------------------------------------------- HPI Details Patient Name: Date of Service: [name], [name] 07/24/2022 2:00 PM Medical Record Number: [id number] Patient Account Number: [id number] Date of Birth/Sex: Treating RN: [birth year] (62 y.o. [name] Primary Care Provider: Yves Dill Other Clinician: Referring Provider: Treating Provider/Extender: Luan Moore, Neelam Weeks in Treatment: 5 History of Present Illness HPI Description: 06-19-2022 upon evaluation today patient appears to be doing somewhat poorly in regard to a wound that began around Christmas time on the patient's ankle and lower extremities bilaterally. He tells me that he has been having quite significant pain at this point unfortunately. He did go to the emergency department on March 2. However this has been going on since around Christmas. On March 2 he had x-rays that were negative and I did review that today as well. The patient also has what appears to be chronic venous stasis which is an ongoing issue for him here as well and I think that is part of the issue. Right now it is just hard for him to wear anything compression wise although long-term I think he is going to need something. He does smoke  currently that something that he needs to stop. Patient does have a history of diabetes mellitus type 2 which is stated to be diet controlled. He also again is a current smoker which I think is something that he needs to get rid of completely. 06-26-2022 upon evaluation today patient appears to be doing well currently in regard to his wounds all things considered he tells me the pain is a little bit better he has been on the correct antibiotic for only 2 days so he is seeing some improvement already this is already a good sign. Fortunately there does not appear to be any signs of infection locally nor systemically which is great news. No fevers, chills, nausea, vomiting, or diarrhea. 07-03-2022 upon evaluation today patient appears to be doing better in regard to his leg the infection is clearing very well and I am actually much more pleased today with where things stand compared to where they were. Fortunately I do not see any signs of active infection locally or systemically at this time which is great news. No fevers, chills, nausea, vomiting, or diarrhea. 07-10-2022 upon evaluation today patient appears to be doing better I do not see any signs of infection I think he is much better in that regard. Fortunately I do not see any evidence of active infection locally or systemically which is great news. 07-17-2022 upon evaluation today patient actually appears to be doing better in regard to his wounds. He is showing some signs of improvement we actually now able to wrap him and I do think he is now allow me to do some debridement today as well which will also be good news. Fortunately I do not see any evidence of active infection locally nor systemically which is great news. 07-24-2022 upon evaluation patient actually appears to be making good progress  here and I am very pleased with where we stand. I do think that he is showing signs of improvement I think that the medication is doing a good job as far  as the new epithelial growth is concerned. He is in agreement with continuing with the current regimen the compression wrap has been beneficial the alginate did well but I do think we probably need to change this more than once a week. Electronic Signature(s) Signed: 07/24/2022 5:08:28 PM By: Allen Derry PA-C Entered By: Allen Derry on 07/24/2022 17:08:28 Physical Exam Details -------------------------------------------------------------------------------- [name] ([id number]) [id number] Page 2 of 6 Patient Name: Date of Service: [name], [name] 07/24/2022 2:00 PM Medical Record Number: [id number] Patient Account Number: [id number] Date of Birth/Sex: Treating RN: [birth year] (62 y.o. [name] Primary Care Provider: Yves Dill Other Clinician: Referring Provider: Treating Provider/Extender: Luan Moore, Neelam Weeks in Treatment: 5 Constitutional Well-nourished and well-hydrated in no acute distress. Respiratory normal breathing without difficulty. Psychiatric this patient is able to make decisions and demonstrates good insight into disease process. Alert and Oriented x 3. pleasant and cooperative. Notes Upon inspection patient's wound bed actually showed signs of good granulation and epithelization at this point. With that being said I do believe that we need to try to change this more than just once a week I would recommend a twice a week dressing change with a nurse visit in between. Electronic Signature(s) Signed: 07/24/2022 5:08:47 PM By: Allen Derry PA-C Entered By: Allen Derry on 07/24/2022 17:08:46 -------------------------------------------------------------------------------- Physician Orders Details Patient Name: Date of Service: [name], [name] 07/24/2022 2:00 PM Medical Record Number: [id number] Patient Account Number: [id number] Date of Birth/Sex: Treating RN: 03/19/1961 (62 y.o. [name] Primary Care Provider: Yves Dill  Other Clinician: Referring Provider: Treating Provider/Extender: Luan Moore, Neelam Weeks in Treatment: 5 Verbal / Phone Orders: No Diagnosis Coding ICD-10 Coding Code Description [phone] Chronic venous hypertension (idiopathic) with ulcer and inflammation of bilateral lower extremity L97.312 Non-pressure chronic ulcer of right ankle with fat layer exposed L97.822 Non-pressure chronic ulcer of other part of left lower leg with fat layer exposed F17.218 Nicotine dependence, cigarettes, with other nicotine-induced disorders E11.622 Type 2 diabetes mellitus with other skin ulcer Follow-up Appointments Return Appointment in 1 week. Bathing/ Shower/ Hygiene May shower; gently cleanse wound with antibacterial soap, rinse and pat dry prior to dressing wounds No tub bath. Edema Control - Lymphedema / Segmental Compressive Device / Other Bilateral Lower Extremities 3 Layer Compression System for Lymphedema. - Urgo 2 layer wrap used size regular Elevate, Exercise Daily and A void Standing for Long Periods of Time. Elevate legs to the level of the heart and pump ankles as often as possible Elevate leg(s) parallel to the floor when sitting. DO YOUR BEST to sleep in the bed at night. DO NOT sleep in your recliner. Long hours of sitting in a recliner leads to swelling of the legs and/or potential wounds on your backside. Medications-Please add to medication list. ntibiotics - continue taking cipro as prescribed P.O. A Wound Treatment Wound #1 - Lower Leg Wound Laterality: Left, Circumferential Cleanser: Byram Ancillary Kit - 15 Day Supply (Generic) 1 x Per Week/30 Days Discharge Instructions: Use supplies as instructed; Kit contains: (15) Saline Bullets; (15) 3x3 Gauze; 15 pr Gloves Cleanser: Soap and Water 1 x Per Week/30 Days Discharge Instructions: Gently cleanse wound with antibacterial soap, rinse and pat dry prior to dressing wounds [name], [name] ([id number])  [id number] Page 3 of 6 Cleanser: Vashe  5.8 (oz) 1 x Per Week/30 Days Discharge Instructions: Use vashe 5.8 (oz) as directed Topical: Triamcinolone Acetonide Cream, 0.1%, 15 (g) tube 1 x Per Week/30 Days Discharge Instructions: Apply as directed by provider. TO LEG Prim Dressing: Silvercel 4 1/4x 4 1/4 (in/in) (Generic) 1 x Per Week/30 Days ary Discharge Instructions: Apply Silvercel 4 1/4x 4 1/4 (in/in) as instructed Secondary Dressing: Zetuvit Plus 4x4 (in/in) 1 x Per Week/30 Days Compression Wrap: Urgo K2 Lite, two layer compression system, regular 1 x Per Week/30 Days Wound #2 - Ankle Wound Laterality: Right, Lateral Cleanser: Byram Ancillary Kit - 15 Day Supply (Generic) 1 x Per Week/30 Days Discharge Instructions: Use supplies as instructed; Kit contains: (15) Saline Bullets; (15) 3x3 Gauze; 15 pr Gloves Cleanser: Soap and Water 1 x Per Week/30 Days Discharge Instructions: Gently cleanse wound with antibacterial soap, rinse and pat dry prior to dressing wounds Cleanser: Vashe 5.8 (oz) 1 x Per Week/30 Days Discharge Instructions: Use vashe 5.8 (oz) as directed Topical: Triamcinolone Acetonide Cream, 0.1%, 15 (g) tube 1 x Per Week/30 Days Discharge Instructions: Apply as directed by provider. TO LEG Prim Dressing: Silvercel 4 1/4x 4 1/4 (in/in) (Generic) 1 x Per Week/30 Days ary Discharge Instructions: Apply Silvercel 4 1/4x 4 1/4 (in/in) as instructed Secondary Dressing: Zetuvit Plus 4x4 (in/in) 1 x Per Week/30 Days Compression Wrap: Urgo K2 Lite, two layer compression system, regular 1 x Per Week/30 Days Electronic Signature(s) Signed: 07/24/2022 4:36:23 PM By: Angelina Pih Signed: 07/24/2022 5:47:58 PM By: Allen Derry PA-C Entered By: Angelina Pih on 07/24/2022 16:01:21 -------------------------------------------------------------------------------- Problem List Details Patient Name: Date of Service: [name], [name] 07/24/2022 2:00 PM Medical Record  Number: [id number] Patient Account Number: [id number] Date of Birth/Sex: Treating RN: 1960-06-12 (62 y.o. [name] Primary Care Provider: Yves Dill Other Clinician: Referring Provider: Treating Provider/Extender: Luan Moore, Neelam Weeks in Treatment: 5 Active Problems ICD-10 Encounter Code Description Active Date MDM Diagnosis I87.333 Chronic venous hypertension (idiopathic) with ulcer and inflammation of 06/19/2022 No Yes bilateral lower extremity L97.312 Non-pressure chronic ulcer of right ankle with fat layer exposed 06/19/2022 No Yes L97.822 Non-pressure chronic ulcer of other part of left lower leg with fat layer exposed3/21/2024 No Yes F17.218 Nicotine dependence, cigarettes, with other nicotine-induced disorders 06/19/2022 No Yes E11.622 Type 2 diabetes mellitus with other skin ulcer 06/19/2022 No Yes [name], [name] ([id number]) [id number] Page 4 of 6 Inactive Problems Resolved Problems Electronic Signature(s) Signed: 07/24/2022 2:10:43 PM By: Allen Derry PA-C Entered By: Allen Derry on 07/24/2022 14:10:43 -------------------------------------------------------------------------------- Progress Note Details Patient Name: Date of Service: [name], [name] 07/24/2022 2:00 PM Medical Record Number: [id number] Patient Account Number: [id number] Date of Birth/Sex: Treating RN: [birth year] (62 y.o. [name] Primary Care Provider: Yves Dill Other Clinician: Referring Provider: Treating Provider/Extender: Luan Moore, Neelam Weeks in Treatment: 5 Subjective Chief Complaint Information obtained from Patient Bilateral LE Ulcers History of Present Illness (HPI) 06-19-2022 upon evaluation today patient appears to be doing somewhat poorly in regard to a wound that began around Christmas time on the patient's ankle and lower extremities bilaterally. He tells me that he has been having quite significant pain at this point unfortunately.  He did go to the emergency department on March 2. However this has been going on since around Christmas. On March 2 he had x-rays that were negative and I did review that today as well. The patient also has what appears to be chronic venous stasis which is an ongoing issue for him  here as well and I think that is part of the issue. Right now it is just hard for him to wear anything compression wise although long-term I think he is going to need something. He does smoke currently that something that he needs to stop. Patient does have a history of diabetes mellitus type 2 which is stated to be diet controlled. He also again is a current smoker which I think is something that he needs to get rid of completely. 06-26-2022 upon evaluation today patient appears to be doing well currently in regard to his wounds all things considered he tells me the pain is a little bit better he has been on the correct antibiotic for only 2 days so he is seeing some improvement already this is already a good sign. Fortunately there does not appear to be any signs of infection locally nor systemically which is great news. No fevers, chills, nausea, vomiting, or diarrhea. 07-03-2022 upon evaluation today patient appears to be doing better in regard to his leg the infection is clearing very well and I am actually much more pleased today with where things stand compared to where they were. Fortunately I do not see any signs of active infection locally or systemically at this time which is great news. No fevers, chills, nausea, vomiting, or diarrhea. 07-10-2022 upon evaluation today patient appears to be doing better I do not see any signs of infection I think he is much better in that regard. Fortunately I do not see any evidence of active infection locally or systemically which is great news. 07-17-2022 upon evaluation today patient actually appears to be doing better in regard to his wounds. He is showing some signs of  improvement we actually now able to wrap him and I do think he is now allow me to do some debridement today as well which will also be good news. Fortunately I do not see any evidence of active infection locally nor systemically which is great news. 07-24-2022 upon evaluation patient actually appears to be making good progress here and I am very pleased with where we stand. I do think that he is showing signs of improvement I think that the medication is doing a good job as far as the new epithelial growth is concerned. He is in agreement with continuing with the current regimen the compression wrap has been beneficial the alginate did well but I do think we probably need to change this more than once a week. Objective Constitutional Well-nourished and well-hydrated in no acute distress. Vitals Time Taken: 2:14 PM, Height: 70 in, Weight: 180 lbs, BMI: 25.8, Temperature: 98.1 F, Pulse: 94 bpm, Respiratory Rate: 18 breaths/min, Blood Pressure: 137/87 mmHg. Respiratory normal breathing without difficulty. Psychiatric this patient is able to make decisions and demonstrates good insight into disease process. Alert and Oriented x 3. pleasant and cooperative. [name], [name] ([id number]) [id number] Page 5 of 6 General Notes: Upon inspection patient's wound bed actually showed signs of good granulation and epithelization at this point. With that being said I do believe that we need to try to change this more than just once a week I would recommend a twice a week dressing change with a nurse visit in between. Integumentary (Hair, Skin) Wound #1 status is Open. Original cause of wound was Gradually Appeared. The date acquired was: 03/24/2022. The wound has been in treatment 5 weeks. The wound is located on the Left,Circumferential Lower Leg. The wound measures 8.2cm length x 12.8cm width x 0.1cm depth; 82.435cm^2  area and 8.244cm^3 volume. There is Fat Layer (Subcutaneous Tissue)  exposed. There is no tunneling or undermining noted. There is a large amount of serosanguineous drainage noted. There is small (1-33%) red, pink granulation within the wound bed. There is a large (67-100%) amount of necrotic tissue within the wound bed including Adherent Slough. General Notes: increased drainage this dressing change, surrounding skin more red Wound #2 status is Open. Original cause of wound was Gradually Appeared. The date acquired was: 03/24/2022. The wound has been in treatment 5 weeks. The wound is located on the Right,Lateral Ankle. The wound measures 2.8cm length x 0.6cm width x 0.2cm depth; 1.319cm^2 area and 0.264cm^3 volume. There is Fat Layer (Subcutaneous Tissue) exposed. There is no tunneling or undermining noted. There is a medium amount of serosanguineous drainage noted. There is medium (34-66%) red, pink granulation within the wound bed. There is a medium (34-66%) amount of necrotic tissue within the wound bed including Adherent Slough. 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 Bathing/ Shower/ Hygiene: May shower; gently cleanse wound with antibacterial soap, rinse and pat dry prior to dressing wounds No tub bath. Edema Control - Lymphedema / Segmental Compressive Device / Other: 3 Layer Compression System for Lymphedema. - Urgo 2 layer wrap used size regular Elevate, Exercise Daily and Avoid Standing for Long Periods of Time. Elevate legs to the level of the heart and pump ankles as often as possible Elevate leg(s) parallel to the floor when  sitting. DO YOUR BEST to sleep in the bed at night. DO NOT sleep in your recliner. Long hours of sitting in a recliner leads to swelling of the legs and/or potential wounds on your backside. Medications-Please add to medication list.: P.O. Antibiotics - continue taking cipro as prescribed WOUND #1: - Lower Leg Wound Laterality: Left, Circumferential Cleanser: Byram Ancillary Kit - 15 Day Supply (Generic) 1 x Per Week/30 Days Discharge Instructions: Use supplies as instructed; Kit contains: (15) Saline Bullets; (15) 3x3 Gauze; 15 pr Gloves Cleanser: Soap and Water 1 x Per Week/30 Days Discharge Instructions: Gently cleanse wound with antibacterial soap, rinse and pat dry prior to dressing wounds Cleanser: Vashe 5.8 (oz) 1 x Per Week/30 Days Discharge Instructions: Use vashe 5.8 (oz) as directed Topical: Triamcinolone Acetonide Cream, 0.1%, 15 (g) tube 1 x Per Week/30 Days Discharge Instructions: Apply as directed by provider. TO LEG Prim Dressing: Silvercel 4 1/4x 4 1/4 (in/in) (Generic) 1 x Per Week/30 Days ary Discharge Instructions: Apply Silvercel 4 1/4x 4 1/4 (in/in) as instructed Secondary Dressing: Zetuvit Plus 4x4 (in/in) 1 x Per Week/30 Days Com pression Wrap: Urgo K2 Lite, two layer compression system, regular 1 x Per Week/30 Days WOUND #2: - Ankle Wound Laterality: Right, Lateral Cleanser: Byram Ancillary Kit - 15 Day Supply (Generic) 1 x Per Week/30 Days Discharge Instructions: Use supplies as instructed; Kit contains: (15) Saline Bullets; (15) 3x3 Gauze; 15 pr Gloves Cleanser: Soap and Water 1 x Per Week/30 Days Discharge Instructions: Gently cleanse wound with antibacterial soap, rinse and pat dry prior to dressing wounds Cleanser: Vashe 5.8 (oz) 1 x Per Week/30 Days Discharge Instructions: Use vashe 5.8 (oz) as directed Topical: Triamcinolone Acetonide Cream, 0.1%,  15 (g) tube 1 x Per Week/30 Days Discharge Instructions: Apply as directed by provider. TO LEG Prim Dressing:  Silvercel 4 1/4x 4 1/4 (in/in) (Generic) 1 x Per Week/30 Days ary Discharge Instructions: Apply Silvercel 4 1/4x 4 1/4 (in/in) as instructed Secondary Dressing: Zetuvit Plus 4x4 (in/in) 1 x Per Week/30 Days Com pression Wrap: Urgo K2 Lite, two layer compression system, regular 1 x Per Week/30 Days 1. I would recommend that we have the patient continue to monitor for any signs of infection or worsening. Obviously based on what we are seeing I do believe that we are moving in the right direction which is great news. 2. I am also can recommend that we have the patient continue to utilize a 3 layer compression wrap which is doing a good job. 3. He will continue to elevate his legs much as possible good news is that his pain is significantly improved. We will see patient back for reevaluation in 1 week here in the clinic. If anything worsens or changes patient will contact our office for additional [name], [name] ([id number]) [id number] Page 6 of 6 recommendations. Electronic Signature(s) Signed: 07/24/2022 5:09:13 PM By: Allen Derry PA-C Entered By: Allen Derry on 07/24/2022 17:09:13 -------------------------------------------------------------------------------- SuperBill Details Patient Name: Date of Service: [name], [name] 07/24/2022 Medical Record Number: [id number] Patient Account Number: [id number] Date of Birth/Sex: Treating RN: 04-Feb-1961 (62 y.o. [name] Primary Care Provider: Yves Dill Other Clinician: Referring Provider: Treating Provider/Extender: Luan Moore, Neelam Weeks in Treatment: 5 Diagnosis Coding ICD-10 Codes Code Description [phone] Chronic venous hypertension (idiopathic) with ulcer and inflammation of bilateral lower extremity L97.312 Non-pressure chronic ulcer of right ankle with fat layer exposed L97.822 Non-pressure chronic ulcer of other part of left lower leg with fat layer exposed F17.218 Nicotine dependence, cigarettes,  with other nicotine-induced disorders E11.622 Type 2 diabetes mellitus with other skin ulcer Facility Procedures : CPT4 Code: [id number] Description: 99214 - WOUND CARE VISIT-LEV 4 EST PT Modifier: Quantity: 1 Physician Procedures : CPT4 Code Description Modifier [id number] - WC PHYS LEVEL 3 - EST PT ICD-10 Diagnosis Description I87.333 Chronic venous hypertension (idiopathic) with ulcer and inflammation of bilateral lower extremity L97.312 Non-pressure chronic ulcer of right  ankle with fat layer exposed L97.822 Non-pressure chronic ulcer of other part of left lower leg with fat layer exposed F17.218 Nicotine dependence, cigarettes, with other nicotine-induced disorders Quantity: 1 Electronic Signature(s) Signed: 07/24/2022 5:09:29 PM By: Allen Derry PA-C Previous Signature: 07/24/2022 4:02:19 PM Version By: Angelina Pih Entered By: Allen Derry on 07/24/2022 17:09:29

## 2022-07-25 NOTE — Progress Notes (Addendum)
Mccrory, Uziah ([id number]) [id number] Page 1 of 9 Visit Report for 07/24/2022 Arrival Information Details Patient Name: Date of Service: [name], [name] 07/24/2022 2:00 PM Medical Record Number: [id number] Patient Account Number: [id number] Date of Birth/Sex: Treating RN: 1961-01-18 (62 y.o. [name] Primary Care Kryslyn Helbig: Yves Dill Other Clinician: Referring Janeece Blok: Treating Jakai Risse/Extender: Luan Moore, Neelam Weeks in Treatment: 5 Visit Information History Since Last Visit Added or deleted any medications: No Patient Arrived: Ambulatory Any new allergies or adverse reactions: No Arrival Time: 14:13 Had a fall or experienced change in No Accompanied By: self activities of daily living that may affect Transfer Assistance: None risk of falls: Patient Identification Verified: Yes Hospitalized since last visit: No Secondary Verification Process Completed: Yes Has Dressing in Place as Prescribed: Yes Patient Requires Transmission-Based Precautions: No Has Compression in Place as Prescribed: Yes Patient Has Alerts: No Pain Present Now: Yes Electronic Signature(s) Signed: 07/24/2022 4:36:23 PM By: Angelina Pih Entered By: Angelina Pih on 07/24/2022 14:14:06 -------------------------------------------------------------------------------- Clinic Level of Care Assessment Details Patient Name: Date of Service: [name], [name] 07/24/2022 2:00 PM Medical Record Number: [id number] Patient Account Number: [id number] Date of Birth/Sex: Treating RN: [birth year] (62 y.o. [name] Primary Care Ollin Hochmuth: Yves Dill Other Clinician: Referring Sharel Behne: Treating Kendalynn Wideman/Extender: Luan Moore, Neelam Weeks in Treatment: 5 Clinic Level of Care Assessment Items TOOL 4 Quantity Score []  - 0 Use when only an EandM is performed on FOLLOW-UP visit ASSESSMENTS - Nursing Assessment / Reassessment X- 1 10 Reassessment of Co-morbidities  (includes updates in patient status) X- 1 5 Reassessment of Adherence to Treatment Plan ASSESSMENTS - Wound and Skin A ssessment / Reassessment []  - 0 Simple Wound Assessment / Reassessment - one wound X- 2 5 Complex Wound Assessment / Reassessment - multiple wounds []  - 0 Dermatologic / Skin Assessment (not related to wound area) ASSESSMENTS - Focused Assessment X- 1 5 Circumferential Edema Measurements - multi extremities []  - 0 Nutritional Assessment / Counseling / Intervention []  - 0 Lower Extremity Assessment (monofilament, tuning fork, pulses) []  - 0 Peripheral Arterial Disease Assessment (using hand held doppler) ASSESSMENTS - Ostomy and/or Continence Assessment and Care []  - 0 Incontinence Assessment and Management []  - 0 Ostomy Care Assessment and Management (repouching, etc.) PROCESS - Coordination of Care X - Simple Patient / Family Education for ongoing care 1 15 []  - 0 Complex (extensive) Patient / Family Education for ongoing care Reisen, [name] ([id number]) [id number] Page 2 of 9 X- 1 10 Staff obtains Consents, Records, T Results / Process Orders est []  - 0 Staff telephones HHA, Nursing Homes / Clarify orders / etc []  - 0 Routine Transfer to another Facility (non-emergent condition) []  - 0 Routine Hospital Admission (non-emergent condition) []  - 0 New Admissions / Manufacturing engineer / Ordering NPWT Apligraf, etc. , []  - 0 Emergency Hospital Admission (emergent condition) X- 1 10 Simple Discharge Coordination []  - 0 Complex (extensive) Discharge Coordination PROCESS - Special Needs []  - 0 Pediatric / Minor Patient Management []  - 0 Isolation Patient Management []  - 0 Hearing / Language / Visual special needs []  - 0 Assessment of Community assistance (transportation, D/C planning, etc.) []  - 0 Additional assistance / Altered mentation []  - 0 Support Surface(s) Assessment (bed, cushion, seat, etc.) INTERVENTIONS -  Wound Cleansing / Measurement []  - 0 Simple Wound Cleansing - one wound X- 2 5 Complex Wound Cleansing - multiple wounds X- 1 5 Wound Imaging (photographs - any number of wounds) []  -  0 Wound Tracing (instead of photographs) []  - 0 Simple Wound Measurement - one wound X- 2 5 Complex Wound Measurement - multiple wounds INTERVENTIONS - Wound Dressings []  - 0 Small Wound Dressing one or multiple wounds X- 2 15 Medium Wound Dressing one or multiple wounds []  - 0 Large Wound Dressing one or multiple wounds X- 1 5 Application of Medications - topical []  - 0 Application of Medications - injection INTERVENTIONS - Miscellaneous []  - 0 External ear exam []  - 0 Specimen Collection (cultures, biopsies, blood, body fluids, etc.) []  - 0 Specimen(s) / Culture(s) sent or taken to Lab for analysis []  - 0 Patient Transfer (multiple staff / Nurse, adult / Similar devices) []  - 0 Simple Staple / Suture removal (25 or less) []  - 0 Complex Staple / Suture removal (26 or more) []  - 0 Hypo / Hyperglycemic Management (close monitor of Blood Glucose) []  - 0 Ankle / Brachial Index (ABI) - do not check if billed separately X- 1 5 Vital Signs Has the patient been seen at the hospital within the last three years: Yes Total Score: 130 Level Of Care: New/Established - Level 4 Electronic Signature(s) Signed: 07/24/2022 4:36:23 PM By: Angelina Pih Entered By: Angelina Pih on 07/24/2022 16:02:13 Choudhry, [name] ([id number]) [id number] Page 3 of 9 -------------------------------------------------------------------------------- Encounter Discharge Information Details Patient Name: Date of Service: [name], [name] 07/24/2022 2:00 PM Medical Record Number: [id number] Patient Account Number: [id number] Date of Birth/Sex: Treating RN: 08/06/1960 (62 y.o. [name] Primary Care Myrah Strawderman: Yves Dill Other Clinician: Referring Bron Snellings: Treating Weslie Pretlow/Extender: Luan Moore, Neelam Weeks in Treatment: 5 Encounter Discharge Information Items Discharge Condition: Stable Ambulatory Status: Ambulatory Discharge Destination: Home Transportation: Private Auto Accompanied By: self Schedule Follow-up Appointment: Yes Clinical Summary of Care: Electronic Signature(s) Signed: 07/24/2022 4:04:20 PM By: Angelina Pih Entered By: Angelina Pih on 07/24/2022 16:04:20 -------------------------------------------------------------------------------- Lower Extremity Assessment Details Patient Name: Date of Service: [name], [name] 07/24/2022 2:00 PM Medical Record Number: [id number] Patient Account Number: [id number] Date of Birth/Sex: Treating RN: 1960-04-22 (62 y.o. [name] Primary Care Raidon Swanner: Yves Dill Other Clinician: Referring Nakya Weyand: Treating Raushanah Osmundson/Extender: Luan Moore, Neelam Weeks in Treatment: 5 Edema Assessment Assessed: [Left: No] [Right: No] Edema: [Left: Yes] [Right: Yes] Calf Left: Right: Point of Measurement: 33 cm From Medial Instep 32.4 cm 35 cm Ankle Left: Right: Point of Measurement: 12 cm From Medial Instep 22 cm 22.5 cm Vascular Assessment Pulses: Dorsalis Pedis Palpable: [Left:Yes] [Right:Yes] Posterior Tibial Palpable: [Left:Yes] [Right:Yes] Electronic Signature(s) Signed: 07/24/2022 4:36:23 PM By: Angelina Pih Entered By: Angelina Pih on 07/24/2022 14:34:26 -------------------------------------------------------------------------------- Multi Wound Chart Details Patient Name: Date of Service: [name], [name] 07/24/2022 2:00 PM Medical Record Number: [id number] Patient Account Number: [id number] Date of Birth/Sex: Treating RN: May 09, 1960 (62 y.o. [name] Primary Care Torion Hulgan: Yves Dill Other Clinician: Referring Martha Soltys: Treating Jalani Rominger/Extender: Luan Moore, Neelam Weeks in Treatment: 5 Stehlin, [place] ([id number]) [id number] Page 4 of 9 Vital  Signs Height(in): 70 Pulse(bpm): 94 Weight(lbs): 180 Blood Pressure(mmHg): 137/87 Body Mass Index(BMI): 25.8 Temperature(F): 98.1 Respiratory Rate(breaths/min): 18 [1:Photos:] [N/A:N/A] Left, Circumferential Lower Leg Right, Lateral Ankle N/A Wound Location: Gradually Appeared Gradually Appeared N/A Wounding Event: Venous Leg Ulcer Venous Leg Ulcer N/A Primary Etiology: Rheumatoid Arthritis Rheumatoid Arthritis N/A Comorbid History: 03/24/2022 03/24/2022 N/A Date Acquired: 5 5 N/A Weeks of Treatment: Open Open N/A Wound Status: No No N/A Wound Recurrence: 8.2x12.8x0.1 2.8x0.6x0.2 N/A Measurements L x W x D (cm) 82.435 1.319 N/A  A (cm) : rea 8.244 0.264 N/A Volume (cm) : 11.80% 63.70% N/A % Reduction in A rea: 11.80% 63.60% N/A % Reduction in Volume: Full Thickness Without Exposed Full Thickness Without Exposed N/A Classification: Support Structures Support Structures Large Medium N/A Exudate A mount: Serosanguineous Serosanguineous N/A Exudate Type: red, brown red, brown N/A Exudate Color: Small (1-33%) Medium (34-66%) N/A Granulation A mount: Red, Pink Red, Pink N/A Granulation Quality: Large (67-100%) Medium (34-66%) N/A Necrotic A mount: Fat Layer (Subcutaneous Tissue): Yes Fat Layer (Subcutaneous Tissue): Yes N/A Exposed Structures: None None N/A Epithelialization: increased drainage this dressing N/A N/A Assessment Notes: change, surrounding skin more red Treatment Notes Electronic Signature(s) Signed: 07/24/2022 3:59:08 PM By: Angelina Pih Entered By: Angelina Pih on 07/24/2022 15:59:08 -------------------------------------------------------------------------------- Multi-Disciplinary Care Plan Details Patient Name: Date of Service: [name], [name] 07/24/2022 2:00 PM Medical Record Number: [id number] Patient Account Number: [id number] Date of Birth/Sex: Treating RN: 01-28-1961 (62 y.o. [name] Primary Care Tramel Westbrook: Yves Dill  Other Clinician: Referring Dwanna Goshert: Treating Arlicia Paquette/Extender: Luan Moore, Neelam Weeks in Treatment: 5 Active Inactive Deason, [name] ([id number]) [id number] Page 5 of 9 Venous Leg Ulcer Nursing Diagnoses: Actual venous Insuffiency (use after diagnosis is confirmed) Goals: Patient will maintain optimal edema control Date Initiated: 07/10/2022 Target Resolution Date: 08/05/2022 Goal Status: Active Patient/caregiver will verbalize understanding of disease process and disease management Date Initiated: 07/10/2022 Target Resolution Date: 08/05/2022 Goal Status: Active Verify adequate tissue perfusion prior to therapeutic compression application Date Initiated: 07/10/2022 Date Inactivated: 07/10/2022 Target Resolution Date: 07/10/2022 Goal Status: Met Interventions: Assess peripheral edema status every visit. Compression as ordered Provide education on venous insufficiency Treatment Activities: Therapeutic compression applied : 07/10/2022 Notes: Wound/Skin Impairment Nursing Diagnoses: Impaired tissue integrity Knowledge deficit related to ulceration/compromised skin integrity Goals: Ulcer/skin breakdown will have a volume reduction of 30% by week 4 Date Initiated: 06/19/2022 Date Inactivated: 07/17/2022 Target Resolution Date: 07/17/2022 Goal Status: Met Ulcer/skin breakdown will have a volume reduction of 50% by week 8 Date Initiated: 06/19/2022 Target Resolution Date: 08/14/2022 Goal Status: Active Ulcer/skin breakdown will have a volume reduction of 80% by week 12 Date Initiated: 06/19/2022 Target Resolution Date: 09/11/2022 Goal Status: Active Ulcer/skin breakdown will heal within 14 weeks Date Initiated: 06/19/2022 Target Resolution Date: 09/25/2022 Goal Status: Active Interventions: Assess patient/caregiver ability to obtain necessary supplies Assess patient/caregiver ability to perform ulcer/skin care regimen upon admission and as needed Assess  ulceration(s) every visit Provide education on ulcer and skin care Treatment Activities: Referred to DME Ephriam Turman for dressing supplies : 06/19/2022 Skin care regimen initiated : 06/19/2022 Notes: Electronic Signature(s) Signed: 07/24/2022 4:02:30 PM By: Angelina Pih Entered By: Angelina Pih on 07/24/2022 16:02:29 -------------------------------------------------------------------------------- Pain Assessment Details Patient Name: Date of Service: [name], [name] 07/24/2022 2:00 PM Medical Record Number: [id number] Patient Account Number: [id number] Date of Birth/Sex: Treating RN: 1960-11-12 (62 y.o. [name] Primary Care Isobelle Tuckett: Yves Dill Other Clinician: Referring Vineta Carone: Treating Whitlee Sluder/Extender: Luan Moore, Neelam Weeks in Treatment: 5 Active Problems Furgeson, [name] ([id number]) [id number] Page 6 of 9 Location of Pain Severity and Description of Pain Patient Has Paino Yes Site Locations Rate the pain. Current Pain Level: 2 Pain Management and Medication Current Pain Management: Electronic Signature(s) Signed: 07/24/2022 4:36:23 PM By: Angelina Pih Entered By: Angelina Pih on 07/24/2022 14:15:57 -------------------------------------------------------------------------------- Patient/Caregiver Education Details Patient Name: Date of Service: [name], [name] 4/25/2024andnbsp2:00 PM Medical Record Number: [id number] Patient Account Number: [id number] Date of Birth/Gender: Treating RN: 07-Jun-1960 (62 y.o. [name] Primary  Care Physician: Yves Dill Other Clinician: Referring Physician: Treating Physician/Extender: Leslee Home in Treatment: 5 Education Assessment Education Provided To: Patient Education Topics Provided Wound/Skin Impairment: Handouts: Caring for Your Ulcer Methods: Explain/Verbal Responses: State content correctly Electronic Signature(s) Signed: 07/24/2022 4:36:23 PM By:  Angelina Pih Entered By: Angelina Pih on 07/24/2022 16:03:24 -------------------------------------------------------------------------------- Wound Assessment Details Patient Name: Date of Service: [name], [name] 07/24/2022 2:00 PM Medical Record Number: [id number] Patient Account Number: [id number] Date of Birth/Sex: Treating RN: 11/27/1960 (62 y.o. [name] Primary Care Barney Gertsch: Yves Dill Other Clinician: Referring Makaya Juneau: Treating Pegeen Stiger/Extender: Luan Moore, Neelam Weeks in Treatment: 5 Wound Status Wound Number: 1 Primary Etiology: Venous Leg Ulcer Sroka, Makell ([id number]) [id number] Page 7 of 9 Wound Location: Left, Circumferential Lower Leg Wound Status: Open Wounding Event: Gradually Appeared Comorbid History: Rheumatoid Arthritis Date Acquired: 03/24/2022 Weeks Of Treatment: 5 Clustered Wound: No Photos Wound Measurements Length: (cm) 8.2 Width: (cm) 12.8 Depth: (cm) 0.1 Area: (cm) 82.435 Volume: (cm) 8.244 % Reduction in Area: 11.8% % Reduction in Volume: 11.8% Epithelialization: None Tunneling: No Undermining: No Wound Description Classification: Full Thickness Without Exposed Support Structures Exudate Amount: Large Exudate Type: Serosanguineous Exudate Color: red, brown Foul Odor After Cleansing: No Slough/Fibrino Yes Wound Bed Granulation Amount: Small (1-33%) Exposed Structure Granulation Quality: Red, Pink Fat Layer (Subcutaneous Tissue) Exposed: Yes Necrotic Amount: Large (67-100%) Necrotic Quality: Adherent Slough Assessment Notes increased drainage this dressing change, surrounding skin more red Treatment Notes Wound #1 (Lower Leg) Wound Laterality: Left, Circumferential Cleanser Byram Ancillary Kit - 15 Day Supply Discharge Instruction: Use supplies as instructed; Kit contains: (15) Saline Bullets; (15) 3x3 Gauze; 15 pr Gloves Soap and Water Discharge Instruction: Gently cleanse wound  with antibacterial soap, rinse and pat dry prior to dressing wounds Vashe 5.8 (oz) Discharge Instruction: Use vashe 5.8 (oz) as directed Peri-Wound Care Topical Triamcinolone Acetonide Cream, 0.1%, 15 (g) tube Discharge Instruction: Apply as directed by Wanda Cellucci. TO LEG Primary Dressing Silvercel 4 1/4x 4 1/4 (in/in) Discharge Instruction: Apply Silvercel 4 1/4x 4 1/4 (in/in) as instructed Secondary Dressing Zetuvit Plus 4x4 (in/in) Secured With Compression Wrap Urgo K2 Lite, two layer compression system, regular Compression Stockings Add-Ons Lunney, Fritz ([id number]) [id number] Page 8 of 9 Electronic Signature(s) Signed: 07/24/2022 4:36:23 PM By: Angelina Pih Entered By: Angelina Pih on 07/24/2022 14:33:24 -------------------------------------------------------------------------------- Wound Assessment Details Patient Name: Date of Service: [name], [name] 07/24/2022 2:00 PM Medical Record Number: [id number] Patient Account Number: [id number] Date of Birth/Sex: Treating RN: 1961/01/13 (62 y.o. [name] Primary Care Lynnsey Barbara: Yves Dill Other Clinician: Referring Katera Rybka: Treating Camden Mazzaferro/Extender: Luan Moore, Neelam Weeks in Treatment: 5 Wound Status Wound Number: 2 Primary Etiology: Venous Leg Ulcer Wound Location: Right, Lateral Ankle Wound Status: Open Wounding Event: Gradually Appeared Comorbid History: Rheumatoid Arthritis Date Acquired: 03/24/2022 Weeks Of Treatment: 5 Clustered Wound: No Photos Wound Measurements Length: (cm) 2.8 Width: (cm) 0.6 Depth: (cm) 0.2 Area: (cm) 1.319 Volume: (cm) 0.264 % Reduction in Area: 63.7% % Reduction in Volume: 63.6% Epithelialization: None Tunneling: No Undermining: No Wound Description Classification: Full Thickness Without Exposed Support Structures Exudate Amount: Medium Exudate Type: Serosanguineous Exudate Color: red, brown Foul Odor After Cleansing:  No Slough/Fibrino Yes Wound Bed Granulation Amount: Medium (34-66%) Exposed Structure Granulation Quality: Red, Pink Fat Layer (Subcutaneous Tissue) Exposed: Yes Necrotic Amount: Medium (34-66%) Necrotic Quality: Adherent Slough Treatment Notes Wound #2 (Ankle) Wound Laterality: Right, Lateral Cleanser Byram Ancillary Kit - 15 Day Supply Discharge Instruction: Use supplies  as instructed; Kit contains: (15) Saline Bullets; (15) 3x3 Gauze; 15 pr Gloves Soap and Water Discharge Instruction: Gently cleanse wound with antibacterial soap, rinse and pat dry prior to dressing wounds Vashe 5.8 (oz) Discharge Instruction: Use vashe 5.8 (oz) as directed Peri-Wound Care Lem, Dereon ([id number]) [id number] Page 9 of 9 Topical Triamcinolone Acetonide Cream, 0.1%, 15 (g) tube Discharge Instruction: Apply as directed by Hayven Croy. TO LEG Primary Dressing Silvercel 4 1/4x 4 1/4 (in/in) Discharge Instruction: Apply Silvercel 4 1/4x 4 1/4 (in/in) as instructed Secondary Dressing Zetuvit Plus 4x4 (in/in) Secured With Compression Wrap Urgo K2 Lite, two layer compression system, regular Compression Stockings Add-Ons Electronic Signature(s) Signed: 07/24/2022 4:36:23 PM By: Angelina Pih Entered By: Angelina Pih on 07/24/2022 14:33:56 -------------------------------------------------------------------------------- Vitals Details Patient Name: Date of Service: [name], [name] 07/24/2022 2:00 PM Medical Record Number: [id number] Patient Account Number: [id number] Date of Birth/Sex: Treating RN: 06/26/1960 (62 y.o. [name] Primary Care Austen Wygant: Yves Dill Other Clinician: Referring Lisa-Marie Rueger: Treating Elpidia Karn/Extender: Luan Moore, Neelam Weeks in Treatment: 5 Vital Signs Time Taken: 14:14 Temperature (F): 98.1 Height (in): 70 Pulse (bpm): 94 Weight (lbs): 180 Respiratory Rate (breaths/min): 18 Body Mass Index (BMI): 25.8 Blood Pressure (mmHg):  137/87 Reference Range: 80 - 120 mg / dl Electronic Signature(s) Signed: 07/24/2022 4:36:23 PM By: Angelina Pih Entered By: Angelina Pih on 07/24/2022 14:15:39

## 2022-07-28 DIAGNOSIS — E11622 Type 2 diabetes mellitus with other skin ulcer: Secondary | ICD-10-CM | POA: Diagnosis not present

## 2022-07-28 NOTE — Progress Notes (Addendum)
[name] ([id number]) [id number] Page 1 of 5 Visit Report for 07/28/2022 Arrival Information Details Patient Name: Date of Service: [name] 07/28/2022 7:30 A M Medical Record Number: [id number] Patient Account Number: [id number] Date of Birth/Sex: Treating RN: Nov 07, 1960 (63 y.o. [name] Primary Care [name]: [name] Other Clinician: Referring [name]: Treating [name]/Extender: [name], [name] in Treatment: 5 Visit Information History Since Last Visit Added or deleted any medications: No Patient Arrived: Ambulatory Any new allergies or adverse reactions: No Arrival Time: 08:12 Had a fall or experienced change in No Accompanied By: self activities of daily living that may affect Transfer Assistance: None risk of falls: Patient Identification Verified: Yes Hospitalized since last visit: No Secondary Verification Process Completed: Yes Has Dressing in Place as Prescribed: Yes Patient Requires Transmission-Based Precautions: No Has Compression in Place as Prescribed: Yes Patient Has Alerts: No Pain Present Now: No Electronic Signature(s) Signed: 07/28/2022 8:13:02 AM By: [name] Entered By: [name] on 07/28/2022 08:13:02 -------------------------------------------------------------------------------- Clinic Level of Care Assessment Details Patient Name: Date of Service: [name] 07/28/2022 7:30 A M Medical Record Number: [id number] Patient Account Number: [id number] Date of Birth/Sex: Treating RN: October 30, 1960 (62 y.o. [name] Primary Care [name]: [name] Other Clinician: Referring [name]: Treating [name]/Extender: [name], [name] in Treatment: 5 Clinic Level of Care Assessment Items TOOL 1 Quantity Score []  - 0 Use when EandM and Procedure is performed on INITIAL visit ASSESSMENTS - Nursing Assessment / Reassessment []  - 0 General Physical Exam (combine  w/ comprehensive assessment (listed just below) when performed on new pt. evals) []  - 0 Comprehensive Assessment (HX, ROS, Risk Assessments, Wounds Hx, etc.) ASSESSMENTS - Wound and Skin Assessment / Reassessment []  - 0 Dermatologic / Skin Assessment (not related to wound area) ASSESSMENTS - Ostomy and/or Continence Assessment and Care []  - 0 Incontinence Assessment and Management []  - 0 Ostomy Care Assessment and Management (repouching, etc.) PROCESS - Coordination of Care []  - 0 Simple Patient / Family Education for ongoing care []  - 0 Complex (extensive) Patient / Family Education for ongoing care []  - 0 Staff obtains Chiropractor, Records, T Results / Process Orders est []  - 0 Staff telephones HHA, Nursing Homes / Clarify orders / etc []  - 0 Routine Transfer to another Facility (non-emergent condition) []  - 0 Routine Hospital Admission (non-emergent condition) []  - 0 New Admissions / Insurance Authorizations / Ordering NPWT Apligraf, etc. , []  - 0 Emergency Hospital Admission (emergent condition) PROCESS - Special Needs [name] ([id number]) [phone].pdf Page 2 of 5 []  - 0 Pediatric / Minor Patient Management []  - 0 Isolation Patient Management []  - 0 Hearing / Language / Visual special needs []  - 0 Assessment of Community assistance (transportation, D/C planning, etc.) []  - 0 Additional assistance / Altered mentation []  - 0 Support Surface(s) Assessment (bed, cushion, seat, etc.) INTERVENTIONS - Miscellaneous []  - 0 External ear exam []  - 0 Patient Transfer (multiple staff / Nurse, adult / Similar devices) []  - 0 Simple Staple / Suture removal (25 or less) []  - 0 Complex Staple / Suture removal (26 or more) []  - 0 Hypo/Hyperglycemic Management (do not check if billed separately) []  - 0 Ankle / Brachial Index (ABI) - do not check if billed separately Has the patient been seen at the hospital within the last three years: Yes Total  Score: 0 Level Of Care: ____ Electronic Signature(s) Unsigned Entered By: [name] on 07/28/2022 08:15:59 -------------------------------------------------------------------------------- Compression Therapy  Details Patient Name: Date of Service: [name] 07/28/2022 7:30 A M Medical Record Number: [id number] Patient Account Number: [id number] Date of Birth/Sex: Treating RN: 07-22-1960 (62 y.o. [name] Primary Care Natayla Cadenhead: [name] Other Clinician: Referring Mansa Willers: Treating [name]/Extender: [name], [name] in Treatment: 5 Compression Therapy Performed for Wound Assessment: Wound #1 Left,Circumferential Lower Leg Performed By: Clinician [name] Pih, RN Compression Type: Double Layer Electronic Signature(s) Signed: 07/28/2022 8:14:23 AM By: [name] Entered By: [name] on 07/28/2022 08:14:23 -------------------------------------------------------------------------------- Compression Therapy Details Patient Name: Date of Service: [name] 07/28/2022 7:30 A M Medical Record Number: [id number] Patient Account Number: [id number] Date of Birth/Sex: Treating RN: 02-12-1961 (62 y.o. [name] Primary Care Korie Streat: [name] Other Clinician: Referring [name]: Treating [name]/Extender: [name], [name] in Treatment: 5 Compression Therapy Performed for Wound Assessment: Wound #2 Right,Lateral Ankle Performed By: Clinician [name] Pih, RN Compression Type: Double Layer Electronic Signature(s) Signed: 07/28/2022 8:14:40 AM By: [name] Entered By: [name] on 07/28/2022 08:14:39 [name] ([id number]) [id number] Page 3 of 5 -------------------------------------------------------------------------------- Encounter Discharge Information Details Patient Name: Date of Service: [name] 07/28/2022 7:30 A M Medical Record Number: [id number] Patient  Account Number: [id number] Date of Birth/Sex: Treating RN: 1960-05-19 (62 y.o. [name] Primary Care [name]: [name] Other Clinician: Referring [name]: Treating [name]/Extender: [name], [name] in Treatment: 5 Encounter Discharge Information Items Discharge Condition: Stable Ambulatory Status: Ambulatory Discharge Destination: Home Transportation: Private Auto Accompanied By: self Schedule Follow-up Appointment: Yes Clinical Summary of Care: Electronic Signature(s) Signed: 07/28/2022 8:15:53 AM By: [name] Entered By: [name] on 07/28/2022 08:15:53 -------------------------------------------------------------------------------- Wound Assessment Details Patient Name: Date of Service: [name] 07/28/2022 7:30 A M Medical Record Number: [id number] Patient Account Number: [id number] Date of Birth/Sex: Treating RN: April 11, 1960 (62 y.o. [name] Primary Care Raziah Funnell: [name] Other Clinician: Referring Rorie Delmore: Treating Vergene Marland/Extender: [name], [name] in Treatment: 5 Wound Status Wound Number: 1 Primary Etiology: Venous Leg Ulcer Wound Location: Left, Circumferential Lower Leg Wound Status: Open Wounding Event: Gradually Appeared Comorbid History: Rheumatoid Arthritis Date Acquired: 03/24/2022 [name] Of Treatment: 5 Clustered Wound: No Wound Measurements Length: (cm) 8.2 Width: (cm) 12.8 Depth: (cm) 0.1 Area: (cm) 82.435 Volume: (cm) 8.244 % Reduction in Area: 11.8% % Reduction in Volume: 11.8% Epithelialization: None Tunneling: No Undermining: No Wound Description Classification: Full Thickness Without Exposed Suppor Exudate Amount: Large Exudate Type: Serosanguineous Exudate Color: red, brown t Structures Foul Odor After Cleansing: No Slough/Fibrino Yes Wound Bed Granulation Amount: Medium (34-66%) Exposed Structure Granulation Quality: Red, Pink Fat Layer (Subcutaneous  Tissue) Exposed: Yes Necrotic Amount: Medium (34-66%) Necrotic Quality: Adherent Slough Treatment Notes Wound #1 (Lower Leg) Wound Laterality: Left, Circumferential Cleanser Byram Ancillary Kit - 15 Day Supply Discharge Instruction: Use supplies as instructed; Kit contains: (15) Saline Bullets; (15) 3x3 Gauze; 15 pr Gloves Soap and Water Discharge Instruction: Gently cleanse wound with antibacterial soap, rinse and pat dry prior to dressing wounds Vashe 5.8 (oz) Onstott, Zan ([id number]) [id number] Page 4 of 5 Discharge Instruction: Use vashe 5.8 (oz) as directed Peri-Wound Care Topical Triamcinolone Acetonide Cream, 0.1%, 15 (g) tube Discharge Instruction: Apply as directed by Cylie Dor. TO LEG Primary Dressing Silvercel 4 1/4x 4 1/4 (in/in) Discharge Instruction: Apply Silvercel 4 1/4x 4 1/4 (in/in) as instructed Secondary Dressing Zetuvit Plus 4x4 (in/in) Secured With Compression Wrap Urgo K2 Lite, two layer compression system, regular Compression Stockings Add-Ons Electronic  Signature(s) Signed: 07/28/2022 8:13:34 AM By: [name] Entered By: [name] on 07/28/2022 08:13:34 -------------------------------------------------------------------------------- Wound Assessment Details Patient Name: Date of Service: [name] 07/28/2022 7:30 A M Medical Record Number: [id number] Patient Account Number: [id number] Date of Birth/Sex: Treating RN: 02-Oct-1960 (62 y.o. [name] Primary Care Rilei Kravitz: [name] Other Clinician: Referring Chantele Corado: Treating Curstin Schmale/Extender: [name], [name] in Treatment: 5 Wound Status Wound Number: 2 Primary Etiology: Venous Leg Ulcer Wound Location: Right, Lateral Ankle Wound Status: Open Wounding Event: Gradually Appeared Comorbid History: Rheumatoid Arthritis Date Acquired: 03/24/2022 [name] Of Treatment: 5 Clustered Wound: No Wound Measurements Length: (cm) 2.8 Width: (cm)  0.6 Depth: (cm) 0.2 Area: (cm) 1.319 Volume: (cm) 0.264 % Reduction in Area: 63.7% % Reduction in Volume: 63.6% Epithelialization: None Tunneling: No Undermining: No Wound Description Classification: Full Thickness Without Exposed Suppor Exudate Amount: Medium Exudate Type: Serosanguineous Exudate Color: red, brown t Structures Foul Odor After Cleansing: No Slough/Fibrino Yes Wound Bed Granulation Amount: Medium (34-66%) Exposed Structure Granulation Quality: Red, Pink Fat Layer (Subcutaneous Tissue) Exposed: Yes Necrotic Amount: Medium (34-66%) Necrotic Quality: Adherent Slough Treatment Notes Wound #2 (Ankle) Wound Laterality: Right, Lateral Cleanser Byram Ancillary Kit - 15 Day Supply Discharge Instruction: Use supplies as instructed; Kit contains: (15) Saline Bullets; (15) 3x3 Gauze; 15 pr Gloves Soap and Water Guyett, Abdulkareem ([id number]) [id number].pdf Page 5 of 5 Discharge Instruction: Gently cleanse wound with antibacterial soap, rinse and pat dry prior to dressing wounds Vashe 5.8 (oz) Discharge Instruction: Use vashe 5.8 (oz) as directed Peri-Wound Care Topical Triamcinolone Acetonide Cream, 0.1%, 15 (g) tube Discharge Instruction: Apply as directed by Darrio Bade. TO LEG Primary Dressing Silvercel 4 1/4x 4 1/4 (in/in) Discharge Instruction: Apply Silvercel 4 1/4x 4 1/4 (in/in) as instructed Secondary Dressing Zetuvit Plus 4x4 (in/in) Secured With Compression Wrap Urgo K2 Lite, two layer compression system, regular Compression Stockings Add-Ons Electronic Signature(s) Signed: 07/28/2022 8:13:52 AM By: [name] Entered By: [name] on 07/28/2022 08:13:51

## 2022-07-29 NOTE — Progress Notes (Signed)
[name], [name] ([id number]) [id number] Page 1 of 2 Visit Report for 07/28/2022 Physician Orders Details Patient Name: Date of Service: [name], [name] 07/28/2022 7:30 A M Medical Record Number: [id number] Patient Account Number: [id number] Date of Birth/Sex: Treating RN: 02/01/1961 (62 y.o. [name] Primary Care Provider: Yves [name] Other Clinician: Referring Provider: Treating Provider/Extender: [name], [name] in Treatment: 5 Verbal / Phone Orders: No Diagnosis Coding Follow-up Appointments Return Appointment in 1 week. Bathing/ Shower/ Hygiene May shower; gently cleanse wound with antibacterial soap, rinse and pat dry prior to dressing wounds No tub bath. Edema Control - Lymphedema / Segmental Compressive Device / Other Bilateral Lower Extremities 3 Layer Compression System for Lymphedema. - Urgo 2 layer wrap used size regular Elevate, Exercise Daily and A void Standing for Long Periods of Time. Elevate legs to the level of the heart and pump ankles as often as possible Elevate leg(s) parallel to the floor when sitting. DO YOUR BEST to sleep in the bed at night. DO NOT sleep in your recliner. Long hours of sitting in a recliner leads to swelling of the legs and/or potential wounds on your backside. Medications-Please add to medication list. ntibiotics - continue taking cipro as prescribed P.O. A Wound Treatment Wound #1 - Lower Leg Wound Laterality: Left, Circumferential Cleanser: Byram Ancillary Kit - 15 Day Supply (Generic) 1 x Per Week/30 Days Discharge Instructions: Use supplies as instructed; Kit contains: (15) Saline Bullets; (15) 3x3 Gauze; 15 pr Gloves Cleanser: Soap and Water 1 x Per Week/30 Days Discharge Instructions: Gently cleanse wound with antibacterial soap, rinse and pat dry prior to dressing wounds Cleanser: Vashe 5.8 (oz) 1 x Per Week/30 Days Discharge Instructions: Use vashe 5.8 (oz) as directed Topical:  Triamcinolone Acetonide Cream, 0.1%, 15 (g) tube 1 x Per Week/30 Days Discharge Instructions: Apply as directed by provider. TO LEG Prim Dressing: Silvercel 4 1/4x 4 1/4 (in/in) (Generic) 1 x Per Week/30 Days ary Discharge Instructions: Apply Silvercel 4 1/4x 4 1/4 (in/in) as instructed Secondary Dressing: Zetuvit Plus 4x4 (in/in) 1 x Per Week/30 Days Compression Wrap: Urgo K2 Lite, two layer compression system, regular 1 x Per Week/30 Days Wound #2 - Ankle Wound Laterality: Right, Lateral Cleanser: Byram Ancillary Kit - 15 Day Supply (Generic) 1 x Per Week/30 Days Discharge Instructions: Use supplies as instructed; Kit contains: (15) Saline Bullets; (15) 3x3 Gauze; 15 pr Gloves Cleanser: Soap and Water 1 x Per Week/30 Days Discharge Instructions: Gently cleanse wound with antibacterial soap, rinse and pat dry prior to dressing wounds Cleanser: Vashe 5.8 (oz) 1 x Per Week/30 Days Discharge Instructions: Use vashe 5.8 (oz) as directed Topical: Triamcinolone Acetonide Cream, 0.1%, 15 (g) tube 1 x Per Week/30 Days Discharge Instructions: Apply as directed by provider. TO LEG Prim Dressing: Silvercel 4 1/4x 4 1/4 (in/in) (Generic) 1 x Per Week/30 Days ary Discharge Instructions: Apply Silvercel 4 1/4x 4 1/4 (in/in) as instructed Secondary Dressing: Zetuvit Plus 4x4 (in/in) 1 x Per Week/30 Days Compression Wrap: Urgo K2 Lite, two layer compression system, regular 1 x Per Week/30 Days [name], [name] ([id number]) [id number] Page 2 of 2 Electronic Signature(s) Signed: 07/28/2022 8:15:29 AM By: [name] Signed: 07/28/2022 6:40:35 PM By: [name] Derry PA-C Entered By: [name] on 07/28/2022 08:15:29 -------------------------------------------------------------------------------- SuperBill Details Patient Name: Date of Service: [name], [name] 07/28/2022 Medical Record Number: [id number] Patient Account Number: [id number] Date of Birth/Sex: Treating RN: 1961-01-27 (62  y.o. [name] Primary Care Provider: Yves [name] Other Clinician: Referring  Provider: Treating Provider/Extender: [name], [name] in Treatment: 5 Diagnosis Coding ICD-10 Codes Code Description [phone] Chronic venous hypertension (idiopathic) with ulcer and inflammation of bilateral lower extremity L97.312 Non-pressure chronic ulcer of right ankle with fat layer exposed L97.822 Non-pressure chronic ulcer of other part of left lower leg with fat layer exposed F17.218 Nicotine dependence, cigarettes, with other nicotine-induced disorders E11.622 Type 2 diabetes mellitus with other skin ulcer Facility Procedures : CPT4: Code [id number] 29 fo Description: 581 BILATERAL: Application of multi-layer venous compression system; leg (below knee), including ankle and ot. Modifier: Quantity: 1 Electronic Signature(s) Signed: 07/28/2022 8:16:09 AM By: [name] Signed: 07/28/2022 6:40:35 PM By: [name] Derry PA-C Entered By: [name] on 07/28/2022 08:16:08

## 2022-07-31 ENCOUNTER — Encounter: Payer: 59 | Attending: Physician Assistant | Admitting: Physician Assistant

## 2022-07-31 DIAGNOSIS — I872 Venous insufficiency (chronic) (peripheral): Secondary | ICD-10-CM | POA: Diagnosis not present

## 2022-07-31 DIAGNOSIS — L97312 Non-pressure chronic ulcer of right ankle with fat layer exposed: Secondary | ICD-10-CM | POA: Insufficient documentation

## 2022-07-31 DIAGNOSIS — L97822 Non-pressure chronic ulcer of other part of left lower leg with fat layer exposed: Secondary | ICD-10-CM | POA: Diagnosis not present

## 2022-07-31 DIAGNOSIS — E11622 Type 2 diabetes mellitus with other skin ulcer: Secondary | ICD-10-CM | POA: Insufficient documentation

## 2022-07-31 DIAGNOSIS — I87333 Chronic venous hypertension (idiopathic) with ulcer and inflammation of bilateral lower extremity: Secondary | ICD-10-CM | POA: Diagnosis not present

## 2022-07-31 DIAGNOSIS — F17218 Nicotine dependence, cigarettes, with other nicotine-induced disorders: Secondary | ICD-10-CM | POA: Diagnosis not present

## 2022-08-01 NOTE — Progress Notes (Addendum)
[name], [name] ([id number]) [id number] Page 1 of 6 Visit Report for 07/31/2022 Chief Complaint Document Details Patient Name: Date of Service: [name], [name] 07/31/2022 12:00 PM Medical Record Number: [id number] Patient Account Number: [id number] Date of Birth/Sex: Treating RN: 1961/01/13 (62 y.o. [name] Primary Care Provider: Yves [name] Other Clinician: Referring Provider: Treating Provider/Extender: Luan Moore, Neelam Weeks in Treatment: 6 Information Obtained from: Patient Chief Complaint Bilateral LE Ulcers Electronic Signature(s) Signed: 07/31/2022 12:35:35 PM By: [name] Derry PA-C Entered By: [name] on 07/31/2022 12:35:35 -------------------------------------------------------------------------------- HPI Details Patient Name: Date of Service: [name], [name] 07/31/2022 12:00 PM Medical Record Number: [id number] Patient Account Number: [id number] Date of Birth/Sex: Treating RN: 08/04/1960 (62 y.o. [name] Primary Care Provider: Yves [name] Other Clinician: Referring Provider: Treating Provider/Extender: Luan Moore, Neelam Weeks in Treatment: 6 History of Present Illness HPI Description: 06-19-2022 upon evaluation today patient appears to be doing somewhat poorly in regard to a wound that began around Christmas time on the patient's ankle and lower extremities bilaterally. He tells me that he has been having quite significant pain at this point unfortunately. He did go to the emergency department on March 2. However this has been going on since around Christmas. On March 2 he had x-rays that were negative and I did review that today as well. The patient also has what appears to be chronic venous stasis which is an ongoing issue for him here as well and I think that is part of the issue. Right now it is just hard for him to wear anything compression wise although long-term I think he is going to need something. He does smoke  currently that something that he needs to stop. Patient does have a history of diabetes mellitus type 2 which is stated to be diet controlled. He also again is a current smoker which I think is something that he needs to get rid of completely. 06-26-2022 upon evaluation today patient appears to be doing well currently in regard to his wounds all things considered he tells me the pain is a little bit better he has been on the correct antibiotic for only 2 days so he is seeing some improvement already this is already a good sign. Fortunately there does not appear to be any signs of infection locally nor systemically which is great news. No fevers, chills, nausea, vomiting, or diarrhea. 07-03-2022 upon evaluation today patient appears to be doing better in regard to his leg the infection is clearing very well and I am actually much more pleased today with where things stand compared to where they were. Fortunately I do not see any signs of active infection locally or systemically at this time which is great news. No fevers, chills, nausea, vomiting, or diarrhea. 07-10-2022 upon evaluation today patient appears to be doing better I do not see any signs of infection I think he is much better in that regard. Fortunately I do not see any evidence of active infection locally or systemically which is great news. 07-17-2022 upon evaluation today patient actually appears to be doing better in regard to his wounds. He is showing some signs of improvement we actually now able to wrap him and I do think he is now allow me to do some debridement today as well which will also be good news. Fortunately I do not see any evidence of active infection locally nor systemically which is great news. 07-24-2022 upon evaluation patient actually appears to be making good progress  here and I am very pleased with where we stand. I do think that he is showing signs of improvement I think that the medication is doing a good job as far  as the new epithelial growth is concerned. He is in agreement with continuing with the current regimen the compression wrap has been beneficial the alginate did well but I do think we probably need to change this more than once a week. 07-31-2022 upon evaluation today patient appears to be doing well currently in regard to his wound. He has been tolerating the dressing changes on both legs without complication and overall I am extremely pleased actually with where we stand today. I do not see any signs of active infection Electronic Signature(s) Signed: 07/31/2022 12:47:10 PM By: [name] Derry PA-C Entered By: [name] on 07/31/2022 12:47:09 [name], [name] ([id number]) [id number] Page 2 of 6 -------------------------------------------------------------------------------- Physical Exam Details Patient Name: Date of Service: [name], [name] 07/31/2022 12:00 PM Medical Record Number: [id number] Patient Account Number: [id number] Date of Birth/Sex: Treating RN: 08-Jul-1960 (62 y.o. [name] Primary Care Provider: Yves [name] Other Clinician: Referring Provider: Treating Provider/Extender: Luan Moore, Neelam Weeks in Treatment: 6 Constitutional Well-nourished and well-hydrated in no acute distress. Respiratory normal breathing without difficulty. Psychiatric this patient is able to make decisions and demonstrates good insight into disease process. Alert and Oriented x 3. pleasant and cooperative. Notes Upon inspection patient's wound bed actually showed signs of good granulation epithelization at this point. Fortunately there does not appear to be any signs of infection locally nor systemically which is great news and overall I am extremely pleased with where we stand today. Electronic Signature(s) Signed: 07/31/2022 12:47:22 PM By: [name] Derry PA-C Entered By: [name] on 07/31/2022  12:47:22 -------------------------------------------------------------------------------- Physician Orders Details Patient Name: Date of Service: [name], [name] 07/31/2022 12:00 PM Medical Record Number: [id number] Patient Account Number: [id number] Date of Birth/Sex: Treating RN: Apr 23, 1960 (62 y.o. [name] Primary Care Provider: Yves [name] Other Clinician: Referring Provider: Treating Provider/Extender: Luan Moore, Neelam Weeks in Treatment: 6 Verbal / Phone Orders: No Diagnosis Coding ICD-10 Coding Code Description [phone] Chronic venous hypertension (idiopathic) with ulcer and inflammation of bilateral lower extremity L97.312 Non-pressure chronic ulcer of right ankle with fat layer exposed L97.822 Non-pressure chronic ulcer of other part of left lower leg with fat layer exposed F17.218 Nicotine dependence, cigarettes, with other nicotine-induced disorders E11.622 Type 2 diabetes mellitus with other skin ulcer Follow-up Appointments Return Appointment in 1 week. Bathing/ Shower/ Hygiene May shower; gently cleanse wound with antibacterial soap, rinse and pat dry prior to dressing wounds No tub bath. Edema Control - Lymphedema / Segmental Compressive Device / Other Bilateral Lower Extremities 3 Layer Compression System for Lymphedema. - Urgo 2 layer wrap used size regular Elevate, Exercise Daily and A void Standing for Long Periods of Time. Elevate legs to the level of the heart and pump ankles as often as possible Elevate leg(s) parallel to the floor when sitting. DO YOUR BEST to sleep in the bed at night. DO NOT sleep in your recliner. Long hours of sitting in a recliner leads to swelling of the legs and/or potential wounds on your backside. Medications-Please add to medication list. ntibiotics - continue taking cipro as prescribed P.O. A Wound Treatment Wound #1 - Lower Leg Wound Laterality: Left, Circumferential Cleanser: Byram Ancillary Kit - 15 Day Supply  (Generic) 1 x Per Week/30 Days Discharge Instructions: Use supplies as instructed; Kit contains: (15) Saline Bullets; (  15) 3x3 Gauze; 15 pr Gloves Cleanser: Soap and Water 1 x Per Week/30 Days Goodridge, Dick ([id number]) [id number] Page 3 of 6 Discharge Instructions: Gently cleanse wound with antibacterial soap, rinse and pat dry prior to dressing wounds Cleanser: Vashe 5.8 (oz) 1 x Per Week/30 Days Discharge Instructions: Use vashe 5.8 (oz) as directed Topical: Triamcinolone Acetonide Cream, 0.1%, 15 (g) tube 1 x Per Week/30 Days Discharge Instructions: Apply as directed by provider. TO LEG Prim Dressing: Silvercel 4 1/4x 4 1/4 (in/in) (Generic) 1 x Per Week/30 Days ary Discharge Instructions: Apply Silvercel 4 1/4x 4 1/4 (in/in) as instructed Secondary Dressing: Zetuvit Plus 4x4 (in/in) 1 x Per Week/30 Days Compression Wrap: Urgo K2 Lite, two layer compression system, regular 1 x Per Week/30 Days Wound #2 - Ankle Wound Laterality: Right, Lateral Cleanser: Byram Ancillary Kit - 15 Day Supply (Generic) 1 x Per Week/30 Days Discharge Instructions: Use supplies as instructed; Kit contains: (15) Saline Bullets; (15) 3x3 Gauze; 15 pr Gloves Cleanser: Soap and Water 1 x Per Week/30 Days Discharge Instructions: Gently cleanse wound with antibacterial soap, rinse and pat dry prior to dressing wounds Cleanser: Vashe 5.8 (oz) 1 x Per Week/30 Days Discharge Instructions: Use vashe 5.8 (oz) as directed Topical: Triamcinolone Acetonide Cream, 0.1%, 15 (g) tube 1 x Per Week/30 Days Discharge Instructions: Apply as directed by provider. TO LEG Prim Dressing: Silvercel 4 1/4x 4 1/4 (in/in) (Generic) 1 x Per Week/30 Days ary Discharge Instructions: Apply Silvercel 4 1/4x 4 1/4 (in/in) as instructed Secondary Dressing: Zetuvit Plus 4x4 (in/in) 1 x Per Week/30 Days Compression Wrap: Urgo K2 Lite, two layer compression system, regular 1 x Per Week/30 Days Electronic  Signature(s) Signed: 07/31/2022 4:17:31 PM By: [name] Derry PA-C Signed: 07/31/2022 4:58:56 PM By: Angelina Pih Entered By: Angelina Pih on 07/31/2022 12:58:07 -------------------------------------------------------------------------------- Problem List Details Patient Name: Date of Service: [name], [name] 07/31/2022 12:00 PM Medical Record Number: [id number] Patient Account Number: [id number] Date of Birth/Sex: Treating RN: July 26, 1960 (62 y.o. [name] Primary Care Provider: Yves [name] Other Clinician: Referring Provider: Treating Provider/Extender: Luan Moore, Neelam Weeks in Treatment: 6 Active Problems ICD-10 Encounter Code Description Active Date MDM Diagnosis I87.333 Chronic venous hypertension (idiopathic) with ulcer and inflammation of 06/19/2022 No Yes bilateral lower extremity L97.312 Non-pressure chronic ulcer of right ankle with fat layer exposed 06/19/2022 No Yes L97.822 Non-pressure chronic ulcer of other part of left lower leg with fat layer exposed3/21/2024 No Yes F17.218 Nicotine dependence, cigarettes, with other nicotine-induced disorders 06/19/2022 No Yes [name], [name] ([id number]) [id number] Page 4 of 6 E11.622 Type 2 diabetes mellitus with other skin ulcer 06/19/2022 No Yes Inactive Problems Resolved Problems Electronic Signature(s) Signed: 07/31/2022 12:35:30 PM By: [name] Derry PA-C Entered By: [name] on 07/31/2022 12:35:30 -------------------------------------------------------------------------------- Progress Note Details Patient Name: Date of Service: [name], [name] 07/31/2022 12:00 PM Medical Record Number: [id number] Patient Account Number: [id number] Date of Birth/Sex: Treating RN: 07-Mar-1961 (62 y.o. [name] Primary Care Provider: Yves [name] Other Clinician: Referring Provider: Treating Provider/Extender: Luan Moore, Neelam Weeks in Treatment: 6 Subjective Chief Complaint Information  obtained from Patient Bilateral LE Ulcers History of Present Illness (HPI) 06-19-2022 upon evaluation today patient appears to be doing somewhat poorly in regard to a wound that began around Christmas time on the patient's ankle and lower extremities bilaterally. He tells me that he has been having quite significant pain at this point unfortunately. He did go to the emergency department on March 2. However this has  been going on since around Christmas. On March 2 he had x-rays that were negative and I did review that today as well. The patient also has what appears to be chronic venous stasis which is an ongoing issue for him here as well and I think that is part of the issue. Right now it is just hard for him to wear anything compression wise although long-term I think he is going to need something. He does smoke currently that something that he needs to stop. Patient does have a history of diabetes mellitus type 2 which is stated to be diet controlled. He also again is a current smoker which I think is something that he needs to get rid of completely. 06-26-2022 upon evaluation today patient appears to be doing well currently in regard to his wounds all things considered he tells me the pain is a little bit better he has been on the correct antibiotic for only 2 days so he is seeing some improvement already this is already a good sign. Fortunately there does not appear to be any signs of infection locally nor systemically which is great news. No fevers, chills, nausea, vomiting, or diarrhea. 07-03-2022 upon evaluation today patient appears to be doing better in regard to his leg the infection is clearing very well and I am actually much more pleased today with where things stand compared to where they were. Fortunately I do not see any signs of active infection locally or systemically at this time which is great news. No fevers, chills, nausea, vomiting, or diarrhea. 07-10-2022 upon evaluation today  patient appears to be doing better I do not see any signs of infection I think he is much better in that regard. Fortunately I do not see any evidence of active infection locally or systemically which is great news. 07-17-2022 upon evaluation today patient actually appears to be doing better in regard to his wounds. He is showing some signs of improvement we actually now able to wrap him and I do think he is now allow me to do some debridement today as well which will also be good news. Fortunately I do not see any evidence of active infection locally nor systemically which is great news. 07-24-2022 upon evaluation patient actually appears to be making good progress here and I am very pleased with where we stand. I do think that he is showing signs of improvement I think that the medication is doing a good job as far as the new epithelial growth is concerned. He is in agreement with continuing with the current regimen the compression wrap has been beneficial the alginate did well but I do think we probably need to change this more than once a week. 07-31-2022 upon evaluation today patient appears to be doing well currently in regard to his wound. He has been tolerating the dressing changes on both legs without complication and overall I am extremely pleased actually with where we stand today. I do not see any signs of active infection Objective Constitutional Well-nourished and well-hydrated in no acute distress. Vitals Time Taken: 12:17 PM, Height: 70 in, Weight: 180 lbs, BMI: 25.8, Temperature: 98.1 F, Pulse: 106 bpm, Respiratory Rate: 18 breaths/min, Blood Pressure: 125/84 mmHg. Mccaslin, [name] ([id number]) [id number] Page 5 of 6 Respiratory normal breathing without difficulty. Psychiatric this patient is able to make decisions and demonstrates good insight into disease process. Alert and Oriented x 3. pleasant and cooperative. General Notes: Upon inspection patient's  wound bed actually showed signs of good  granulation epithelization at this point. Fortunately there does not appear to be any signs of infection locally nor systemically which is great news and overall I am extremely pleased with where we stand today. Integumentary (Hair, Skin) Wound #1 status is Open. Original cause of wound was Gradually Appeared. The date acquired was: 03/24/2022. The wound has been in treatment 6 weeks. The wound is located on the Left,Circumferential Lower Leg. The wound measures 5cm length x 12.8cm width x 0.1cm depth; 50.265cm^2 area and 5.027cm^3 volume. There is Fat Layer (Subcutaneous Tissue) exposed. There is no tunneling or undermining noted. There is a medium amount of serosanguineous drainage noted. There is medium (34-66%) red, pink granulation within the wound bed. There is a medium (34-66%) amount of necrotic tissue within the wound bed including Adherent Slough. Wound #2 status is Open. Original cause of wound was Gradually Appeared. The date acquired was: 03/24/2022. The wound has been in treatment 6 weeks. The wound is located on the Right,Lateral Ankle. The wound measures 2.6cm length x 0.7cm width x 0.2cm depth; 1.429cm^2 area and 0.286cm^3 volume. There is Fat Layer (Subcutaneous Tissue) exposed. There is no tunneling or undermining noted. There is a medium amount of serosanguineous drainage noted. There is large (67-100%) red, pink granulation within the wound bed. There is a small (1-33%) amount of necrotic tissue within the wound bed including Adherent Slough. 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 There was a Double Layer Compression Therapy Procedure by Angelina Pih, RN. Post procedure Diagnosis Wound #1: Same as Pre-Procedure Wound #2 Pre-procedure diagnosis of Wound #2 is a Venous Leg Ulcer located on the Right,Lateral Ankle . There was a Double Layer Compression Therapy Procedure by Angelina Pih, RN. Post procedure Diagnosis Wound #2: Same as Pre-Procedure Plan 1. Would recommend currently that the patient should continue with the compression wrapping he seems to be doing great in this regard. 2. I am also can recommend patient should continue to monitor for any signs of worsening overall obviously if anything changes he knows contact the office and let me know. Currently we will get a continue with the dressings as before. And specifically we have been using the Urgo K2 lite compression wraps along with the silver cell and Zetuvit which I think is doing an awesome job. We will see patient back for reevaluation in 1 week here in the clinic. If anything worsens or changes patient will contact our office for additional recommendations. Electronic Signature(s) Signed: 07/31/2022 12:48:42 PM By: [name] Derry PA-C Entered By: [name] on 07/31/2022 12:48:41 SuperBill Details -------------------------------------------------------------------------------- Alecia Lemming ([id number]) [id number] Page 6 of 6 Patient Name: Date of Service: [name], [name] 07/31/2022 Medical Record Number: [id number] Patient Account Number: [id number] Date of Birth/Sex: Treating RN: 1961/01/22 (62 y.o. [name] Primary Care Provider: Yves [name] Other Clinician: Referring Provider: Treating Provider/Extender: Luan Moore, Neelam Weeks in Treatment: 6 Diagnosis Coding ICD-10 Codes Code Description [phone] Chronic venous  hypertension (idiopathic) with ulcer and inflammation of bilateral lower extremity L97.312 Non-pressure chronic ulcer of right ankle with fat layer exposed L97.822 Non-pressure chronic ulcer of  other part of left lower leg with fat layer exposed F17.218 Nicotine dependence, cigarettes, with other nicotine-induced disorders E11.622 Type 2 diabetes mellitus with other skin ulcer Facility Procedures CPT4 Description Modifier Quantity Code [id number] [phone] BILATERAL: Application of multi-layer venous compression system; leg (below knee), including ankle and 1 foot. Physician Procedures Quantity CPT4 Code Description Modifier [phone] [id number] - WC PHYS LEVEL 3 - EST PT 1 ICD-10 Diagnosis Description I87.333 Chronic venous hypertension (idiopathic) with ulcer and inflammation of bilateral lower extremity L97.312 Non-pressure chronic ulcer of right ankle with fat layer exposed L97.822 Non-pressure chronic ulcer of other part of left lower leg with fat layer exposed F17.218 Nicotine dependence, cigarettes, with other nicotine-induced disorders Electronic Signature(s) Signed: 07/31/2022 1:46:15 PM By: Angelina Pih Signed: 07/31/2022 4:17:31 PM By: [name] Derry PA-C Previous Signature: 07/31/2022 12:49:11 PM Version By: [name] Derry PA-C Entered By: Angelina Pih on 07/31/2022 13:46:15

## 2022-08-01 NOTE — Progress Notes (Signed)
[name], [name] ([id number]) [id number] Page 1 of 10 Visit Report for 07/31/2022 Arrival Information Details Patient Name: Date of Service: [name], [name] 07/31/2022 12:00 PM Medical Record Number: [id number] Patient Account Number: [id number] Date of Birth/Sex: Treating RN: 22-Mar-1961 (62 y.o. [name] Primary Care Andrews Tener: Yves Dill Other Clinician: Referring Jailynne Opperman: Treating Velora Horstman/Extender: Luan Moore, Neelam Weeks in Treatment: 6 Visit Information History Since Last Visit Added or deleted any medications: No Patient Arrived: Ambulatory Any new allergies or adverse reactions: No Arrival Time: 12:17 Had a fall or experienced change in No Accompanied By: self activities of daily living that may affect Transfer Assistance: None risk of falls: Patient Identification Verified: Yes Hospitalized since last visit: No Secondary Verification Process Completed: Yes Has Dressing in Place as Prescribed: Yes Patient Requires Transmission-Based Precautions: No Has Compression in Place as Prescribed: Yes Patient Has Alerts: No Pain Present Now: No Electronic Signature(s) Signed: 07/31/2022 4:58:56 PM By: Angelina Pih Entered By: Angelina Pih on 07/31/2022 12:17:25 -------------------------------------------------------------------------------- Clinic Level of Care Assessment Details Patient Name: Date of Service: [name], [name] 07/31/2022 12:00 PM Medical Record Number: [id number] Patient Account Number: [id number] Date of Birth/Sex: Treating RN: Aug 02, 1960 (62 y.o. [name] Primary Care Haylen Bellotti: Yves Dill Other Clinician: Referring Dominyck Reser: Treating Ryn Peine/Extender: Luan Moore, Neelam Weeks in Treatment: 6 Clinic Level of Care Assessment Items TOOL 4 Quantity Score []  - 0 Use when only an EandM is performed on FOLLOW-UP visit ASSESSMENTS - Nursing Assessment / Reassessment []  - 0 Reassessment of Co-morbidities  (includes updates in patient status) []  - 0 Reassessment of Adherence to Treatment Plan ASSESSMENTS - Wound and Skin A ssessment / Reassessment []  - 0 Simple Wound Assessment / Reassessment - one wound []  - 0 Complex Wound Assessment / Reassessment - multiple wounds []  - 0 Dermatologic / Skin Assessment (not related to wound area) ASSESSMENTS - Focused Assessment []  - 0 Circumferential Edema Measurements - multi extremities []  - 0 Nutritional Assessment / Counseling / Intervention []  - 0 Lower Extremity Assessment (monofilament, tuning fork, pulses) []  - 0 Peripheral Arterial Disease Assessment (using hand held doppler) ASSESSMENTS - Ostomy and/or Continence Assessment and Care []  - 0 Incontinence Assessment and Management []  - 0 Ostomy Care Assessment and Management (repouching, etc.) PROCESS - Coordination of Care []  - 0 Simple Patient / Family Education for ongoing care []  - 0 Complex (extensive) Patient / Family Education for ongoing care [name], [name] ([id number]) [id number] Page 2 of 10 []  - 0 Staff obtains Consents, Records, T Results / Process Orders est []  - 0 Staff telephones HHA, Nursing Homes / Clarify orders / etc []  - 0 Routine Transfer to another Facility (non-emergent condition) []  - 0 Routine Hospital Admission (non-emergent condition) []  - 0 New Admissions / Manufacturing engineer / Ordering NPWT Apligraf, etc. , []  - 0 Emergency Hospital Admission (emergent condition) []  - 0 Simple Discharge Coordination []  - 0 Complex (extensive) Discharge Coordination PROCESS - Special Needs []  - 0 Pediatric / Minor Patient Management []  - 0 Isolation Patient Management []  - 0 Hearing / Language / Visual special needs []  - 0 Assessment of Community assistance (transportation, D/C planning, etc.) []  - 0 Additional assistance / Altered mentation []  - 0 Support Surface(s) Assessment (bed, cushion, seat, etc.) INTERVENTIONS - Wound  Cleansing / Measurement []  - 0 Simple Wound Cleansing - one wound []  - 0 Complex Wound Cleansing - multiple wounds []  - 0 Wound Imaging (photographs - any number of wounds) []  - 0  Wound Tracing (instead of photographs) []  - 0 Simple Wound Measurement - one wound []  - 0 Complex Wound Measurement - multiple wounds INTERVENTIONS - Wound Dressings []  - 0 Small Wound Dressing one or multiple wounds []  - 0 Medium Wound Dressing one or multiple wounds []  - 0 Large Wound Dressing one or multiple wounds []  - 0 Application of Medications - topical []  - 0 Application of Medications - injection INTERVENTIONS - Miscellaneous []  - 0 External ear exam []  - 0 Specimen Collection (cultures, biopsies, blood, body fluids, etc.) []  - 0 Specimen(s) / Culture(s) sent or taken to Lab for analysis []  - 0 Patient Transfer (multiple staff / Nurse, adult / Similar devices) []  - 0 Simple Staple / Suture removal (25 or less) []  - 0 Complex Staple / Suture removal (26 or more) []  - 0 Hypo / Hyperglycemic Management (close monitor of Blood Glucose) []  - 0 Ankle / Brachial Index (ABI) - do not check if billed separately []  - 0 Vital Signs Has the patient been seen at the hospital within the last three years: Yes Total Score: 0 Level Of Care: ____ Electronic Signature(s) Signed: 07/31/2022 4:58:56 PM By: Angelina Pih Entered By: Angelina Pih on 07/31/2022 13:46:02 [name], [name] ([id number]) [id number] Page 3 of 10 -------------------------------------------------------------------------------- Compression Therapy Details Patient Name: Date of Service: [name], [name] 07/31/2022 12:00 PM Medical Record Number: [id number] Patient Account Number: [id number] Date of Birth/Sex: Treating RN: Sep 14, 1960 (62 y.o. [name] Primary Care Ameah Chanda: Yves Dill Other Clinician: Referring Madeeha Costantino: Treating Shaurya Rawdon/Extender: Luan Moore, Neelam Weeks in Treatment:  6 Compression Therapy Performed for Wound Assessment: Wound #1 Left,Circumferential Lower Leg Performed By: Clinician Angelina Pih, RN Compression Type: Double Layer Post Procedure Diagnosis Same as Pre-procedure Electronic Signature(s) Signed: 07/31/2022 4:58:56 PM By: Angelina Pih Entered By: Angelina Pih on 07/31/2022 12:40:05 -------------------------------------------------------------------------------- Compression Therapy Details Patient Name: Date of Service: [name], [name] 07/31/2022 12:00 PM Medical Record Number: [id number] Patient Account Number: [id number] Date of Birth/Sex: Treating RN: [birth year] (62 y.o. [name] Primary Care Julieana Eshleman: Yves Dill Other Clinician: Referring Easton Fetty: Treating Milca Sytsma/Extender: Luan Moore, Neelam Weeks in Treatment: 6 Compression Therapy Performed for Wound Assessment: Wound #2 Right,Lateral Ankle Performed By: Clinician Angelina Pih, RN Compression Type: Double Layer Post Procedure Diagnosis Same as Pre-procedure Electronic Signature(s) Signed: 07/31/2022 4:58:56 PM By: Angelina Pih Entered By: Angelina Pih on 07/31/2022 12:40:20 -------------------------------------------------------------------------------- Encounter Discharge Information Details Patient Name: Date of Service: [name], [name] 07/31/2022 12:00 PM Medical Record Number: [id number] Patient Account Number: [id number] Date of Birth/Sex: Treating RN: 1960-11-15 (62 y.o. [name] Primary Care Zamari Vea: Yves Dill Other Clinician: Referring Alcee Sipos: Treating Samadhi Mahurin/Extender: Luan Moore, Neelam Weeks in Treatment: 6 Encounter Discharge Information Items Discharge Condition: Stable Ambulatory Status: Ambulatory Discharge Destination: Home Transportation: Private Auto Accompanied By: self Schedule Follow-up Appointment: Yes Clinical Summary of Care: Electronic Signature(s) Signed: 07/31/2022 1:47:29 PM By: Angelina Pih Entered By: Angelina Pih on 07/31/2022 13:47:29 [name], [name] ([id number]) [id number] Page 4 of 10 -------------------------------------------------------------------------------- Lower Extremity Assessment Details Patient Name: Date of Service: [name], [name] 07/31/2022 12:00 PM Medical Record Number: [id number] Patient Account Number: [id number] Date of Birth/Sex: Treating RN: 06/22/1960 (62 y.o. [name] Primary Care Krysteena Stalker: Yves Dill Other Clinician: Referring Sheddrick Lattanzio: Treating Meshell Abdulaziz/Extender: Luan Moore, Neelam Weeks in Treatment: 6 Edema Assessment Assessed: [Left: No] [Right: No] [Left: Edema] [Right: :] Calf Left: Right: Point of Measurement: 33 cm From Medial Instep 32.5 cm 35 cm  Ankle Left: Right: Point of Measurement: 12 cm From Medial Instep 22 cm 22.1 cm Vascular Assessment Pulses: Dorsalis Pedis Palpable: [Left:Yes] [Right:Yes] Posterior Tibial Palpable: [Left:Yes] [Right:Yes] Electronic Signature(s) Signed: 07/31/2022 4:58:56 PM By: Angelina Pih Entered By: Angelina Pih on 07/31/2022 12:36:09 -------------------------------------------------------------------------------- Multi Wound Chart Details Patient Name: Date of Service: [name], [name] 07/31/2022 12:00 PM Medical Record Number: [id number] Patient Account Number: [id number] Date of Birth/Sex: Treating RN: September 09, 1960 (61 y.o. [name] Primary Care Sung Renton: Yves Dill Other Clinician: Referring Deneshia Zucker: Treating Forest Pruden/Extender: Luan Moore, Neelam Weeks in Treatment: 6 Vital Signs Height(in): 70 Pulse(bpm): 106 Weight(lbs): 180 Blood Pressure(mmHg): 125/84 Body Mass Index(BMI): 25.8 Temperature(F): 98.1 Respiratory Rate(breaths/min): 18 [1:Photos:] [N/A:N/A [id number] Page 5 of 10] Left, Circumferential Lower Leg Right, Lateral Ankle N/A Wound Location: Gradually Appeared Gradually Appeared  N/A Wounding Event: Venous Leg Ulcer Venous Leg Ulcer N/A Primary Etiology: Rheumatoid Arthritis Rheumatoid Arthritis N/A Comorbid History: 03/24/2022 03/24/2022 N/A Date Acquired: 6 6 N/A Weeks of Treatment: Open Open N/A Wound Status: No No N/A Wound Recurrence: 5x12.8x0.1 2.6x0.7x0.2 N/A Measurements L x W x D (cm) 50.265 1.429 N/A A (cm) : rea 5.027 0.286 N/A Volume (cm) : 46.20% 60.60% N/A % Reduction in A rea: 46.20% 60.60% N/A % Reduction in Volume: Full Thickness Without Exposed Full Thickness Without Exposed N/A Classification: Support Structures Support Structures Medium Medium N/A Exudate A mount: Serosanguineous Serosanguineous N/A Exudate Type: red, brown red, brown N/A Exudate Color: Medium (34-66%) Large (67-100%) N/A Granulation A mount: Red, Pink Red, Pink N/A Granulation Quality: Medium (34-66%) Small (1-33%) N/A Necrotic A mount: Fat Layer (Subcutaneous Tissue): Yes Fat Layer (Subcutaneous Tissue): Yes N/A Exposed Structures: None None N/A Epithelialization: Treatment Notes Electronic Signature(s) Signed: 07/31/2022 4:58:56 PM By: Angelina Pih Entered By: Angelina Pih on 07/31/2022 12:38:47 -------------------------------------------------------------------------------- Multi-Disciplinary Care Plan Details Patient Name: Date of Service: [name], [name] 07/31/2022 12:00 PM Medical Record Number: [id number] Patient Account Number: [id number] Date of Birth/Sex: Treating RN: 1960/08/27 (62 y.o. [name] Primary Care Pandora Mccrackin: Yves Dill Other Clinician: Referring Marley Charlot: Treating Daniyla Pfahler/Extender: Luan Moore, Neelam Weeks in Treatment: 6 Active Inactive Venous Leg Ulcer Nursing Diagnoses: Actual venous Insuffiency (use after diagnosis is confirmed) Goals: Patient will maintain optimal edema control Date Initiated: 07/10/2022 Target Resolution Date: 08/05/2022 Goal Status: Active Patient/caregiver will verbalize  understanding of disease process and disease management Date Initiated: 07/10/2022 Target Resolution Date: 08/05/2022 Goal Status: Active Verify adequate tissue perfusion prior to therapeutic compression application Date Initiated: 07/10/2022 Date Inactivated: 07/10/2022 Target Resolution Date: 07/10/2022 Goal Status: Met Interventions: Assess peripheral edema status every visit. Compression as ordered Provide education on venous insufficiency Treatment Activities: Therapeutic compression applied : 07/10/2022 Notes: Wound/Skin Impairment Nursing Diagnoses: [name], [name] ([id number]) [phone].pdf Page 6 of 10 Impaired tissue integrity Knowledge deficit related to ulceration/compromised skin integrity Goals: Ulcer/skin breakdown will have a volume reduction of 30% by week 4 Date Initiated: 06/19/2022 Date Inactivated: 07/17/2022 Target Resolution Date: 07/17/2022 Goal Status: Met Ulcer/skin breakdown will have a volume reduction of 50% by week 8 Date Initiated: 06/19/2022 Target Resolution Date: 08/14/2022 Goal Status: Active Ulcer/skin breakdown will have a volume reduction of 80% by week 12 Date Initiated: 06/19/2022 Target Resolution Date: 09/11/2022 Goal Status: Active Ulcer/skin breakdown will heal within 14 weeks Date Initiated: 06/19/2022 Target Resolution Date: 09/25/2022 Goal Status: Active Interventions: Assess patient/caregiver ability to obtain necessary supplies Assess patient/caregiver ability to perform ulcer/skin care regimen upon admission and as needed Assess ulceration(s) every visit Provide education on ulcer  and skin care Treatment Activities: Referred to DME Zakir Henner for dressing supplies : 06/19/2022 Skin care regimen initiated : 06/19/2022 Notes: Electronic Signature(s) Signed: 07/31/2022 1:46:26 PM By: Angelina Pih Entered By: Angelina Pih on 07/31/2022  13:46:26 -------------------------------------------------------------------------------- Pain Assessment Details Patient Name: Date of Service: [name], [name] 07/31/2022 12:00 PM Medical Record Number: [id number] Patient Account Number: [id number] Date of Birth/Sex: Treating RN: [birth year] (62 y.o. [name] Primary Care Klark Vanderhoef: Yves Dill Other Clinician: Referring Deola Rewis: Treating Cassandra Harbold/Extender: Luan Moore, Neelam Weeks in Treatment: 6 Active Problems Location of Pain Severity and Description of Pain Patient Has Paino No Site Locations Rate the pain. Current Pain Level: 0 Pain Management and Medication Current Pain Management: Electronic Signature(s) Signed: 07/31/2022 4:58:56 PM By: Deboraha Sprang [phone] PM By: Angelina Pih [phone].pdf Page 7 of 10 Signed: 07/31/2022 Entered By: Angelina Pih on 07/31/2022 12:19:33 -------------------------------------------------------------------------------- Patient/Caregiver Education Details Patient Name: Date of Service: [name], [name] 5/2/2024andnbsp12:00 PM Medical Record Number: [id number] Patient Account Number: [id number] Date of Birth/Gender: Treating RN: 10/27/1960 (62 y.o. [name] Primary Care Physician: Yves Dill Other Clinician: Referring Physician: Treating Physician/Extender: Leslee Home in Treatment: 6 Education Assessment Education Provided To: Patient Education Topics Provided Venous: Handouts: Controlling Swelling with Multilayered Compression Wraps Methods: Explain/Verbal Responses: State content correctly Wound/Skin Impairment: Handouts: Caring for Your Ulcer Methods: Explain/Verbal Responses: State content correctly Electronic Signature(s) Signed: 07/31/2022 4:58:56 PM By: Angelina Pih Entered By: Angelina Pih on 07/31/2022  13:46:52 -------------------------------------------------------------------------------- Wound Assessment Details Patient Name: Date of Service: [name], [name] 07/31/2022 12:00 PM Medical Record Number: [id number] Patient Account Number: [id number] Date of Birth/Sex: Treating RN: Aug 01, 1960 (62 y.o. [name] Primary Care Bufford Helms: Yves Dill Other Clinician: Referring Sarae Nicholes: Treating Lanie Schelling/Extender: Luan Moore, Neelam Weeks in Treatment: 6 Wound Status Wound Number: 1 Primary Etiology: Venous Leg Ulcer Wound Location: Left, Circumferential Lower Leg Wound Status: Open Wounding Event: Gradually Appeared Comorbid History: Rheumatoid Arthritis Date Acquired: 03/24/2022 Weeks Of Treatment: 6 Clustered Wound: No Photos Wound Measurements Length: (cm) 5 [name], [name] ([id number]) Width: (cm) 12. Depth: (cm) 0.1 Area: (cm) 50 Volume: (cm) 5. % Reduction in Area: 46.2% [phone].pdf Page 8 of 10 8 % Reduction in Volume: 46.2% Epithelialization: None .265 Tunneling: No 027 Undermining: No Wound Description Classification: Full Thickness Without Exposed Support Structures Exudate Amount: Medium Exudate Type: Serosanguineous Exudate Color: red, brown Foul Odor After Cleansing: No Slough/Fibrino Yes Wound Bed Granulation Amount: Medium (34-66%) Exposed Structure Granulation Quality: Red, Pink Fat Layer (Subcutaneous Tissue) Exposed: Yes Necrotic Amount: Medium (34-66%) Necrotic Quality: Adherent Slough Treatment Notes Wound #1 (Lower Leg) Wound Laterality: Left, Circumferential Cleanser Byram Ancillary Kit - 15 Day Supply Discharge Instruction: Use supplies as instructed; Kit contains: (15) Saline Bullets; (15) 3x3 Gauze; 15 pr Gloves Soap and Water Discharge Instruction: Gently cleanse wound with antibacterial soap, rinse and pat dry prior to dressing wounds Vashe 5.8 (oz) Discharge Instruction: Use vashe 5.8 (oz) as  directed Peri-Wound Care Topical Triamcinolone Acetonide Cream, 0.1%, 15 (g) tube Discharge Instruction: Apply as directed by Shuronda Santino. TO LEG Primary Dressing Silvercel 4 1/4x 4 1/4 (in/in) Discharge Instruction: Apply Silvercel 4 1/4x 4 1/4 (in/in) as instructed Secondary Dressing Zetuvit Plus 4x4 (in/in) Secured With Compression Wrap Urgo K2 Lite, two layer compression system, regular Compression Stockings Add-Ons Electronic Signature(s) Signed: 07/31/2022 4:58:56 PM By: Angelina Pih Entered By: Angelina Pih on 07/31/2022 12:35:18 -------------------------------------------------------------------------------- Wound Assessment Details Patient Name: Date of Service: [name], [name]  07/31/2022 12:00 PM Medical Record Number: [id number] Patient Account Number: [id number] Date of Birth/Sex: Treating RN: 03-02-1961 (62 y.o. [name] Primary Care Bronsen Serano: Yves Dill Other Clinician: Referring Skye Plamondon: Treating Shemeca Lukasik/Extender: Luan Moore, Neelam Weeks in Treatment: 6 Wound Status Wound Number: 2 Primary Etiology: Venous Leg Ulcer Wound Location: Right, Lateral Ankle Wound Status: Open Wounding Event: Gradually Appeared Comorbid History: Rheumatoid Arthritis Date Acquired: 03/24/2022 Weeks Of Treatment: 6 Clustered Wound: No [name], [name] ([id number]) [phone].pdf Page 9 of 10 Photos Wound Measurements Length: (cm) 2.6 Width: (cm) 0.7 Depth: (cm) 0.2 Area: (cm) 1.429 Volume: (cm) 0.286 % Reduction in Area: 60.6% % Reduction in Volume: 60.6% Epithelialization: None Tunneling: No Undermining: No Wound Description Classification: Full Thickness Without Exposed Suppor Exudate Amount: Medium Exudate Type: Serosanguineous Exudate Color: red, brown t Structures Foul Odor After Cleansing: No Slough/Fibrino Yes Wound Bed Granulation Amount: Large (67-100%) Exposed Structure Granulation Quality: Red, Pink Fat Layer  (Subcutaneous Tissue) Exposed: Yes Necrotic Amount: Small (1-33%) Necrotic Quality: Adherent Slough Treatment Notes Wound #2 (Ankle) Wound Laterality: Right, Lateral Cleanser Byram Ancillary Kit - 15 Day Supply Discharge Instruction: Use supplies as instructed; Kit contains: (15) Saline Bullets; (15) 3x3 Gauze; 15 pr Gloves Soap and Water Discharge Instruction: Gently cleanse wound with antibacterial soap, rinse and pat dry prior to dressing wounds Vashe 5.8 (oz) Discharge Instruction: Use vashe 5.8 (oz) as directed Peri-Wound Care Topical Triamcinolone Acetonide Cream, 0.1%, 15 (g) tube Discharge Instruction: Apply as directed by Triton Heidrich. TO LEG Primary Dressing Silvercel 4 1/4x 4 1/4 (in/in) Discharge Instruction: Apply Silvercel 4 1/4x 4 1/4 (in/in) as instructed Secondary Dressing Zetuvit Plus 4x4 (in/in) Secured With Compression Wrap Urgo K2 Lite, two layer compression system, regular Compression Stockings Add-Ons Electronic Signature(s) Signed: 07/31/2022 4:58:56 PM By: Angelina Pih Entered By: Angelina Pih on 07/31/2022 12:35:44 [name], [name] ([id number]) [id number] Page 10 of 10 -------------------------------------------------------------------------------- Vitals Details Patient Name: Date of Service: [name], [name] 07/31/2022 12:00 PM Medical Record Number: [id number] Patient Account Number: [id number] Date of Birth/Sex: Treating RN: 07/13/1960 (62 y.o. [name] Primary Care Rishaan Gunner: Yves Dill Other Clinician: Referring Jasaiah Karwowski: Treating Rayyan Burley/Extender: Luan Moore, Neelam Weeks in Treatment: 6 Vital Signs Time Taken: 12:17 Temperature (F): 98.1 Height (in): 70 Pulse (bpm): 106 Weight (lbs): 180 Respiratory Rate (breaths/min): 18 Body Mass Index (BMI): 25.8 Blood Pressure (mmHg): 125/84 Reference Range: 80 - 120 mg / dl Electronic Signature(s) Signed: 07/31/2022 4:58:56 PM By: Angelina Pih Entered By:  Angelina Pih on 07/31/2022 12:19:24

## 2022-08-04 DIAGNOSIS — F17218 Nicotine dependence, cigarettes, with other nicotine-induced disorders: Secondary | ICD-10-CM | POA: Diagnosis not present

## 2022-08-04 DIAGNOSIS — I87333 Chronic venous hypertension (idiopathic) with ulcer and inflammation of bilateral lower extremity: Secondary | ICD-10-CM | POA: Diagnosis not present

## 2022-08-04 DIAGNOSIS — L97822 Non-pressure chronic ulcer of other part of left lower leg with fat layer exposed: Secondary | ICD-10-CM | POA: Diagnosis not present

## 2022-08-04 DIAGNOSIS — E11622 Type 2 diabetes mellitus with other skin ulcer: Secondary | ICD-10-CM | POA: Diagnosis not present

## 2022-08-04 DIAGNOSIS — L97312 Non-pressure chronic ulcer of right ankle with fat layer exposed: Secondary | ICD-10-CM | POA: Diagnosis not present

## 2022-08-05 NOTE — Progress Notes (Signed)
Tait, [name] ([id number]) [id number] Page 1 of 2 Visit Report for 08/04/2022 Physician Orders Details Patient Name: Date of Service: [name], [name] 08/04/2022 11:30 A M Medical Record Number: [id number] Patient Account Number: [id number] Date of Birth/Sex: Treating RN: 1960-12-15 (61 y.o. Melonie [place] Primary Care Provider: Yves Dill Other Clinician: Betha Loa Referring Provider: Treating Provider/Extender: Luan Moore, Neelam Weeks in Treatment: 6 Verbal / Phone Orders: No Diagnosis Coding Follow-up Appointments Return Appointment in 1 week. Bathing/ Shower/ Hygiene May shower; gently cleanse wound with antibacterial soap, rinse and pat dry prior to dressing wounds No tub bath. Edema Control - Lymphedema / Segmental Compressive Device / Other Bilateral Lower Extremities 3 Layer Compression System for Lymphedema. - Urgo 2 layer wrap used size regular Elevate, Exercise Daily and A void Standing for Long Periods of Time. Elevate legs to the level of the heart and pump ankles as often as possible Elevate leg(s) parallel to the floor when sitting. DO YOUR BEST to sleep in the bed at night. DO NOT sleep in your recliner. Long hours of sitting in a recliner leads to swelling of the legs and/or potential wounds on your backside. Wound Treatment Wound #1 - Lower Leg Wound Laterality: Left, Circumferential Cleanser: Byram Ancillary Kit - 15 Day Supply (Generic) 1 x Per Week/30 Days Discharge Instructions: Use supplies as instructed; Kit contains: (15) Saline Bullets; (15) 3x3 Gauze; 15 pr Gloves Cleanser: Soap and Water 1 x Per Week/30 Days Discharge Instructions: Gently cleanse wound with antibacterial soap, rinse and pat dry prior to dressing wounds Cleanser: Vashe 5.8 (oz) 1 x Per Week/30 Days Discharge Instructions: Use vashe 5.8 (oz) as directed Topical: Triamcinolone Acetonide Cream, 0.1%, 15 (g) tube 1 x Per Week/30 Days Discharge Instructions:  Apply as directed by provider. TO LEG Prim Dressing: Silvercel 4 1/4x 4 1/4 (in/in) (Generic) 1 x Per Week/30 Days ary Discharge Instructions: Apply Silvercel 4 1/4x 4 1/4 (in/in) as instructed Secondary Dressing: Zetuvit Plus 4x4 (in/in) 1 x Per Week/30 Days Compression Wrap: Urgo K2 Lite, two layer compression system, regular 1 x Per Week/30 Days Wound #2 - Ankle Wound Laterality: Right, Lateral Cleanser: Byram Ancillary Kit - 15 Day Supply (Generic) 1 x Per Week/30 Days Discharge Instructions: Use supplies as instructed; Kit contains: (15) Saline Bullets; (15) 3x3 Gauze; 15 pr Gloves Cleanser: Soap and Water 1 x Per Week/30 Days Discharge Instructions: Gently cleanse wound with antibacterial soap, rinse and pat dry prior to dressing wounds Cleanser: Vashe 5.8 (oz) 1 x Per Week/30 Days Discharge Instructions: Use vashe 5.8 (oz) as directed Topical: Triamcinolone Acetonide Cream, 0.1%, 15 (g) tube 1 x Per Week/30 Days Discharge Instructions: Apply as directed by provider. TO LEG Prim Dressing: Silvercel 4 1/4x 4 1/4 (in/in) (Generic) 1 x Per Week/30 Days ary Discharge Instructions: Apply Silvercel 4 1/4x 4 1/4 (in/in) as instructed Secondary Dressing: Zetuvit Plus 4x4 (in/in) 1 x Per Week/30 Days Compression Wrap: Urgo K2 Lite, two layer compression system, regular 1 x Per Week/30 Days Electronic Signature(s) Desautel, Ricki ([id number]) [id number] Page 2 of 2 Signed: 08/04/2022 6:09:56 PM By: Allen Derry PA-C Signed: 08/05/2022 8:17:18 AM By: Betha Loa Entered By: Betha Loa on 08/04/2022 12:55:21 -------------------------------------------------------------------------------- SuperBill Details Patient Name: Date of Service: [name], [name] 08/04/2022 Medical Record Number: [id number] Patient Account Number: [id number] Date of Birth/Sex: Treating RN: [birth year] (61 y.o. 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 Modifier: Quantity: 1 Electronic Signature(s) Signed: 08/04/2022 6:09:56 PM By: Allen Derry PA-C Signed: 08/05/2022 8:17:18 AM By: Betha Loa Entered By: Betha Loa on 08/04/2022 13:15:03

## 2022-08-06 NOTE — Progress Notes (Signed)
Mckneely, [name] ([id number]) [id number] Page 1 of 5 Visit Report for 08/04/2022 Arrival Information Details Patient Name: Date of Service: [name], [name] 08/04/2022 11:30 A M Medical Record Number: [id number] Patient Account Number: [id number] Date of Birth/Sex: Treating RN: [birth year] (61 y.o. Judie Petit) Yevonne Pax Primary Care Catalena Stanhope: Yves Dill Other Clinician: Betha Loa Referring Tavi Hoogendoorn: Treating Trenia Tennyson/Extender: Luan Moore, Neelam Weeks in Treatment: 6 Visit Information History Since Last Visit All ordered tests and consults were completed: No Patient Arrived: Ambulatory Added or deleted any medications: No Arrival Time: 12:50 Any new allergies or adverse reactions: No Transfer Assistance: None Had a fall or experienced change in No Patient Identification Verified: Yes activities of daily living that may affect Secondary Verification Process Completed: Yes risk of falls: Patient Requires Transmission-Based Precautions: No Signs or symptoms of abuse/neglect since last visito No Patient Has Alerts: No Hospitalized since last visit: No Has Dressing in Place as Prescribed: Yes Has Compression in Place as Prescribed: Yes Pain Present Now: No Electronic Signature(s) Signed: 08/05/2022 8:17:18 AM By: Betha Loa Entered By: Betha Loa on 08/04/2022 12:53:44 -------------------------------------------------------------------------------- Clinic Level of Care Assessment Details Patient Name: Date of Service: [name], [name] 08/04/2022 11:30 A M Medical Record Number: [id number] Patient Account Number: [id number] Date of Birth/Sex: Treating RN: 06/20/1960 (61 y.o. Melonie [place] Primary Care Massiah Longanecker: Yves Dill Other Clinician: Betha Loa Referring Sohail Capraro: Treating Tawana Pasch/Extender: Luan Moore, Neelam Weeks in Treatment: 6 Clinic Level of Care Assessment Items TOOL 1 Quantity Score []  - 0 Use when EandM and Procedure is performed  on INITIAL visit ASSESSMENTS - Nursing Assessment / Reassessment []  - 0 General Physical Exam (combine w/ comprehensive assessment (listed just below) when performed on new pt. evals) []  - 0 Comprehensive Assessment (HX, ROS, Risk Assessments, Wounds Hx, etc.) ASSESSMENTS - Wound and Skin Assessment / Reassessment []  - 0 Dermatologic / Skin Assessment (not related to wound area) ASSESSMENTS - Ostomy and/or Continence Assessment and Care []  - 0 Incontinence Assessment and Management []  - 0 Ostomy Care Assessment and Management (repouching, etc.) PROCESS - Coordination of Care []  - 0 Simple Patient / Family Education for ongoing care []  - 0 Complex (extensive) Patient / Family Education for ongoing care []  - 0 Staff obtains Chiropractor, Records, T Results / Process Orders est []  - 0 Staff telephones HHA, Nursing Homes / Clarify orders / etc []  - 0 Routine Transfer to another Facility (non-emergent condition) []  - 0 Routine Hospital Admission (non-emergent condition) []  - 0 New Admissions / Insurance Authorizations / Ordering NPWT Apligraf, etc. , Langenbach, Jayon ([id number]) [id number] Page 2 of 5 []  - 0 Emergency Hospital Admission (emergent condition) PROCESS - Special Needs []  - 0 Pediatric / Minor Patient Management []  - 0 Isolation Patient Management []  - 0 Hearing / Language / Visual special needs []  - 0 Assessment of Community assistance (transportation, D/C planning, etc.) []  - 0 Additional assistance / Altered mentation []  - 0 Support Surface(s) Assessment (bed, cushion, seat, etc.) INTERVENTIONS - Miscellaneous []  - 0 External ear exam []  - 0 Patient Transfer (multiple staff / Nurse, adult / Similar devices) []  - 0 Simple Staple / Suture removal (25 or less) []  - 0 Complex Staple / Suture removal (26 or more) []  - 0 Hypo/Hyperglycemic Management (do not check if billed separately) []  - 0 Ankle / Brachial Index (ABI) - do not check if  billed separately Has the patient been seen at the hospital within the last three years: Yes Total Score:  0 Level Of Care: ____ Electronic Signature(s) Signed: 08/05/2022 8:17:18 AM By: Betha Loa Entered By: Betha Loa on 08/04/2022 13:14:50 -------------------------------------------------------------------------------- Compression Therapy Details Patient Name: Date of Service: [name], [name] 08/04/2022 11:30 A M Medical Record Number: [id number] Patient Account Number: [id number] Date of Birth/Sex: Treating RN: 1960/08/30 (61 y.o. Judie Petit) Yevonne Pax Primary Care Sheridan Hew: Yves Dill Other Clinician: Betha Loa Referring Mackynzie Woolford: Treating Ginnette Gates/Extender: Luan Moore, Neelam Weeks in Treatment: 6 Compression Therapy Performed for Wound Assessment: Wound #1 Left,Circumferential Lower Leg Performed By: Farrel Gordon, Angie, Compression Type: Double Layer Pre Treatment ABI: 1.4 Electronic Signature(s) Signed: 08/05/2022 8:17:18 AM By: Betha Loa Entered By: Betha Loa on 08/04/2022 12:54:37 -------------------------------------------------------------------------------- Compression Therapy Details Patient Name: Date of Service: [name], [name] 08/04/2022 11:30 A M Medical Record Number: [id number] Patient Account Number: [id number] Date of Birth/Sex: Treating RN: [birth year] (61 y.o. Melonie [place] Primary Care Briona Korpela: Yves Dill Other Clinician: Betha Loa Referring Kalynn Declercq: Treating Anjali Manzella/Extender: Luan Moore, Neelam Weeks in Treatment: 6 Compression Therapy Performed for Wound Assessment: Wound #2 Right,Lateral Ankle Performed By: Farrel Gordon, Angie, Compression Type: Double Layer Pre Treatment ABI: 1.3 Electronic Signature(s) Signed: 08/05/2022 8:17:18 AM By: Betha Loa Entered By: Betha Loa on 08/04/2022 12:54:52 Hallum, Johncarlo ([id number]) [id number] Page 3 of  5 -------------------------------------------------------------------------------- Encounter Discharge Information Details Patient Name: Date of Service: [name], [name] 08/04/2022 11:30 A M Medical Record Number: [id number] Patient Account Number: [id number] Date of Birth/Sex: Treating RN: 1961/03/20 (61 y.o. Judie Petit) Yevonne Pax Primary Care Breeley Bischof: Yves Dill Other Clinician: Betha Loa Referring Taylar Hartsough: Treating Stace Peace/Extender: Luan Moore, Neelam Weeks in Treatment: 6 Encounter Discharge Information Items Discharge Condition: Stable Ambulatory Status: Ambulatory Discharge Destination: Home Transportation: Private Auto Accompanied By: self Schedule Follow-up Appointment: Yes Clinical Summary of Care: Electronic Signature(s) Signed: 08/05/2022 8:17:18 AM By: Betha Loa Entered By: Betha Loa on 08/04/2022 13:14:45 -------------------------------------------------------------------------------- Wound Assessment Details Patient Name: Date of Service: [name], [name] 08/04/2022 11:30 A M Medical Record Number: [id number] Patient Account Number: [id number] Date of Birth/Sex: Treating RN: [birth year] (61 y.o. Judie Petit) Yevonne Pax Primary Care Marabelle Cushman: Yves Dill Other Clinician: Betha Loa Referring Emberlyn Burlison: Treating Vondell Sowell/Extender: Luan Moore, Neelam Weeks in Treatment: 6 Wound Status Wound Number: 1 Primary Etiology: Venous Leg Ulcer Wound Location: Left, Circumferential Lower Leg Wound Status: Open Wounding Event: Gradually Appeared Comorbid History: Rheumatoid Arthritis Date Acquired: 03/24/2022 Weeks Of Treatment: 6 Clustered Wound: No Wound Measurements Length: (cm) 5 Width: (cm) 12.8 Depth: (cm) 0.1 Area: (cm) 50.265 Volume: (cm) 5.027 % Reduction in Area: 46.2% % Reduction in Volume: 46.2% Epithelialization: None Wound Description Classification: Full Thickness Without Exposed Suppor Exudate Amount: Medium Exudate Type:  Serosanguineous Exudate Color: red, brown t Structures Foul Odor After Cleansing: No Slough/Fibrino Yes Wound Bed Granulation Amount: Medium (34-66%) Exposed Structure Granulation Quality: Red, Pink Fat Layer (Subcutaneous Tissue) Exposed: Yes Necrotic Amount: Medium (34-66%) Necrotic Quality: Adherent Slough Treatment Notes Wound #1 (Lower Leg) Wound Laterality: Left, Circumferential Cleanser Byram Ancillary Kit - 15 Day Supply Discharge Instruction: Use supplies as instructed; Kit contains: (15) Saline Bullets; (15) 3x3 Gauze; 15 pr Gloves Soap and Water Stgermain, Alven ([id number]) [id number] Page 4 of 5 Discharge Instruction: Gently cleanse wound with antibacterial soap, rinse and pat dry prior to dressing wounds Vashe 5.8 (oz) Discharge Instruction: Use vashe 5.8 (oz) as directed Peri-Wound Care Topical Triamcinolone Acetonide Cream, 0.1%, 15 (g) tube Discharge Instruction: Apply as directed by Money Mckeithan. TO LEG Primary Dressing Silvercel 4 1/4x 4 1/4 (  in/in) Discharge Instruction: Apply Silvercel 4 1/4x 4 1/4 (in/in) as instructed Secondary Dressing Zetuvit Plus 4x4 (in/in) Secured With Compression Wrap Urgo K2 Lite, two layer compression system, regular Compression Stockings Add-Ons Electronic Signature(s) Signed: 08/05/2022 8:17:18 AM By: Betha Loa Signed: 08/06/2022 9:53:20 AM By: Yevonne Pax RN Entered By: Betha Loa on 08/04/2022 12:54:08 -------------------------------------------------------------------------------- Wound Assessment Details Patient Name: Date of Service: [name], [name] 08/04/2022 11:30 A M Medical Record Number: [id number] Patient Account Number: [id number] Date of Birth/Sex: Treating RN: July 14, 1960 (61 y.o. Melonie [place] Primary Care Tuck Dulworth: Yves Dill Other Clinician: Betha Loa Referring Yazeed Pryer: Treating Araiyah Cumpton/Extender: Luan Moore, Neelam Weeks in Treatment: 6 Wound Status Wound Number:  2 Primary Etiology: Venous Leg Ulcer Wound Location: Right, Lateral Ankle Wound Status: Open Wounding Event: Gradually Appeared Comorbid History: Rheumatoid Arthritis Date Acquired: 03/24/2022 Weeks Of Treatment: 6 Clustered Wound: No Wound Measurements Length: (cm) 2.6 Width: (cm) 0.7 Depth: (cm) 0.2 Area: (cm) 1.429 Volume: (cm) 0.286 % Reduction in Area: 60.6% % Reduction in Volume: 60.6% Epithelialization: None Wound Description Classification: Full Thickness Without Exposed Suppor Exudate Amount: Medium Exudate Type: Serosanguineous Exudate Color: red, brown t Structures Foul Odor After Cleansing: No Slough/Fibrino Yes Wound Bed Granulation Amount: Large (67-100%) Exposed Structure Granulation Quality: Red, Pink Fat Layer (Subcutaneous Tissue) Exposed: Yes Necrotic Amount: Small (1-33%) Necrotic Quality: Adherent Slough Treatment Notes Wound #2 (Ankle) Wound Laterality: Right, Lateral Cleanser Richert, Heber ([id number]) [id number] Page 5 of 5 Byram Ancillary Kit - 15 Day Supply Discharge Instruction: Use supplies as instructed; Kit contains: (15) Saline Bullets; (15) 3x3 Gauze; 15 pr Gloves Soap and Water Discharge Instruction: Gently cleanse wound with antibacterial soap, rinse and pat dry prior to dressing wounds Vashe 5.8 (oz) Discharge Instruction: Use vashe 5.8 (oz) as directed Peri-Wound Care Topical Triamcinolone Acetonide Cream, 0.1%, 15 (g) tube Discharge Instruction: Apply as directed by Azarion Hove. TO LEG Primary Dressing Silvercel 4 1/4x 4 1/4 (in/in) Discharge Instruction: Apply Silvercel 4 1/4x 4 1/4 (in/in) as instructed Secondary Dressing Zetuvit Plus 4x4 (in/in) Secured With Compression Wrap Urgo K2 Lite, two layer compression system, regular Compression Stockings Add-Ons Electronic Signature(s) Signed: 08/05/2022 8:17:18 AM By: Betha Loa Signed: 08/06/2022 9:53:20 AM By: Yevonne Pax RN Entered By: Betha Loa  on 08/04/2022 12:54:15

## 2022-08-07 ENCOUNTER — Encounter: Payer: 59 | Admitting: Physician Assistant

## 2022-08-07 DIAGNOSIS — F17218 Nicotine dependence, cigarettes, with other nicotine-induced disorders: Secondary | ICD-10-CM | POA: Diagnosis not present

## 2022-08-07 DIAGNOSIS — L97822 Non-pressure chronic ulcer of other part of left lower leg with fat layer exposed: Secondary | ICD-10-CM | POA: Diagnosis not present

## 2022-08-07 DIAGNOSIS — I87333 Chronic venous hypertension (idiopathic) with ulcer and inflammation of bilateral lower extremity: Secondary | ICD-10-CM | POA: Diagnosis not present

## 2022-08-07 DIAGNOSIS — E11622 Type 2 diabetes mellitus with other skin ulcer: Secondary | ICD-10-CM | POA: Diagnosis not present

## 2022-08-07 DIAGNOSIS — L97312 Non-pressure chronic ulcer of right ankle with fat layer exposed: Secondary | ICD-10-CM | POA: Diagnosis not present

## 2022-08-07 NOTE — Progress Notes (Signed)
Widen, [name] ([id number]) [id number] Page 1 of 9 Visit Report for 08/07/2022 Arrival Information Details Patient Name: Date of Service: [name], [name] 08/07/2022 12:00 PM Medical Record Number: [id number] Patient Account Number: [id number] Date of Birth/Sex: Treating RN: [birth year] (62 y.o. [name] Primary Care Maris Abascal: Yves Dill Other Clinician: Referring Hayk Divis: Treating Kam Kushnir/Extender: Luan Moore, Neelam Weeks in Treatment: 7 Visit Information History Since Last Visit Added or deleted any medications: No Patient Arrived: Ambulatory Any new allergies or adverse reactions: No Arrival Time: 12:06 Had a fall or experienced change in No Accompanied By: self activities of daily living that may affect Transfer Assistance: None risk of falls: Patient Identification Verified: Yes Hospitalized since last visit: No Secondary Verification Process Completed: Yes Has Dressing in Place as Prescribed: Yes Patient Requires Transmission-Based Precautions: No Has Compression in Place as Prescribed: Yes Patient Has Alerts: No Pain Present Now: No Electronic Signature(s) Signed: 08/07/2022 4:04:22 PM By: Angelina Pih Entered By: Angelina Pih on 08/07/2022 12:07:12 -------------------------------------------------------------------------------- Clinic Level of Care Assessment Details Patient Name: Date of Service: [name], [name] 08/07/2022 12:00 PM Medical Record Number: [id number] Patient Account Number: [id number] Date of Birth/Sex: Treating RN: 03-09-1961 (62 y.o. [name] Primary Care Minerva Bluett: Yves Dill Other Clinician: Referring Lashannon Bresnan: Treating Glenda Kunst/Extender: Luan Moore, Neelam Weeks in Treatment: 7 Clinic Level of Care Assessment Items TOOL 1 Quantity Score []  - 0 Use when EandM and Procedure is performed on INITIAL visit ASSESSMENTS - Nursing Assessment / Reassessment []  - 0 General Physical Exam (combine w/  comprehensive assessment (listed just below) when performed on new pt. evals) []  - 0 Comprehensive Assessment (HX, ROS, Risk Assessments, Wounds Hx, etc.) ASSESSMENTS - Wound and Skin Assessment / Reassessment []  - 0 Dermatologic / Skin Assessment (not related to wound area) ASSESSMENTS - Ostomy and/or Continence Assessment and Care []  - 0 Incontinence Assessment and Management []  - 0 Ostomy Care Assessment and Management (repouching, etc.) PROCESS - Coordination of Care []  - 0 Simple Patient / Family Education for ongoing care []  - 0 Complex (extensive) Patient / Family Education for ongoing care []  - 0 Staff obtains Chiropractor, Records, T Results / Process Orders est []  - 0 Staff telephones HHA, Nursing Homes / Clarify orders / etc []  - 0 Routine Transfer to another Facility (non-emergent condition) []  - 0 Routine Hospital Admission (non-emergent condition) []  - 0 New Admissions / Insurance Authorizations / Ordering NPWT Apligraf, etc. , []  - 0 Emergency Hospital Admission (emergent condition) PROCESS - Special Needs Wyer, Teng ([id number]) [phone].pdf Page 2 of 9 []  - 0 Pediatric / Minor Patient Management []  - 0 Isolation Patient Management []  - 0 Hearing / Language / Visual special needs []  - 0 Assessment of Community assistance (transportation, D/C planning, etc.) []  - 0 Additional assistance / Altered mentation []  - 0 Support Surface(s) Assessment (bed, cushion, seat, etc.) INTERVENTIONS - Miscellaneous []  - 0 External ear exam []  - 0 Patient Transfer (multiple staff / Nurse, adult / Similar devices) []  - 0 Simple Staple / Suture removal (25 or less) []  - 0 Complex Staple / Suture removal (26 or more) []  - 0 Hypo/Hyperglycemic Management (do not check if billed separately) []  - 0 Ankle / Brachial Index (ABI) - do not check if billed separately Has the patient been seen at the hospital within the last three years: Yes Total Score:  0 Level Of Care: ____ Electronic Signature(s) Signed: 08/07/2022 4:04:22 PM By: Angelina Pih Entered By: Angelina Pih on 08/07/2022  12:54:01 -------------------------------------------------------------------------------- Compression Therapy Details Patient Name: Date of Service: [name], [name] 08/07/2022 12:00 PM Medical Record Number: [id number] Patient Account Number: [id number] Date of Birth/Sex: Treating RN: [birth year] (62 y.o. [name] Primary Care Terisa Belardo: Yves Dill Other Clinician: Referring Pawel Soules: Treating Markavious Micco/Extender: Luan Moore, Neelam Weeks in Treatment: 7 Compression Therapy Performed for Wound Assessment: Wound #1 Left,Circumferential Lower Leg Performed By: Clinician Angelina Pih, RN Compression Type: Double Layer Post Procedure Diagnosis Same as Pre-procedure Electronic Signature(s) Signed: 08/07/2022 4:04:22 PM By: Angelina Pih Entered By: Angelina Pih on 08/07/2022 12:34:55 -------------------------------------------------------------------------------- Compression Therapy Details Patient Name: Date of Service: [name], [name] 08/07/2022 12:00 PM Medical Record Number: [id number] Patient Account Number: [id number] Date of Birth/Sex: Treating RN: 09-11-1960 (62 y.o. [name] Primary Care Gazella Anglin: Yves Dill Other Clinician: Referring Carlos Quackenbush: Treating Fusaye Wachtel/Extender: Luan Moore, Neelam Weeks in Treatment: 7 Compression Therapy Performed for Wound Assessment: Wound #2 Right,Lateral Ankle Performed By: Clinician Angelina Pih, RN Compression Type: Double Layer Post Procedure Diagnosis Same as Pre-procedure Electronic Signature(s) Signed: 08/07/2022 4:04:22 PM By: Deboraha Sprang [phone] PM By: Angelina Pih [phone].pdf Page 3 of 9 Signed: 08/07/2022 Entered By: Angelina Pih on 08/07/2022  12:34:55 -------------------------------------------------------------------------------- Encounter Discharge Information Details Patient Name: Date of Service: [name], [name] 08/07/2022 12:00 PM Medical Record Number: [id number] Patient Account Number: [id number] Date of Birth/Sex: Treating RN: 1960/04/08 (62 y.o. [name] Primary Care Graciano Batson: Yves Dill Other Clinician: Referring Celvin Taney: Treating Jackee Glasner/Extender: Luan Moore, Neelam Weeks in Treatment: 7 Encounter Discharge Information Items Discharge Condition: Stable Ambulatory Status: Ambulatory Discharge Destination: Home Transportation: Private Auto Accompanied By: SELF Schedule Follow-up Appointment: Yes Clinical Summary of Care: Electronic Signature(s) Signed: 08/07/2022 4:04:22 PM By: Angelina Pih Entered By: Angelina Pih on 08/07/2022 12:55:06 -------------------------------------------------------------------------------- Lower Extremity Assessment Details Patient Name: Date of Service: [name], [name] 08/07/2022 12:00 PM Medical Record Number: [id number] Patient Account Number: [id number] Date of Birth/Sex: Treating RN: May 18, 1960 (62 y.o. [name] Primary Care Malone Admire: Yves Dill Other Clinician: Referring Jiovanny Burdell: Treating Catlin Aycock/Extender: Luan Moore, Neelam Weeks in Treatment: 7 Edema Assessment Assessed: [Left: No] [Right: No] [Left: Edema] [Right: :] Calf Left: Right: Point of Measurement: 33 cm From Medial Instep 33.6 cm 35 cm Ankle Left: Right: Point of Measurement: 12 cm From Medial Instep 21.4 cm 21.9 cm Vascular Assessment Pulses: Dorsalis Pedis Palpable: [Left:Yes] [Right:Yes] Electronic Signature(s) Signed: 08/07/2022 4:04:22 PM By: Angelina Pih Entered By: Angelina Pih on 08/07/2022 12:24:13 -------------------------------------------------------------------------------- Multi Wound Chart Details Patient Name: Date of Service: [name], [name]  08/07/2022 12:00 PM Medical Record Number: [id number] Patient Account Number: [id number] Date of Birth/Sex: Treating RN: 03/15/1961 (62 y.o. [name] Primary Care Kylii Ennis: Yves Dill Other Clinician: Alecia Lemming ([id number]) [id number] Page 4 of 9 Referring Jaymes Hang: Treating Dynisha Due/Extender: Luan Moore, Neelam Weeks in Treatment: 7 Vital Signs Height(in): 70 Pulse(bpm): 105 Weight(lbs): 180 Blood Pressure(mmHg): 138/78 Body Mass Index(BMI): 25.8 Temperature(F): 98 Respiratory Rate(breaths/min): 18 [1:Photos:] [N/A:N/A] Left, Circumferential Lower Leg Right, Lateral Ankle N/A Wound Location: Gradually Appeared Gradually Appeared N/A Wounding Event: Venous Leg Ulcer Venous Leg Ulcer N/A Primary Etiology: Rheumatoid Arthritis Rheumatoid Arthritis N/A Comorbid History: 03/24/2022 03/24/2022 N/A Date Acquired: 7 7 N/A Weeks of Treatment: Open Open N/A Wound Status: No No N/A Wound Recurrence: 6x12.5x0.1 1.5x0.4x0.1 N/A Measurements L x W x D (cm) 58.905 0.471 N/A A (cm) : rea 5.89 0.047 N/A Volume (cm) : 37.00% 87.00% N/A % Reduction in A rea: 37.00% 93.50%  N/A % Reduction in Volume: Full Thickness Without Exposed Full Thickness Without Exposed N/A Classification: Support Structures Support Structures Medium Medium N/A Exudate A mount: Serosanguineous Serosanguineous N/A Exudate Type: red, brown red, brown N/A Exudate Color: Medium (34-66%) Large (67-100%) N/A Granulation A mount: Red, Pink Red N/A Granulation Quality: Medium (34-66%) None Present (0%) N/A Necrotic A mount: Fat Layer (Subcutaneous Tissue): Yes Fat Layer (Subcutaneous Tissue): No N/A Exposed Structures: None None N/A Epithelialization: Treatment Notes Electronic Signature(s) Signed: 08/07/2022 4:04:22 PM By: Angelina Pih Entered By: Angelina Pih on 08/07/2022  12:33:55 -------------------------------------------------------------------------------- Multi-Disciplinary Care Plan Details Patient Name: Date of Service: [name], [name] 08/07/2022 12:00 PM Medical Record Number: [id number] Patient Account Number: [id number] Date of Birth/Sex: Treating RN: 06-15-1960 (62 y.o. [name] Primary Care Chinedum Vanhouten: Yves Dill Other Clinician: Referring Ramari Bray: Treating Donette Mainwaring/Extender: Luan Moore, Neelam Weeks in Treatment: [address] Inactive Cofield, [name] ([id number]) [id number] Page 5 of 9 Wound/Skin Impairment Nursing Diagnoses: Impaired tissue integrity Knowledge deficit related to ulceration/compromised skin integrity Goals: Ulcer/skin breakdown will have a volume reduction of 30% by week 4 Date Initiated: 06/19/2022 Date Inactivated: 07/17/2022 Target Resolution Date: 07/17/2022 Goal Status: Met Ulcer/skin breakdown will have a volume reduction of 50% by week 8 Date Initiated: 06/19/2022 Target Resolution Date: 08/14/2022 Goal Status: Active Ulcer/skin breakdown will have a volume reduction of 80% by week 12 Date Initiated: 06/19/2022 Target Resolution Date: 09/11/2022 Goal Status: Active Ulcer/skin breakdown will heal within 14 weeks Date Initiated: 06/19/2022 Target Resolution Date: 09/25/2022 Goal Status: Active Interventions: Assess patient/caregiver ability to obtain necessary supplies Assess patient/caregiver ability to perform ulcer/skin care regimen upon admission and as needed Assess ulceration(s) every visit Provide education on ulcer and skin care Treatment Activities: Referred to DME Jeylin Woodmansee for dressing supplies : 06/19/2022 Skin care regimen initiated : 06/19/2022 Notes: Electronic Signature(s) Signed: 08/07/2022 4:04:22 PM By: Angelina Pih Entered By: Angelina Pih on 08/07/2022 12:54:27 -------------------------------------------------------------------------------- Pain Assessment  Details Patient Name: Date of Service: [name], [name] 08/07/2022 12:00 PM Medical Record Number: [id number] Patient Account Number: [id number] Date of Birth/Sex: Treating RN: December 10, 1960 (62 y.o. [name] Primary Care Chamya Hunton: Yves Dill Other Clinician: Referring Carrington Mullenax: Treating Vyla Pint/Extender: Luan Moore, Neelam Weeks in Treatment: 7 Active Problems Location of Pain Severity and Description of Pain Patient Has Paino No Site Locations Rate the pain. Current Pain Level: 0 Pain Management and Medication Current Pain Management: Hoffmeier, Jamere ([id number]) [phone].pdf Page 6 of 9 Electronic Signature(s) Signed: 08/07/2022 4:04:22 PM By: Angelina Pih Entered By: Angelina Pih on 08/07/2022 12:10:01 -------------------------------------------------------------------------------- Patient/Caregiver Education Details Patient Name: Date of Service: [name], [name] 5/9/2024andnbsp12:00 PM Medical Record Number: [id number] Patient Account Number: [id number] Date of Birth/Gender: Treating RN: 1960/09/23 (62 y.o. [name] Primary Care Physician: Yves Dill Other Clinician: Referring Physician: Treating Physician/Extender: Leslee Home in Treatment: 7 Education Assessment Education Provided To: Patient Education Topics Provided Wound/Skin Impairment: Handouts: Caring for Your Ulcer Methods: Explain/Verbal Responses: State content correctly Electronic Signature(s) Signed: 08/07/2022 4:04:22 PM By: Angelina Pih Entered By: Angelina Pih on 08/07/2022 12:54:38 -------------------------------------------------------------------------------- Wound Assessment Details Patient Name: Date of Service: [name], [name] 08/07/2022 12:00 PM Medical Record Number: [id number] Patient Account Number: [id number] Date of Birth/Sex: Treating RN: May 28, 1960 (62 y.o. [name] Primary Care Wayne Brunker: Yves Dill Other  Clinician: Referring Maverick Dieudonne: Treating August Longest/Extender: Luan Moore, Neelam Weeks in Treatment: 7 Wound Status Wound Number: 1 Primary Etiology: Venous Leg Ulcer Wound Location: Left, Circumferential Lower Leg Wound  Status: Open Wounding Event: Gradually Appeared Comorbid History: Rheumatoid Arthritis Date Acquired: 03/24/2022 Weeks Of Treatment: 7 Clustered Wound: No Photos Wound Measurements Length: (cm) 6 Width: (cm) 12.5 Depth: (cm) 0.1 Wilbert, Dryden ([id number]) Area: (cm) 58 Volume: (cm) 5. % Reduction in Area: 37% % Reduction in Volume: 37% Epithelialization: None [id number] Page 7 of 9 .905 Tunneling: No 89 Undermining: No Wound Description Classification: Full Thickness Without Exposed Support Structures Exudate Amount: Medium Exudate Type: Serosanguineous Exudate Color: red, brown Foul Odor After Cleansing: No Slough/Fibrino Yes Wound Bed Granulation Amount: Medium (34-66%) Exposed Structure Granulation Quality: Red, Pink Fat Layer (Subcutaneous Tissue) Exposed: Yes Necrotic Amount: Medium (34-66%) Necrotic Quality: Adherent Slough Treatment Notes Wound #1 (Lower Leg) Wound Laterality: Left, Circumferential Cleanser Byram Ancillary Kit - 15 Day Supply Discharge Instruction: Use supplies as instructed; Kit contains: (15) Saline Bullets; (15) 3x3 Gauze; 15 pr Gloves Soap and Water Discharge Instruction: Gently cleanse wound with antibacterial soap, rinse and pat dry prior to dressing wounds Vashe 5.8 (oz) Discharge Instruction: Use vashe 5.8 (oz) as directed Peri-Wound Care Topical Triamcinolone Acetonide Cream, 0.1%, 15 (g) tube Discharge Instruction: Apply as directed by Jeilyn Reznik. TO LEG Primary Dressing Silvercel 4 1/4x 4 1/4 (in/in) Discharge Instruction: Apply Silvercel 4 1/4x 4 1/4 (in/in) as instructed Secondary Dressing Zetuvit Plus 4x4 (in/in) Secured With Compression Wrap Urgo K2 Lite, two layer  compression system, regular Compression Stockings Add-Ons Electronic Signature(s) Signed: 08/07/2022 4:04:22 PM By: Angelina Pih Entered By: Angelina Pih on 08/07/2022 12:23:02 -------------------------------------------------------------------------------- Wound Assessment Details Patient Name: Date of Service: [name], [name] 08/07/2022 12:00 PM Medical Record Number: [id number] Patient Account Number: [id number] Date of Birth/Sex: Treating RN: Feb 24, 1961 (62 y.o. [name] Primary Care Dalante Minus: Yves Dill Other Clinician: Referring Mieka Leaton: Treating Jahmarion Popoff/Extender: Luan Moore, Neelam Weeks in Treatment: 7 Wound Status Wound Number: 2 Primary Etiology: Venous Leg Ulcer Wound Location: Right, Lateral Ankle Wound Status: Open Wounding Event: Gradually Appeared Comorbid History: Rheumatoid Arthritis Date Acquired: 03/24/2022 Weeks Of Treatment: 7 Clustered Wound: No Photos Brys, Vontrell ([id number]) [phone].pdf Page 8 of 9 Wound Measurements Length: (cm) 1.5 Width: (cm) 0.4 Depth: (cm) 0.1 Area: (cm) 0.471 Volume: (cm) 0.047 % Reduction in Area: 87% % Reduction in Volume: 93.5% Epithelialization: None Tunneling: No Undermining: No Wound Description Classification: Full Thickness Without Exposed Suppor Exudate Amount: Medium Exudate Type: Serosanguineous Exudate Color: red, brown t Structures Foul Odor After Cleansing: No Slough/Fibrino Yes Wound Bed Granulation Amount: Large (67-100%) Exposed Structure Granulation Quality: Red Fat Layer (Subcutaneous Tissue) Exposed: No Necrotic Amount: None Present (0%) Treatment Notes Wound #2 (Ankle) Wound Laterality: Right, Lateral Cleanser Byram Ancillary Kit - 15 Day Supply Discharge Instruction: Use supplies as instructed; Kit contains: (15) Saline Bullets; (15) 3x3 Gauze; 15 pr Gloves Soap and Water Discharge Instruction: Gently cleanse wound with antibacterial soap, rinse  and pat dry prior to dressing wounds Vashe 5.8 (oz) Discharge Instruction: Use vashe 5.8 (oz) as directed Peri-Wound Care Topical Triamcinolone Acetonide Cream, 0.1%, 15 (g) tube Discharge Instruction: Apply as directed by Ty Buntrock. TO LEG Primary Dressing Silvercel 4 1/4x 4 1/4 (in/in) Discharge Instruction: Apply Silvercel 4 1/4x 4 1/4 (in/in) as instructed Secondary Dressing Zetuvit Plus 4x4 (in/in) Secured With Compression Wrap Urgo K2 Lite, two layer compression system, regular Compression Stockings Add-Ons Electronic Signature(s) Signed: 08/07/2022 4:04:22 PM By: Angelina Pih Entered By: Angelina Pih on 08/07/2022 12:23:33 -------------------------------------------------------------------------------- Vitals Details Patient Name: Date of Service: [name], [name] 08/07/2022 12:00 PM Fennel, Granville ([id number]) [id number] Page 9 of  9 Medical Record Number: [id number] Patient Account Number: [id number] Date of Birth/Sex: Treating RN: 04-11-1960 (62 y.o. [name] Primary Care Odie Rauen: Yves Dill Other Clinician: Referring Gennell How: Treating Ryli Standlee/Extender: Luan Moore, Neelam Weeks in Treatment: 7 Vital Signs Time Taken: 12:07 Temperature (F): 98 Height (in): 70 Pulse (bpm): 105 Weight (lbs): 180 Respiratory Rate (breaths/min): 18 Body Mass Index (BMI): 25.8 Blood Pressure (mmHg): 138/78 Reference Range: 80 - 120 mg / dl Electronic Signature(s) Signed: 08/07/2022 4:04:22 PM By: Angelina Pih Entered By: Angelina Pih on 08/07/2022 12:09:54

## 2022-08-07 NOTE — Progress Notes (Addendum)
Mcglaun, [name] ([id number]) [id number] Page 1 of 6 Visit Report for 08/07/2022 Chief Complaint Document Details Patient Name: Date of Service: [name], [name] 08/07/2022 12:00 PM Medical Record Number: [id number] Patient Account Number: [id number] Date of Birth/Sex: Treating RN: 05/02/1960 (62 y.o. [name] Primary Care Provider: Yves Dill Other Clinician: Referring Provider: Treating Provider/Extender: Luan Moore, Neelam Weeks in Treatment: 7 Information Obtained from: Patient Chief Complaint Bilateral LE Ulcers Electronic Signature(s) Signed: 08/07/2022 12:31:30 PM By: Allen Derry PA-C Entered By: Allen Derry on 08/07/2022 12:31:30 -------------------------------------------------------------------------------- HPI Details Patient Name: Date of Service: [name], [name] 08/07/2022 12:00 PM Medical Record Number: [id number] Patient Account Number: [id number] Date of Birth/Sex: Treating RN: [birth year] (62 y.o. [name] Primary Care Provider: Yves Dill Other Clinician: Referring Provider: Treating Provider/Extender: Luan Moore, Neelam Weeks in Treatment: 7 History of Present Illness HPI Description: 06-19-2022 upon evaluation today patient appears to be doing somewhat poorly in regard to a wound that began around Christmas time on the patient's ankle and lower extremities bilaterally. He tells me that he has been having quite significant pain at this point unfortunately. He did go to the emergency department on March 2. However this has been going on since around Christmas. On March 2 he had x-rays that were negative and I did review that today as well. The patient also has what appears to be chronic venous stasis which is an ongoing issue for him here as well and I think that is part of the issue. Right now it is just hard for him to wear anything compression wise although long-term I think he is going to need something. He does smoke  currently that something that he needs to stop. Patient does have a history of diabetes mellitus type 2 which is stated to be diet controlled. He also again is a current smoker which I think is something that he needs to get rid of completely. 06-26-2022 upon evaluation today patient appears to be doing well currently in regard to his wounds all things considered he tells me the pain is a little bit better he has been on the correct antibiotic for only 2 days so he is seeing some improvement already this is already a good sign. Fortunately there does not appear to be any signs of infection locally nor systemically which is great news. No fevers, chills, nausea, vomiting, or diarrhea. 07-03-2022 upon evaluation today patient appears to be doing better in regard to his leg the infection is clearing very well and I am actually much more pleased today with where things stand compared to where they were. Fortunately I do not see any signs of active infection locally or systemically at this time which is great news. No fevers, chills, nausea, vomiting, or diarrhea. 07-10-2022 upon evaluation today patient appears to be doing better I do not see any signs of infection I think he is much better in that regard. Fortunately I do not see any evidence of active infection locally or systemically which is great news. 07-17-2022 upon evaluation today patient actually appears to be doing better in regard to his wounds. He is showing some signs of improvement we actually now able to wrap him and I do think he is now allow me to do some debridement today as well which will also be good news. Fortunately I do not see any evidence of active infection locally nor systemically which is great news. 07-24-2022 upon evaluation patient actually appears to be making good progress  here and I am very pleased with where we stand. I do think that he is showing signs of improvement I think that the medication is doing a good job as far  as the new epithelial growth is concerned. He is in agreement with continuing with the current regimen the compression wrap has been beneficial the alginate did well but I do think we probably need to change this more than once a week. 07-31-2022 upon evaluation today patient appears to be doing well currently in regard to his wound. He has been tolerating the dressing changes on both legs without complication and overall I am extremely pleased actually with where we stand today. I do not see any signs of active infection 08-07-2022 upon evaluation today patient appears to be doing well currently in regard to his wounds. He is actually making excellent progress and in general I do feel like that he is really doing quite well. There does not appear to be any signs of active infection locally nor systemically which is great news. No fevers, chills, nausea, vomiting, or diarrhea. Electronic Signature(s) Signed: 08/07/2022 12:52:37 PM By: Allen Derry PA-C Entered By: Allen Derry on 08/07/2022 12:52:37 Vierra, [name] ([id number]) [id number] Page 2 of 6 -------------------------------------------------------------------------------- Physical Exam Details Patient Name: Date of Service: [name], [name] 08/07/2022 12:00 PM Medical Record Number: [id number] Patient Account Number: [id number] Date of Birth/Sex: Treating RN: [birth year] (62 y.o. [name] Primary Care Provider: Yves Dill Other Clinician: Referring Provider: Treating Provider/Extender: Luan Moore, Neelam Weeks in Treatment: 7 Constitutional Well-nourished and well-hydrated in no acute distress. Respiratory normal breathing without difficulty. Psychiatric this patient is able to make decisions and demonstrates good insight into disease process. Alert and Oriented x 3. pleasant and cooperative. Notes Upon inspection patient's wound bed actually showed signs of good granulation and epithelization at this  point. I think that he is actually making excellent progress I am extremely pleased with where we stand and I do believe that the alginate is doing awesome job here. He has a tremendous amount of epithelization currently. Electronic Signature(s) Signed: 08/07/2022 12:53:01 PM By: Allen Derry PA-C Entered By: Allen Derry on 08/07/2022 12:53:01 -------------------------------------------------------------------------------- Physician Orders Details Patient Name: Date of Service: [name], [name] 08/07/2022 12:00 PM Medical Record Number: [id number] Patient Account Number: [id number] Date of Birth/Sex: Treating RN: 13-May-1960 (62 y.o. [name] Primary Care Provider: Yves Dill Other Clinician: Referring Provider: Treating Provider/Extender: Luan Moore, Neelam Weeks in Treatment: 7 Verbal / Phone Orders: No Diagnosis Coding ICD-10 Coding Code Description [phone] Chronic venous hypertension (idiopathic) with ulcer and inflammation of bilateral lower extremity L97.312 Non-pressure chronic ulcer of right ankle with fat layer exposed L97.822 Non-pressure chronic ulcer of other part of left lower leg with fat layer exposed F17.218 Nicotine dependence, cigarettes, with other nicotine-induced disorders E11.622 Type 2 diabetes mellitus with other skin ulcer Follow-up Appointments Return Appointment in 1 week. Bathing/ Shower/ Hygiene May shower; gently cleanse wound with antibacterial soap, rinse and pat dry prior to dressing wounds No tub bath. Edema Control - Lymphedema / Segmental Compressive Device / Other Bilateral Lower Extremities 3 Layer Compression System for Lymphedema. - Urgo 2 layer wrap used size regular Elevate, Exercise Daily and A void Standing for Long Periods of Time. Elevate legs to the level of the heart and pump ankles as often as possible Elevate leg(s) parallel to the floor when sitting. DO YOUR BEST to sleep in the bed at night. DO NOT sleep in your recliner.  Long hours of sitting in a recliner leads to swelling of the legs and/or potential wounds on your backside. Wound Treatment Wound #1 - Lower Leg Wound Laterality: Left, Circumferential Cleanser: Byram Ancillary Kit - 15 Day Supply (Generic) 1 x Per Week/30 Days Discharge Instructions: Use supplies as instructed; Kit contains: (15) Saline Bullets; (15) 3x3 Gauze; 15 pr Gloves Busker, Caylin ([id number]) [id number] Page 3 of 6 Cleanser: Soap and Water 1 x Per Week/30 Days Discharge Instructions: Gently cleanse wound with antibacterial soap, rinse and pat dry prior to dressing wounds Cleanser: Vashe 5.8 (oz) 1 x Per Week/30 Days Discharge Instructions: Use vashe 5.8 (oz) as directed Topical: Triamcinolone Acetonide Cream, 0.1%, 15 (g) tube 1 x Per Week/30 Days Discharge Instructions: Apply as directed by provider. TO LEG Prim Dressing: Silvercel 4 1/4x 4 1/4 (in/in) (Generic) 1 x Per Week/30 Days ary Discharge Instructions: Apply Silvercel 4 1/4x 4 1/4 (in/in) as instructed Secondary Dressing: Zetuvit Plus 4x4 (in/in) 1 x Per Week/30 Days Compression Wrap: Urgo K2 Lite, two layer compression system, regular 1 x Per Week/30 Days Wound #2 - Ankle Wound Laterality: Right, Lateral Cleanser: Byram Ancillary Kit - 15 Day Supply (Generic) 1 x Per Week/30 Days Discharge Instructions: Use supplies as instructed; Kit contains: (15) Saline Bullets; (15) 3x3 Gauze; 15 pr Gloves Cleanser: Soap and Water 1 x Per Week/30 Days Discharge Instructions: Gently cleanse wound with antibacterial soap, rinse and pat dry prior to dressing wounds Cleanser: Vashe 5.8 (oz) 1 x Per Week/30 Days Discharge Instructions: Use vashe 5.8 (oz) as directed Topical: Triamcinolone Acetonide Cream, 0.1%, 15 (g) tube 1 x Per Week/30 Days Discharge Instructions: Apply as directed by provider. TO LEG Prim Dressing: Silvercel 4 1/4x 4 1/4 (in/in) (Generic) 1 x Per Week/30 Days ary Discharge Instructions:  Apply Silvercel 4 1/4x 4 1/4 (in/in) as instructed Secondary Dressing: Zetuvit Plus 4x4 (in/in) 1 x Per Week/30 Days Compression Wrap: Urgo K2 Lite, two layer compression system, regular 1 x Per Week/30 Days Electronic Signature(s) Signed: 08/07/2022 4:04:13 PM By: Allen Derry PA-C Signed: 08/07/2022 4:04:22 PM By: Angelina Pih Entered By: Angelina Pih on 08/07/2022 12:37:36 -------------------------------------------------------------------------------- Problem List Details Patient Name: Date of Service: [name], [name] 08/07/2022 12:00 PM Medical Record Number: [id number] Patient Account Number: [id number] Date of Birth/Sex: Treating RN: [birth year] (62 y.o. [name] Primary Care Provider: Yves Dill Other Clinician: Referring Provider: Treating Provider/Extender: Luan Moore, Neelam Weeks in Treatment: 7 Active Problems ICD-10 Encounter Code Description Active Date MDM Diagnosis I87.333 Chronic venous hypertension (idiopathic) with ulcer and inflammation of 06/19/2022 No Yes bilateral lower extremity L97.312 Non-pressure chronic ulcer of right ankle with fat layer exposed 06/19/2022 No Yes L97.822 Non-pressure chronic ulcer of other part of left lower leg with fat layer exposed3/21/2024 No Yes F17.218 Nicotine dependence, cigarettes, with other nicotine-induced disorders 06/19/2022 No Yes Borghi, Aedan ([id number]) [id number] Page 4 of 6 E11.622 Type 2 diabetes mellitus with other skin ulcer 06/19/2022 No Yes Inactive Problems Resolved Problems Electronic Signature(s) Signed: 08/07/2022 12:31:27 PM By: Allen Derry PA-C Entered By: Allen Derry on 08/07/2022 12:31:27 -------------------------------------------------------------------------------- Progress Note Details Patient Name: Date of Service: [name], [name] 08/07/2022 12:00 PM Medical Record Number: [id number] Patient Account Number: [id number] Date of Birth/Sex: Treating RN: January 14, 1961 (62  y.o. [name] Primary Care Provider: Yves Dill Other Clinician: Referring Provider: Treating Provider/Extender: Luan Moore, Neelam Weeks in Treatment: 7 Subjective Chief Complaint Information obtained from Patient Bilateral LE Ulcers History of Present Illness (HPI) 06-19-2022 upon  evaluation today patient appears to be doing somewhat poorly in regard to a wound that began around Christmas time on the patient's ankle and lower extremities bilaterally. He tells me that he has been having quite significant pain at this point unfortunately. He did go to the emergency department on March 2. However this has been going on since around Christmas. On March 2 he had x-rays that were negative and I did review that today as well. The patient also has what appears to be chronic venous stasis which is an ongoing issue for him here as well and I think that is part of the issue. Right now it is just hard for him to wear anything compression wise although long-term I think he is going to need something. He does smoke currently that something that he needs to stop. Patient does have a history of diabetes mellitus type 2 which is stated to be diet controlled. He also again is a current smoker which I think is something that he needs to get rid of completely. 06-26-2022 upon evaluation today patient appears to be doing well currently in regard to his wounds all things considered he tells me the pain is a little bit better he has been on the correct antibiotic for only 2 days so he is seeing some improvement already this is already a good sign. Fortunately there does not appear to be any signs of infection locally nor systemically which is great news. No fevers, chills, nausea, vomiting, or diarrhea. 07-03-2022 upon evaluation today patient appears to be doing better in regard to his leg the infection is clearing very well and I am actually much more pleased today with where things stand compared  to where they were. Fortunately I do not see any signs of active infection locally or systemically at this time which is great news. No fevers, chills, nausea, vomiting, or diarrhea. 07-10-2022 upon evaluation today patient appears to be doing better I do not see any signs of infection I think he is much better in that regard. Fortunately I do not see any evidence of active infection locally or systemically which is great news. 07-17-2022 upon evaluation today patient actually appears to be doing better in regard to his wounds. He is showing some signs of improvement we actually now able to wrap him and I do think he is now allow me to do some debridement today as well which will also be good news. Fortunately I do not see any evidence of active infection locally nor systemically which is great news. 07-24-2022 upon evaluation patient actually appears to be making good progress here and I am very pleased with where we stand. I do think that he is showing signs of improvement I think that the medication is doing a good job as far as the new epithelial growth is concerned. He is in agreement with continuing with the current regimen the compression wrap has been beneficial the alginate did well but I do think we probably need to change this more than once a week. 07-31-2022 upon evaluation today patient appears to be doing well currently in regard to his wound. He has been tolerating the dressing changes on both legs without complication and overall I am extremely pleased actually with where we stand today. I do not see any signs of active infection 08-07-2022 upon evaluation today patient appears to be doing well currently in regard to his wounds. He is actually making excellent progress and in general I do feel like that  he is really doing quite well. There does not appear to be any signs of active infection locally nor systemically which is great news. No fevers, chills, nausea, vomiting, or  diarrhea. Objective Constitutional Muzio, Clements ([id number]) [id number] Page 5 of 6 Well-nourished and well-hydrated in no acute distress. Vitals Time Taken: 12:07 PM, Height: 70 in, Weight: 180 lbs, BMI: 25.8, Temperature: 98 F, Pulse: 105 bpm, Respiratory Rate: 18 breaths/min, Blood Pressure: 138/78 mmHg. Respiratory normal breathing without difficulty. Psychiatric this patient is able to make decisions and demonstrates good insight into disease process. Alert and Oriented x 3. pleasant and cooperative. General Notes: Upon inspection patient's wound bed actually showed signs of good granulation and epithelization at this point. I think that he is actually making excellent progress I am extremely pleased with where we stand and I do believe that the alginate is doing awesome job here. He has a tremendous amount of epithelization currently. Integumentary (Hair, Skin) Wound #1 status is Open. Original cause of wound was Gradually Appeared. The date acquired was: 03/24/2022. The wound has been in treatment 7 weeks. The wound is located on the Left,Circumferential Lower Leg. The wound measures 6cm length x 12.5cm width x 0.1cm depth; 58.905cm^2 area and 5.89cm^3 volume. There is Fat Layer (Subcutaneous Tissue) exposed. There is no tunneling or undermining noted. There is a medium amount of serosanguineous drainage noted. There is medium (34-66%) red, pink granulation within the wound bed. There is a medium (34-66%) amount of necrotic tissue within the wound bed including Adherent Slough. Wound #2 status is Open. Original cause of wound was Gradually Appeared. The date acquired was: 03/24/2022. The wound has been in treatment 7 weeks. The wound is located on the Right,Lateral Ankle. The wound measures 1.5cm length x 0.4cm width x 0.1cm depth; 0.471cm^2 area and 0.047cm^3 volume. There is no tunneling or undermining noted. There is a medium amount of serosanguineous  drainage noted. There is large (67-100%) red granulation within the wound bed. There is no necrotic tissue within the wound bed. 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 There was a Double Layer Compression Therapy Procedure by Angelina Pih, RN. Post procedure Diagnosis Wound #1: Same as Pre-Procedure Wound #2 Pre-procedure diagnosis of Wound #2 is a Venous Leg Ulcer located on the Right,Lateral Ankle . There was a Double Layer Compression Therapy Procedure by Angelina Pih, RN. Post procedure Diagnosis Wound #2: Same as Pre-Procedure Plan Follow-up Appointments: Return Appointment in 1 week. Bathing/ Shower/ Hygiene: May shower; gently cleanse wound with antibacterial soap, rinse and pat dry prior to dressing wounds No tub bath. Edema Control - Lymphedema / Segmental Compressive Device / Other: 3 Layer Compression System for Lymphedema. - Urgo 2 layer wrap used size regular Elevate, Exercise Daily and Avoid Standing for Long Periods of Time. Elevate legs to the level of the heart and pump ankles as often as possible Elevate leg(s) parallel to the floor when sitting. DO YOUR BEST to sleep in the bed at night. DO NOT sleep in your recliner. Long hours of sitting in a recliner leads to swelling of the legs and/or potential wounds  on your backside. WOUND #1: - Lower Leg Wound Laterality: Left, Circumferential Cleanser: Byram Ancillary Kit - 15 Day Supply (Generic) 1 x Per Week/30 Days Discharge Instructions:  Use supplies as instructed; Kit contains: (15) Saline Bullets; (15) 3x3 Gauze; 15 pr Gloves Cleanser: Soap and Water 1 x Per Week/30 Days Discharge Instructions: Gently cleanse wound with antibacterial soap, rinse and pat dry prior to dressing wounds Cleanser: Vashe 5.8 (oz) 1 x Per Week/30 Days Discharge Instructions: Use vashe 5.8 (oz) as directed Topical: Triamcinolone Acetonide Cream, 0.1%, 15 (g) tube 1 x Per Week/30 Days Corzine, Halford ([id number]) [id number] Page 6 of 6 Discharge Instructions: Apply as directed by provider. TO LEG Prim Dressing: Silvercel 4 1/4x 4 1/4 (in/in) (Generic) 1 x Per Week/30 Days ary Discharge Instructions: Apply Silvercel 4 1/4x 4 1/4 (in/in) as instructed Secondary Dressing: Zetuvit Plus 4x4 (in/in) 1 x Per Week/30 Days Com pression Wrap: Urgo K2 Lite, two layer compression system, regular 1 x Per Week/30 Days WOUND #2: - Ankle Wound Laterality: Right, Lateral Cleanser: Byram Ancillary Kit - 15 Day Supply (Generic) 1 x Per Week/30 Days Discharge Instructions: Use supplies as instructed; Kit contains: (15) Saline Bullets; (15) 3x3 Gauze; 15 pr Gloves Cleanser: Soap and Water 1 x Per Week/30 Days Discharge Instructions: Gently cleanse wound with antibacterial soap, rinse and pat dry prior to dressing wounds Cleanser: Vashe 5.8 (oz) 1 x Per Week/30 Days Discharge Instructions: Use vashe 5.8 (oz) as directed Topical: Triamcinolone Acetonide Cream, 0.1%, 15 (g) tube 1 x Per Week/30 Days Discharge Instructions: Apply as directed by provider. TO LEG Prim Dressing: Silvercel 4 1/4x 4 1/4 (in/in) (Generic) 1 x Per Week/30 Days ary Discharge Instructions: Apply Silvercel 4 1/4x 4 1/4 (in/in) as instructed Secondary Dressing: Zetuvit Plus 4x4 (in/in) 1 x Per Week/30 Days Com pression Wrap: Urgo K2 Lite, two layer compression system, regular 1 x Per Week/30 Days 1. Based on what I am seeing I do believe that the patient is making really good  progress here and I am very pleased with where we stand. I would recommend that we continue with the silver cell which I think is doing awesome. 2. I am also can recommend that we continue with the Zetuvit to cover. 3. We will also continue with the Urgo K2 compression wraps which I feel like you are doing a great job. We will see patient back for reevaluation in 1 week here in the clinic. If anything worsens or changes patient will contact our office for additional recommendations. Electronic Signature(s) Signed: 08/07/2022 12:53:27 PM By: Allen Derry PA-C Entered By: Allen Derry on 08/07/2022 12:53:27 -------------------------------------------------------------------------------- SuperBill Details Patient Name: Date of Service: [name], [name] 08/07/2022 Medical Record Number: [id number] Patient Account Number: [id number] Date of Birth/Sex: Treating RN: 1960/12/31 (62 y.o. [name] Primary Care Provider: Yves Dill Other Clinician: Referring Provider: Treating Provider/Extender: Luan Moore, Neelam Weeks in Treatment: 7 Diagnosis Coding ICD-10 Codes Code Description [phone] Chronic venous hypertension (idiopathic) with ulcer and inflammation of bilateral lower extremity L97.312 Non-pressure chronic ulcer of right ankle with fat layer exposed L97.822 Non-pressure chronic ulcer of other part of left lower leg with fat layer exposed F17.218 Nicotine dependence, cigarettes, with other nicotine-induced disorders E11.622 Type 2 diabetes mellitus with other skin ulcer Facility Procedures : CPT4: Code [id number] foot Description: 1 BILATERAL: Application of multi-layer venous compression system; leg (below knee), including ankle and . Modifier: Quantity: 1 Electronic Signature(s) Signed: 08/07/2022 4:04:13 PM By: Allen Derry PA-C Signed:  08/07/2022 4:04:22 PM By: Angelina Pih Previous Signature: 08/07/2022 12:54:05 PM Version By: Allen Derry PA-C Entered By: Angelina Pih  on 08/07/2022 12:54:13

## 2022-08-14 ENCOUNTER — Encounter: Payer: 59 | Admitting: Physician Assistant

## 2022-08-14 DIAGNOSIS — E11622 Type 2 diabetes mellitus with other skin ulcer: Secondary | ICD-10-CM | POA: Diagnosis not present

## 2022-08-14 DIAGNOSIS — L97312 Non-pressure chronic ulcer of right ankle with fat layer exposed: Secondary | ICD-10-CM | POA: Diagnosis not present

## 2022-08-14 DIAGNOSIS — F17218 Nicotine dependence, cigarettes, with other nicotine-induced disorders: Secondary | ICD-10-CM | POA: Diagnosis not present

## 2022-08-14 DIAGNOSIS — I87333 Chronic venous hypertension (idiopathic) with ulcer and inflammation of bilateral lower extremity: Secondary | ICD-10-CM | POA: Diagnosis not present

## 2022-08-14 DIAGNOSIS — L97822 Non-pressure chronic ulcer of other part of left lower leg with fat layer exposed: Secondary | ICD-10-CM | POA: Diagnosis not present

## 2022-08-14 DIAGNOSIS — I87313 Chronic venous hypertension (idiopathic) with ulcer of bilateral lower extremity: Secondary | ICD-10-CM | POA: Diagnosis not present

## 2022-08-21 ENCOUNTER — Ambulatory Visit: Payer: PRIVATE HEALTH INSURANCE | Admitting: Physician Assistant

## 2022-08-22 ENCOUNTER — Ambulatory Visit: Payer: PRIVATE HEALTH INSURANCE | Admitting: Internal Medicine

## 2022-09-09 ENCOUNTER — Ambulatory Visit: Payer: PRIVATE HEALTH INSURANCE | Admitting: Physician Assistant

## 2022-09-11 ENCOUNTER — Encounter: Payer: 59 | Attending: Physician Assistant | Admitting: Physician Assistant

## 2022-09-11 DIAGNOSIS — I872 Venous insufficiency (chronic) (peripheral): Secondary | ICD-10-CM | POA: Diagnosis not present

## 2022-09-11 DIAGNOSIS — L97822 Non-pressure chronic ulcer of other part of left lower leg with fat layer exposed: Secondary | ICD-10-CM | POA: Insufficient documentation

## 2022-09-11 DIAGNOSIS — M069 Rheumatoid arthritis, unspecified: Secondary | ICD-10-CM | POA: Insufficient documentation

## 2022-09-11 DIAGNOSIS — F17218 Nicotine dependence, cigarettes, with other nicotine-induced disorders: Secondary | ICD-10-CM | POA: Insufficient documentation

## 2022-09-11 DIAGNOSIS — L97312 Non-pressure chronic ulcer of right ankle with fat layer exposed: Secondary | ICD-10-CM | POA: Diagnosis not present

## 2022-09-11 DIAGNOSIS — E11622 Type 2 diabetes mellitus with other skin ulcer: Secondary | ICD-10-CM | POA: Diagnosis not present

## 2022-09-11 DIAGNOSIS — I87333 Chronic venous hypertension (idiopathic) with ulcer and inflammation of bilateral lower extremity: Secondary | ICD-10-CM | POA: Insufficient documentation

## 2022-09-12 NOTE — Progress Notes (Signed)
Asante, [name] ([id number]) [id number] Page 1 of 7 Visit Report for 09/11/2022 Chief Complaint Document Details Patient Name: Date of Service: [name], [name] 09/11/2022 7:45 A M Medical Record Number: [id number] Patient Account Number: [id number] Date of Birth/Sex: Treating RN: May 15, 1960 (62 y.o. [name] Primary Care Provider: Yves Dill Other Clinician: Referring Provider: Treating Provider/Extender: Luan Moore, Neelam Weeks in Treatment: 12 Information Obtained from: Patient Chief Complaint Bilateral LE Ulcers Electronic Signature(s) Signed: 09/11/2022 8:02:33 AM By: Allen Derry PA-C Entered By: Allen Derry on 09/11/2022 08:02:33 -------------------------------------------------------------------------------- HPI Details Patient Name: Date of Service: [name], [name] 09/11/2022 7:45 A M Medical Record Number: [id number] Patient Account Number: [id number] Date of Birth/Sex: Treating RN: [birth year] (62 y.o. [name] Primary Care Provider: Yves Dill Other Clinician: Referring Provider: Treating Provider/Extender: Luan Moore, Neelam Weeks in Treatment: 12 History of Present Illness HPI Description: 06-19-2022 upon evaluation today patient appears to be doing somewhat poorly in regard to a wound that began around Christmas time on the patient's ankle and lower extremities bilaterally. He tells me that he has been having quite significant pain at this point unfortunately. He did go to the emergency department on March 2. However this has been going on since around Christmas. On March 2 he had x-rays that were negative and I did review that today as well. The patient also has what appears to be chronic venous stasis which is an ongoing issue for him here as well and I think that is part of the issue. Right now it is just hard for him to wear anything compression wise although long-term I think he is going to need something. He does smoke  currently that something that he needs to stop. Patient does have a history of diabetes mellitus type 2 which is stated to be diet controlled. He also again is a current smoker which I think is something that he needs to get rid of completely. 06-26-2022 upon evaluation today patient appears to be doing well currently in regard to his wounds all things considered he tells me the pain is a little bit better he has been on the correct antibiotic for only 2 days so he is seeing some improvement already this is already a good sign. Fortunately there does not appear to be any signs of infection locally nor systemically which is great news. No fevers, chills, nausea, vomiting, or diarrhea. 07-03-2022 upon evaluation today patient appears to be doing better in regard to his leg the infection is clearing very well and I am actually much more pleased today with where things stand compared to where they were. Fortunately I do not see any signs of active infection locally or systemically at this time which is great news. No fevers, chills, nausea, vomiting, or diarrhea. Mcniel, [name] ([id number]) [id number] Page 2 of 7 07-10-2022 upon evaluation today patient appears to be doing better I do not see any signs of infection I think he is much better in that regard. Fortunately I do not see any evidence of active infection locally or systemically which is great news. 07-17-2022 upon evaluation today patient actually appears to be doing better in regard to his wounds. He is showing some signs of improvement we actually now able to wrap him and I do think he is now allow me to do some debridement today as well which will also be good news. Fortunately I do not see any evidence of active infection locally nor systemically which is great news. 07-24-2022  upon evaluation patient actually appears to be making good progress here and I am very pleased with where we stand. I do think that he is  showing signs of improvement I think that the medication is doing a good job as far as the new epithelial growth is concerned. He is in agreement with continuing with the current regimen the compression wrap has been beneficial the alginate did well but I do think we probably need to change this more than once a week. 07-31-2022 upon evaluation today patient appears to be doing well currently in regard to his wound. He has been tolerating the dressing changes on both legs without complication and overall I am extremely pleased actually with where we stand today. I do not see any signs of active infection 08-07-2022 upon evaluation today patient appears to be doing well currently in regard to his wounds. He is actually making excellent progress and in general I do feel like that he is really doing quite well. There does not appear to be any signs of active infection locally nor systemically which is great news. No fevers, chills, nausea, vomiting, or diarrhea. 08-14-2022 upon evaluation today patient appears to be doing well currently in regard to his wounds. He is actually been tolerating the dressing changes without complication. Fortunately there does not appear to be any signs of active infection locally nor systemically which is great news. No fevers, chills, nausea, vomiting, or diarrhea. 09-11-2022 upon evaluation today patient appears to be doing well currently in regard to his wounds he is actually been doing this himself as far as the dressing changes were concerned and seems to have done very well. Fortunately I do not see any signs of active infection locally nor systemically which is great news. Electronic Signature(s) Signed: 09/11/2022 8:29:07 AM By: Allen Derry PA-C Entered By: Allen Derry on 09/11/2022 08:29:06 -------------------------------------------------------------------------------- Physical Exam Details Patient Name: Date of Service: [name], [name] 09/11/2022 7:45 A M Medical  Record Number: [id number] Patient Account Number: [id number] Date of Birth/Sex: Treating RN: [birth year] (62 y.o. [name] Primary Care Provider: Yves Dill Other Clinician: Referring Provider: Treating Provider/Extender: Luan Moore, Neelam Weeks in Treatment: 12 Constitutional Well-nourished and well-hydrated in no acute distress. Respiratory normal breathing without difficulty. Psychiatric this patient is able to make decisions and demonstrates good insight into disease process. Alert and Oriented x 3. pleasant and cooperative. Notes Upon inspection patient's wound bed actually showed signs of good granulation and epithelization at this point. Fortunately I do not see any signs of active infection which is good news and in general I do believe that he is making good progress here. I think that the alginate has done well I do believe that the best plan currently is that we may need to go ahead and see about getting the ability for him to be able to change this on his own set up but I think using Tubigrip should do well. That will be better than try to do a single Tubigrip and a compression sock just double up the Tubigrip. Electronic Signature(s) Signed: 09/11/2022 8:30:15 AM By: Allen Derry PA-C Entered By: Allen Derry on 09/11/2022 08:30:15 Jerry, [name] ([id number]) [id number] Page 3 of 7 -------------------------------------------------------------------------------- Physician Orders Details Patient Name: Date of Service: [name], [name] 09/11/2022 7:45 A M Medical Record Number: [id number] Patient Account Number: [id number] Date of Birth/Sex: Treating RN: 24-Jun-1960 (62 y.o. [name] Primary Care Provider: Yves Dill Other Clinician: Referring Provider: Treating Provider/Extender: Allen Derry  Welton Flakes, Neelam Weeks in Treatment: 12 Verbal / Phone Orders: No Diagnosis Coding ICD-10 Coding Code Description I87.333 Chronic venous  hypertension (idiopathic) with ulcer and inflammation of bilateral lower extremity L97.312 Non-pressure chronic ulcer of right ankle with fat layer exposed L97.822 Non-pressure chronic ulcer of other part of left lower leg with fat layer exposed F17.218 Nicotine dependence, cigarettes, with other nicotine-induced disorders E11.622 Type 2 diabetes mellitus with other skin ulcer Follow-up Appointments Return Appointment in 1 week. Bathing/ Shower/ Hygiene May shower; gently cleanse wound with antibacterial soap, rinse and pat dry prior to dressing wounds No tub bath. Edema Control - Lymphedema / Segmental Compressive Device / Other Bilateral Lower Extremities Tubigrip double layer applied - DD left leg Elevate, Exercise Daily and A void Standing for Long Periods of Time. Elevate legs to the level of the heart and pump ankles as often as possible Elevate leg(s) parallel to the floor when sitting. DO YOUR BEST to sleep in the bed at night. DO NOT sleep in your recliner. Long hours of sitting in a recliner leads to swelling of the legs and/or potential wounds on your backside. Wound Treatment Wound #1 - Lower Leg Wound Laterality: Left, Circumferential Cleanser: Byram Ancillary Kit - 15 Day Supply (Generic) 1 x Per Week/30 Days Discharge Instructions: Use supplies as instructed; Kit contains: (15) Saline Bullets; (15) 3x3 Gauze; 15 pr Gloves Cleanser: Soap and Water 1 x Per Week/30 Days Discharge Instructions: Gently cleanse wound with antibacterial soap, rinse and pat dry prior to dressing wounds Cleanser: Vashe 5.8 (oz) 1 x Per Week/30 Days Discharge Instructions: Use vashe 5.8 (oz) as directed Prim Dressing: Silvercel 4 1/4x 4 1/4 (in/in) (Generic) 1 x Per Week/30 Days ary Discharge Instructions: Apply Silvercel 4 1/4x 4 1/4 (in/in) as instructed Secondary Dressing: ABD Pad 5x9 (in/in) 1 x Per Week/30 Days Discharge Instructions: Cover with ABD pad Secondary Dressing: Kerlix 4.5 x 4.1  (in/yd) 1 x Per Week/30 Days Discharge Instructions: Apply Kerlix 4.5 x 4.1 (in/yd) as instructed Electronic Signature(s) Signed: 09/11/2022 4:26:39 PM By: Midge Aver MSN RN CNS WTA Signed: 09/11/2022 5:43:48 PM By: Allen Derry PA-C Entered By: Midge Aver on 09/11/2022 08:33:22 Milius, [name] ([id number]) [id number] Page 4 of 7 -------------------------------------------------------------------------------- Problem List Details Patient Name: Date of Service: [name], [name] 09/11/2022 7:45 A M Medical Record Number: [id number] Patient Account Number: [id number] Date of Birth/Sex: Treating RN: [birth year] (62 y.o. [name] Primary Care Provider: Yves Dill Other Clinician: Referring Provider: Treating Provider/Extender: Luan Moore, Neelam Weeks in Treatment: 12 Active Problems ICD-10 Encounter Code Description Active Date MDM Diagnosis I87.333 Chronic venous hypertension (idiopathic) with ulcer and inflammation of 06/19/2022 No Yes bilateral lower extremity L97.312 Non-pressure chronic ulcer of right ankle with fat layer exposed 06/19/2022 No Yes L97.822 Non-pressure chronic ulcer of other part of left lower leg with fat layer exposed3/21/2024 No Yes F17.218 Nicotine dependence, cigarettes, with other nicotine-induced disorders 06/19/2022 No Yes E11.622 Type 2 diabetes mellitus with other skin ulcer 06/19/2022 No Yes Inactive Problems Resolved Problems Electronic Signature(s) Signed: 09/11/2022 8:02:28 AM By: Allen Derry PA-C Entered By: Allen Derry on 09/11/2022 08:02:28 -------------------------------------------------------------------------------- Progress Note Details Patient Name: Date of Service: [name], [name] 09/11/2022 7:45 A M Mangrum, Vraj ([id number]) [id number] Page 5 of 7 Medical Record Number: [id number] Patient Account Number: [id number] Date of Birth/Sex: Treating RN: 1960-07-24 (62 y.o. [name] Primary Care Provider: Yves Dill Other Clinician: Referring Provider: Treating Provider/Extender: Luan Moore, Neelam Weeks in Treatment: 12  Subjective Chief Complaint Information obtained from Patient Bilateral LE Ulcers History of Present Illness (HPI) 06-19-2022 upon evaluation today patient appears to be doing somewhat poorly in regard to a wound that began around Christmas time on the patient's ankle and lower extremities bilaterally. He tells me that he has been having quite significant pain at this point unfortunately. He did go to the emergency department on March 2. However this has been going on since around Christmas. On March 2 he had x-rays that were negative and I did review that today as well. The patient also has what appears to be chronic venous stasis which is an ongoing issue for him here as well and I think that is part of the issue. Right now it is just hard for him to wear anything compression wise although long-term I think he is going to need something. He does smoke currently that something that he needs to stop. Patient does have a history of diabetes mellitus type 2 which is stated to be diet controlled. He also again is a current smoker which I think is something that he needs to get rid of completely. 06-26-2022 upon evaluation today patient appears to be doing well currently in regard to his wounds all things considered he tells me the pain is a little bit better he has been on the correct antibiotic for only 2 days so he is seeing some improvement already this is already a good sign. Fortunately there does not appear to be any signs of infection locally nor systemically which is great news. No fevers, chills, nausea, vomiting, or diarrhea. 07-03-2022 upon evaluation today patient appears to be doing better in regard to his leg the infection is clearing very well and I am actually much more pleased today with where things stand compared to where they  were. Fortunately I do not see any signs of active infection locally or systemically at this time which is great news. No fevers, chills, nausea, vomiting, or diarrhea. 07-10-2022 upon evaluation today patient appears to be doing better I do not see any signs of infection I think he is much better in that regard. Fortunately I do not see any evidence of active infection locally or systemically which is great news. 07-17-2022 upon evaluation today patient actually appears to be doing better in regard to his wounds. He is showing some signs of improvement we actually now able to wrap him and I do think he is now allow me to do some debridement today as well which will also be good news. Fortunately I do not see any evidence of active infection locally nor systemically which is great news. 07-24-2022 upon evaluation patient actually appears to be making good progress here and I am very pleased with where we stand. I do think that he is showing signs of improvement I think that the medication is doing a good job as far as the new epithelial growth is concerned. He is in agreement with continuing with the current regimen the compression wrap has been beneficial the alginate did well but I do think we probably need to change this more than once a week. 07-31-2022 upon evaluation today patient appears to be doing well currently in regard to his wound. He has been tolerating the dressing changes on both legs without complication and overall I am extremely pleased actually with where we stand today. I do not see any signs of active infection 08-07-2022 upon evaluation today patient appears to be doing well currently in regard  to his wounds. He is actually making excellent progress and in general I do feel like that he is really doing quite well. There does not appear to be any signs of active infection locally nor systemically which is great news. No fevers, chills, nausea, vomiting, or diarrhea. 08-14-2022 upon  evaluation today patient appears to be doing well currently in regard to his wounds. He is actually been tolerating the dressing changes without complication. Fortunately there does not appear to be any signs of active infection locally nor systemically which is great news. No fevers, chills, nausea, vomiting, or diarrhea. 09-11-2022 upon evaluation today patient appears to be doing well currently in regard to his wounds he is actually been doing this himself as far as the dressing changes were concerned and seems to have done very well. Fortunately I do not see any signs of active infection locally nor systemically which is great news. Objective Constitutional Well-nourished and well-hydrated in no acute distress. Vitals Time Taken: 7:53 AM, Height: 70 in, Weight: 180 lbs, BMI: 25.8, Temperature: 98.5 F, Pulse: 109 bpm, Respiratory Rate: 18 breaths/min, Blood Pressure: 144/90 mmHg. Respiratory normal breathing without difficulty. Psychiatric this patient is able to make decisions and demonstrates good insight into disease process. Alert and Oriented x 3. pleasant and cooperative. General Notes: Upon inspection patient's wound bed actually showed signs of good granulation and epithelization at this point. Fortunately I do not see any signs of active infection which is good news and in general I do believe that he is making good progress here. I think that the alginate has done well I do believe that the best plan currently is that we may need to go ahead and see about getting the ability for him to be able to change this on his own set up but I think using Tubigrip should do well. That will be better than try to do a single Tubigrip and a compression sock just double up the Tubigrip. Integumentary (Hair, Skin) Fesler, Basem ([id number]) [id number] Page 6 of 7 Wound #1 status is Open. Original cause of wound was Gradually Appeared. The date acquired was: 03/24/2022. The  wound has been in treatment 12 weeks. The wound is located on the Left,Circumferential Lower Leg. The wound measures 2.3cm length x 6cm width x 0.1cm depth; 10.838cm^2 area and 1.084cm^3 volume. There is Fat Layer (Subcutaneous Tissue) exposed. There is a medium amount of serosanguineous drainage noted. There is medium (34-66%) red, pink granulation within the wound bed. There is a medium (34-66%) amount of necrotic tissue within the wound bed including Adherent Slough. Wound #2 status is Healed - Epithelialized. Original cause of wound was Gradually Appeared. The date acquired was: 03/24/2022. The wound has been in treatment 12 weeks. The wound is located on the Right,Lateral Ankle. The wound measures 0cm length x 0cm width x 0cm depth; 0cm^2 area and 0cm^3 volume. There is a medium amount of serosanguineous drainage noted. There is large (67-100%) red granulation within the wound bed. There is no necrotic tissue within the wound bed. 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 Based on what I am seeing I do believe that the patient can use double layer Tubigrip to try to help keep the  swelling under control and see if we can get the last little bit of this healed he is in agreement with that plan. Going to go ahead and see about getting this going form today. 2. Will continue with silver alginate. 3. I am also going to recommend that he should continue to monitor for any signs of infection or worsening if anything changes he knows to contact the office and let me know. We will see patient back for reevaluation in 1 week here in the  clinic. If anything worsens or changes patient will contact our office for additional recommendations. Electronic Signature(s) Signed: 09/11/2022 8:30:46 AM By: Allen Derry PA-C Entered By: Allen Derry on 09/11/2022 08:30:45 -------------------------------------------------------------------------------- SuperBill Details Patient Name: Date of Service: [name], [name] 09/11/2022 Medical Record Number: [id number] Patient Account Number: [id number] Date of Birth/Sex: Treating RN: 1960-08-05 (61 y.o. [name] Primary Care Provider: Yves Dill Other Clinician: Referring Provider: Treating Provider/Extender: Luan Moore, Neelam Weeks in Treatment: 12 Diagnosis Coding ICD-10 Codes Code Description [phone] Chronic venous hypertension (idiopathic) with ulcer and inflammation of bilateral lower extremity L97.312 Non-pressure chronic ulcer of right ankle with fat layer exposed L97.822 Non-pressure chronic ulcer of other part of left lower leg with fat layer exposed F17.218 Nicotine dependence, cigarettes, with other nicotine-induced disorders E11.622 Type 2 diabetes mellitus with other skin ulcer Kirsch, Nil ([id number]) [id number] Page 7 of 7 Facility Procedures : CPT4 Code: [id number] Description: 99213 - WOUND CARE VISIT-LEV 3 EST PT Modifier: Quantity: 1 Physician Procedures : CPT4 Code Description Modifier [id number] - WC PHYS LEVEL 3 - EST PT ICD-10 Diagnosis Description I87.333 Chronic venous hypertension (idiopathic) with ulcer and inflammation of bilateral lower extremity L97.312 Non-pressure chronic ulcer of right  ankle with fat layer exposed L97.822 Non-pressure chronic ulcer of other part of left lower leg with fat layer exposed F17.218 Nicotine dependence, cigarettes, with other nicotine-induced disorders Quantity: 1 Electronic Signature(s) Signed: 09/11/2022 4:26:39 PM By: Midge Aver MSN RN CNS WTA Signed: 09/11/2022 5:43:48 PM By: Allen Derry PA-C Previous Signature: 09/11/2022 8:30:59 AM Version By: Allen Derry PA-C Entered By: Midge Aver on 09/11/2022 08:34:10

## 2022-09-13 NOTE — Progress Notes (Signed)
Gouin, [name] ([id number]) [id number] Page 1 of 10 Visit Report for 09/11/2022 Arrival Information Details Patient Name: Date of Service: [name], [name] 09/11/2022 7:45 A M Medical Record Number: [id number] Patient Account Number: [id number] Date of Birth/Sex: Treating RN: 10/27/1960 (62 y.o. [name] Primary Care Lathan Gieselman: Yves Dill Other Clinician: Referring Ressie Slevin: Treating Lilyahna Sirmon/Extender: Luan Moore, Neelam Weeks in Treatment: 12 Visit Information History Since Last Visit Added or deleted any medications: No Patient Arrived: Ambulatory Has Dressing in Place as Prescribed: Yes Arrival Time: 07:50 Has Compression in Place as Prescribed: Yes Accompanied By: self Pain Present Now: No Transfer Assistance: None Patient Identification Verified: Yes Secondary Verification Process Completed: Yes Patient Requires Transmission-Based Precautions: No Patient Has Alerts: No Electronic Signature(s) Signed: 09/11/2022 4:26:39 PM By: Midge Aver MSN RN CNS WTA Entered By: Midge Aver on 09/11/2022 07:50:38 -------------------------------------------------------------------------------- Clinic Level of Care Assessment Details Patient Name: Date of Service: [name], [name] 09/11/2022 7:45 A M Medical Record Number: [id number] Patient Account Number: [id number] Date of Birth/Sex: Treating RN: [birth year] (62 y.o. [name] Primary Care Dwan Fennel: Yves Dill Other Clinician: Referring Mele Sylvester: Treating Josefine Fuhr/Extender: Luan Moore, Neelam Weeks in Treatment: 12 Clinic Level of Care Assessment Items TOOL 4 Quantity Score X- 1 0 Use when only an EandM is performed on FOLLOW-UP visit ASSESSMENTS - Nursing Assessment / Reassessment X- 1 10 Reassessment of Co-morbidities (includes updates in patient status) X- 1 5 Reassessment of Adherence to Treatment Plan ASSESSMENTS - Wound and Skin A ssessment / Reassessment X - Simple Wound Assessment /  Reassessment - one wound 1 5 []  - 0 Complex Wound Assessment / Reassessment - multiple wounds [name], [name] ([id number]) [id number] Page 2 of 10 []  - 0 Dermatologic / Skin Assessment (not related to wound area) ASSESSMENTS - Focused Assessment []  - 0 Circumferential Edema Measurements - multi extremities []  - 0 Nutritional Assessment / Counseling / Intervention []  - 0 Lower Extremity Assessment (monofilament, tuning fork, pulses) []  - 0 Peripheral Arterial Disease Assessment (using hand held doppler) ASSESSMENTS - Ostomy and/or Continence Assessment and Care []  - 0 Incontinence Assessment and Management []  - 0 Ostomy Care Assessment and Management (repouching, etc.) PROCESS - Coordination of Care X - Simple Patient / Family Education for ongoing care 1 15 []  - 0 Complex (extensive) Patient / Family Education for ongoing care X- 1 10 Staff obtains Chiropractor, Records, T Results / Process Orders est []  - 0 Staff telephones HHA, Nursing Homes / Clarify orders / etc []  - 0 Routine Transfer to another Facility (non-emergent condition) []  - 0 Routine Hospital Admission (non-emergent condition) []  - 0 New Admissions / Manufacturing engineer / Ordering NPWT Apligraf, etc. , []  - 0 Emergency Hospital Admission (emergent condition) X- 1 10 Simple Discharge Coordination []  - 0 Complex (extensive) Discharge Coordination PROCESS - Special Needs []  - 0 Pediatric / Minor Patient Management []  - 0 Isolation Patient Management []  - 0 Hearing / Language / Visual special needs []  - 0 Assessment of Community assistance (transportation, D/C planning, etc.) []  - 0 Additional assistance / Altered mentation []  - 0 Support Surface(s) Assessment (bed, cushion, seat, etc.) INTERVENTIONS - Wound Cleansing / Measurement X - Simple Wound Cleansing - one wound 1 5 []  - 0 Complex Wound Cleansing - multiple wounds X- 1 5 Wound Imaging (photographs - any number of  wounds) []  - 0 Wound Tracing (instead of photographs) X- 1 5 Simple Wound Measurement - one wound []  - 0 Complex Wound Measurement -  multiple wounds INTERVENTIONS - Wound Dressings []  - 0 Small Wound Dressing one or multiple wounds X- 1 15 Medium Wound Dressing one or multiple wounds []  - 0 Large Wound Dressing one or multiple wounds []  - 0 Application of Medications - topical []  - 0 Application of Medications - injection INTERVENTIONS - Miscellaneous []  - 0 External ear exam []  - 0 Specimen Collection (cultures, biopsies, blood, body fluids, etc.) []  - 0 Specimen(s) / Culture(s) sent or taken to Lab for analysis [name], [name] ([id number]) [id number] Page 3 of 10 []  - 0 Patient Transfer (multiple staff / Nurse, adult / Similar devices) []  - 0 Simple Staple / Suture removal (25 or less) []  - 0 Complex Staple / Suture removal (26 or more) []  - 0 Hypo / Hyperglycemic Management (close monitor of Blood Glucose) []  - 0 Ankle / Brachial Index (ABI) - do not check if billed separately X- 1 5 Vital Signs Has the patient been seen at the hospital within the last three years: Yes Total Score: 90 Level Of Care: New/Established - Level 3 Electronic Signature(s) Signed: 09/11/2022 4:26:39 PM By: Midge Aver MSN RN CNS WTA Entered By: Midge Aver on 09/11/2022 08:33:59 -------------------------------------------------------------------------------- Encounter Discharge Information Details Patient Name: Date of Service: [name], [name] 09/11/2022 7:45 A M Medical Record Number: [id number] Patient Account Number: [id number] Date of Birth/Sex: Treating RN: 1960-10-17 (62 y.o. [name] Primary Care Polina Burmaster: Yves Dill Other Clinician: Referring Volanda Mangine: Treating Wash Nienhaus/Extender: Luan Moore, Neelam Weeks in Treatment: 12 Encounter Discharge Information Items Discharge Condition: Stable Ambulatory Status: Ambulatory Discharge Destination:  Home Transportation: Private Auto Accompanied By: self Schedule Follow-up Appointment: Yes Clinical Summary of Care: Electronic Signature(s) Signed: 09/11/2022 4:26:39 PM By: Midge Aver MSN RN CNS WTA Entered By: Midge Aver on 09/11/2022 08:35:09 -------------------------------------------------------------------------------- Lower Extremity Assessment Details Patient Name: Date of Service: [name], [name] 09/11/2022 7:45 A M Medical Record Number: [id number] Patient Account Number: [id number] Date of Birth/Sex: Treating RN: 1961/01/14 (62 y.o. [name] Primary Care Tarius Stangelo: Yves Dill Other Clinician: Referring Ondrea Dow: Treating Aeralyn Barna/Extender: Vania Rea [place], [name] ([id number]) [id number] Page 4 of 10 Weeks in Treatment: 12 Edema Assessment Assessed: [Left: Yes] [Right: Yes] Edema: [Left: No] [Right: No] Calf Left: Right: Point of Measurement: 33 cm From Medial Instep 31 cm 34 cm Ankle Left: Right: Point of Measurement: 12 cm From Medial Instep 21.5 cm 22 cm Knee To Floor Left: Right: From Medial Instep 38.5 cm 38.5 cm Vascular Assessment Pulses: Dorsalis Pedis Palpable: [Left:Yes] [Right:Yes] Electronic Signature(s) Signed: 09/11/2022 4:26:39 PM By: Midge Aver MSN RN CNS WTA Entered By: Midge Aver on 09/11/2022 08:15:49 -------------------------------------------------------------------------------- Multi Wound Chart Details Patient Name: Date of Service: [name], [name] 09/11/2022 7:45 A M Medical Record Number: [id number] Patient Account Number: [id number] Date of Birth/Sex: Treating RN: 03-07-1961 (62 y.o. [name] Primary Care Nadra Hritz: Yves Dill Other Clinician: Referring Karrigan Messamore: Treating Jyrah Blye/Extender: Luan Moore, Neelam Weeks in Treatment: 12 Vital Signs Height(in): 70 Pulse(bpm): 109 Weight(lbs): 180 Blood Pressure(mmHg): 144/90 Body Mass Index(BMI): 25.8 Temperature(F):  98.5 Respiratory Rate(breaths/min): 18 [1:Photos:] [N/A:N/A] Left, Circumferential Lower Leg Right, Lateral Ankle N/A Wound Location: Gradually Appeared Gradually Appeared N/A Wounding Event: Stohr, [name] ([id number]) [id number] Page 5 of 10 Venous Leg Ulcer Venous Leg Ulcer N/A Primary Etiology: Rheumatoid Arthritis Rheumatoid Arthritis N/A Comorbid History: 03/24/2022 03/24/2022 N/A Date Acquired: 12 12 N/A Weeks of Treatment: Open Healed - Epithelialized N/A Wound Status: No No N/A Wound Recurrence: 2.3x6x0.1 0x0x0 N/A  Measurements L x W x D (cm) 10.838 0 N/A A (cm) : rea 1.084 0 N/A Volume (cm) : 88.40% 100.00% N/A % Reduction in A rea: 88.40% 100.00% N/A % Reduction in Volume: Full Thickness Without Exposed Full Thickness Without Exposed N/A Classification: Support Structures Support Structures Medium Medium N/A Exudate A mount: Serosanguineous Serosanguineous N/A Exudate Type: red, brown red, brown N/A Exudate Color: Medium (34-66%) Large (67-100%) N/A Granulation A mount: Red, Pink Red N/A Granulation Quality: Medium (34-66%) None Present (0%) N/A Necrotic A mount: Fat Layer (Subcutaneous Tissue): Yes Fat Layer (Subcutaneous Tissue): No N/A Exposed Structures: None None N/A Epithelialization: Treatment Notes Electronic Signature(s) Signed: 09/11/2022 4:26:39 PM By: Midge Aver MSN RN CNS WTA Entered By: Midge Aver on 09/11/2022 08:15:57 -------------------------------------------------------------------------------- Multi-Disciplinary Care Plan Details Patient Name: Date of Service: [name], [name] 09/11/2022 7:45 A M Medical Record Number: [id number] Patient Account Number: [id number] Date of Birth/Sex: Treating RN: 29-Sep-1960 (62 y.o. [name] Primary Care Pate Aylward: Yves Dill Other Clinician: Referring Estefan Pattison: Treating Rickeya Manus/Extender: Luan Moore, Neelam Weeks in Treatment: 12 Active  Inactive Wound/Skin Impairment Nursing Diagnoses: Impaired tissue integrity Knowledge deficit related to ulceration/compromised skin integrity Goals: Ulcer/skin breakdown will have a volume reduction of 30% by week 4 Date Initiated: 06/19/2022 Date Inactivated: 07/17/2022 Target Resolution Date: 07/17/2022 Goal Status: Met Ulcer/skin breakdown will have a volume reduction of 50% by week 8 Date Initiated: 06/19/2022 Date Inactivated: 08/14/2022 Target Resolution Date: 08/14/2022 Goal Status: Met Ulcer/skin breakdown will have a volume reduction of 80% by week 12 Date Initiated: 06/19/2022 Target Resolution Date: 09/11/2022 Goal Status: Active Ulcer/skin breakdown will heal within 14 weeks Date Initiated: 06/19/2022 Target Resolution Date: 09/25/2022 Goal Status: Active Interventions: Assess patient/caregiver ability to obtain necessary supplies Wion, Aiyden ([id number]) [id number] Page 6 of 10 Assess patient/caregiver ability to perform ulcer/skin care regimen upon admission and as needed Assess ulceration(s) every visit Provide education on ulcer and skin care Treatment Activities: Referred to DME Brunella Wileman for dressing supplies : 06/19/2022 Skin care regimen initiated : 06/19/2022 Notes: Electronic Signature(s) Signed: 09/11/2022 4:26:39 PM By: Midge Aver MSN RN CNS WTA Entered By: Midge Aver on 09/11/2022 08:34:17 -------------------------------------------------------------------------------- Pain Assessment Details Patient Name: Date of Service: [name], [name] 09/11/2022 7:45 A M Medical Record Number: [id number] Patient Account Number: [id number] Date of Birth/Sex: Treating RN: January 01, 1961 (62 y.o. [name] Primary Care Dusan Lipford: Yves Dill Other Clinician: Referring Jagger Demonte: Treating Arbell Wycoff/Extender: Luan Moore, Neelam Weeks in Treatment: 12 Active Problems Location of Pain Severity and Description of Pain Patient Has Paino No Site  Locations Pain Management and Medication Current Pain Management: Electronic Signature(s) Signed: 09/11/2022 4:26:39 PM By: Midge Aver MSN RN CNS WTA Entered By: Midge Aver on 09/11/2022 07:55:22 Smits, [name] ([id number]) [id number] Page 7 of 10 -------------------------------------------------------------------------------- Patient/Caregiver Education Details Patient Name: Date of Service: [name], [name] 6/13/2024andnbsp7:45 A M Medical Record Number: [id number] Patient Account Number: [id number] Date of Birth/Gender: Treating RN: 1960-10-02 (62 y.o. [name] Primary Care Physician: Yves Dill Other Clinician: Referring Physician: Treating Physician/Extender: Leslee Home in Treatment: 12 Education Assessment Education Provided To: Patient Education Topics Provided Wound/Skin Impairment: Handouts: Caring for Your Ulcer Methods: Explain/Verbal Responses: State content correctly Electronic Signature(s) Signed: 09/11/2022 4:26:39 PM By: Midge Aver MSN RN CNS WTA Entered By: Midge Aver on 09/11/2022 08:34:27 -------------------------------------------------------------------------------- Wound Assessment Details Patient Name: Date of Service: [name], [name] 09/11/2022 7:45 A M Medical Record Number: [id number] Patient Account Number: [id number] Date of  Birth/Sex: Treating RN: 30-Sep-1960 (62 y.o. [name] Primary Care Talyah Seder: Yves Dill Other Clinician: Referring Cecil Bixby: Treating Torianna Junio/Extender: Luan Moore, Neelam Weeks in Treatment: 12 Wound Status Wound Number: 1 Primary Etiology: Venous Leg Ulcer Wound Location: Left, Circumferential Lower Leg Wound Status: Open Wounding Event: Gradually Appeared Comorbid History: Rheumatoid Arthritis Date Acquired: 03/24/2022 Weeks Of Treatment: 12 Clustered Wound: No Photos Suto, [name] ([id number]) [id number] Page 8 of 10 Wound  Measurements Length: (cm) 2.3 Width: (cm) 6 Depth: (cm) 0.1 Area: (cm) 10.838 Volume: (cm) 1.084 % Reduction in Area: 88.4% % Reduction in Volume: 88.4% Epithelialization: None Wound Description Classification: Full Thickness Without Exposed Suppor Exudate Amount: Medium Exudate Type: Serosanguineous Exudate Color: red, brown t Structures Foul Odor After Cleansing: No Slough/Fibrino Yes Wound Bed Granulation Amount: Medium (34-66%) Exposed Structure Granulation Quality: Red, Pink Fat Layer (Subcutaneous Tissue) Exposed: Yes Necrotic Amount: Medium (34-66%) Necrotic Quality: Adherent Slough Treatment Notes Wound #1 (Lower Leg) Wound Laterality: Left, Circumferential Cleanser Byram Ancillary Kit - 15 Day Supply Discharge Instruction: Use supplies as instructed; Kit contains: (15) Saline Bullets; (15) 3x3 Gauze; 15 pr Gloves Soap and Water Discharge Instruction: Gently cleanse wound with antibacterial soap, rinse and pat dry prior to dressing wounds Vashe 5.8 (oz) Discharge Instruction: Use vashe 5.8 (oz) as directed Peri-Wound Care Topical Primary Dressing Silvercel 4 1/4x 4 1/4 (in/in) Discharge Instruction: Apply Silvercel 4 1/4x 4 1/4 (in/in) as instructed Secondary Dressing ABD Pad 5x9 (in/in) Discharge Instruction: Cover with ABD pad Kerlix 4.5 x 4.1 (in/yd) Discharge Instruction: Apply Kerlix 4.5 x 4.1 (in/yd) as instructed Secured With Compression Wrap Compression Stockings Add-Ons Electronic Signature(s) Signed: 09/11/2022 4:26:39 PM By: Midge Aver MSN RN CNS WTA Entered By: Midge Aver on 09/11/2022 08:02:28 [place], [name] ([id number]) [id number] Page 9 of 10 -------------------------------------------------------------------------------- Wound Assessment Details Patient Name: Date of Service: [name], [name] 09/11/2022 7:45 A M Medical Record Number: [id number] Patient Account Number: [id number] Date of Birth/Sex: Treating  RN: [birth year] (62 y.o. [name] Primary Care Tucker Minter: Yves Dill Other Clinician: Referring Jelina Paulsen: Treating [name] Fetters/Extender: Luan Moore, Neelam Weeks in Treatment: 12 Wound Status Wound Number: 2 Primary Etiology: Venous Leg Ulcer Wound Location: Right, Lateral Ankle Wound Status: Healed - Epithelialized Wounding Event: Gradually Appeared Comorbid History: Rheumatoid Arthritis Date Acquired: 03/24/2022 Weeks Of Treatment: 12 Clustered Wound: No Photos Wound Measurements Length: (cm) Width: (cm) Depth: (cm) Area: (cm) Volume: (cm) 0 % Reduction in Area: 100% 0 % Reduction in Volume: 100% 0 Epithelialization: None 0 0 Wound Description Classification: Full Thickness Without Exposed Suppor Exudate Amount: Medium Exudate Type: Serosanguineous Exudate Color: red, brown t Structures Foul Odor After Cleansing: No Slough/Fibrino Yes Wound Bed Granulation Amount: Large (67-100%) Exposed Structure Granulation Quality: Red Fat Layer (Subcutaneous Tissue) Exposed: No Necrotic Amount: None Present (0%) Treatment Notes Wound #2 (Ankle) Wound Laterality: Right, Lateral Cleanser Peri-Wound Care Topical Primary Dressing Secondary Dressing [place], [name] ([id number]) [id number] Page 10 of 10 Secured With Compression Wrap Compression Stockings Facilities manager) Signed: 09/11/2022 4:26:39 PM By: Midge Aver MSN RN CNS WTA Entered By: Midge Aver on 09/11/2022 08:15:22 -------------------------------------------------------------------------------- Vitals Details Patient Name: Date of Service: [name], [name] 09/11/2022 7:45 A M Medical Record Number: [id number] Patient Account Number: [id number] Date of Birth/Sex: Treating RN: 1960/12/16 (62 y.o. [name] Primary Care Pride Gonzales: Yves Dill Other Clinician: Referring Leaira Fullam: Treating Maryiah Olvey/Extender: Luan Moore, Neelam Weeks in Treatment: 12 Vital  Signs Time Taken: 07:53 Temperature (F): 98.5 Height (in): 70 Pulse (bpm): 109  Weight (lbs): 180 Respiratory Rate (breaths/min): 18 Body Mass Index (BMI): 25.8 Blood Pressure (mmHg): 144/90 Reference Range: 80 - 120 mg / dl Electronic Signature(s) Signed: 09/11/2022 4:26:39 PM By: Midge Aver MSN RN CNS WTA Entered By: Midge Aver on 09/11/2022 07:55:16

## 2022-09-18 ENCOUNTER — Encounter: Payer: 59 | Admitting: Physician Assistant

## 2022-09-18 DIAGNOSIS — E11622 Type 2 diabetes mellitus with other skin ulcer: Secondary | ICD-10-CM | POA: Diagnosis not present

## 2022-09-18 DIAGNOSIS — M069 Rheumatoid arthritis, unspecified: Secondary | ICD-10-CM | POA: Diagnosis not present

## 2022-09-18 DIAGNOSIS — I87333 Chronic venous hypertension (idiopathic) with ulcer and inflammation of bilateral lower extremity: Secondary | ICD-10-CM | POA: Diagnosis not present

## 2022-09-18 DIAGNOSIS — F17218 Nicotine dependence, cigarettes, with other nicotine-induced disorders: Secondary | ICD-10-CM | POA: Diagnosis not present

## 2022-09-18 DIAGNOSIS — S81802A Unspecified open wound, left lower leg, initial encounter: Secondary | ICD-10-CM | POA: Diagnosis not present

## 2022-09-18 DIAGNOSIS — L97312 Non-pressure chronic ulcer of right ankle with fat layer exposed: Secondary | ICD-10-CM | POA: Diagnosis not present

## 2022-09-18 DIAGNOSIS — L97822 Non-pressure chronic ulcer of other part of left lower leg with fat layer exposed: Secondary | ICD-10-CM | POA: Diagnosis not present

## 2022-09-18 NOTE — Progress Notes (Addendum)
[name] ([id number]) [id number] Page 1 of 9 Visit Report for 09/18/2022 Arrival Information Details Patient Name: Date of Service: [name] 09/18/2022 10:15 A M Medical Record Number: [id number] Patient Account Number: [id number] Date of Birth/Sex: Treating RN: 1961/03/10 (62 y.o. [name] Primary Care [name]: [name] Other Clinician: Referring [name]: Treating [name]/Extender: [name], [name] in Treatment: 13 Visit Information History Since Last Visit Added or deleted any medications: No Patient Arrived: Ambulatory Has Dressing in Place as Prescribed: Yes Arrival Time: 10:18 Pain Present Now: No Accompanied By: self Transfer Assistance: None Patient Identification Verified: Yes Secondary Verification Process Completed: Yes Patient Requires Transmission-Based Precautions: No Patient Has Alerts: No Electronic Signature(s) Signed: 09/18/2022 4:40:27 PM By: [name] Aver MSN RN CNS WTA Entered By: [name] on 09/18/2022 10:19:05 -------------------------------------------------------------------------------- Clinic Level of Care Assessment Details Patient Name: Date of Service: [name] 09/18/2022 10:15 A M Medical Record Number: [id number] Patient Account Number: [id number] Date of Birth/Sex: Treating RN: [birth year] (62 y.o. [name] Primary Care [name]: [name] Other Clinician: Referring [name]: Treating [name]/Extender: [name], [name] in Treatment: 13 Clinic Level of Care Assessment Items TOOL 4 Quantity Score X- 1 0 Use when only an EandM is performed on FOLLOW-UP visit ASSESSMENTS - Nursing Assessment / Reassessment X- 1 10 Reassessment of Co-morbidities (includes updates in patient status) X- 1 5 Reassessment of Adherence to Treatment Plan ASSESSMENTS - Wound and Skin A ssessment / Reassessment X - Simple Wound Assessment / Reassessment - one wound 1 5 []  -  0 Complex Wound Assessment / Reassessment - multiple wounds [name] ([id number]) [id number] Page 2 of 9 []  - 0 Dermatologic / Skin Assessment (not related to wound area) ASSESSMENTS - Focused Assessment []  - 0 Circumferential Edema Measurements - multi extremities []  - 0 Nutritional Assessment / Counseling / Intervention []  - 0 Lower Extremity Assessment (monofilament, tuning fork, pulses) []  - 0 Peripheral Arterial Disease Assessment (using hand held doppler) ASSESSMENTS - Ostomy and/or Continence Assessment and Care []  - 0 Incontinence Assessment and Management []  - 0 Ostomy Care Assessment and Management (repouching, etc.) PROCESS - Coordination of Care X - Simple Patient / Family Education for ongoing care 1 15 []  - 0 Complex (extensive) Patient / Family Education for ongoing care X- 1 10 Staff obtains Chiropractor, Records, T Results / Process Orders est []  - 0 Staff telephones HHA, Nursing Homes / Clarify orders / etc []  - 0 Routine Transfer to another Facility (non-emergent condition) []  - 0 Routine Hospital Admission (non-emergent condition) []  - 0 New Admissions / Manufacturing engineer / Ordering NPWT Apligraf, etc. , []  - 0 Emergency Hospital Admission (emergent condition) X- 1 10 Simple Discharge Coordination []  - 0 Complex (extensive) Discharge Coordination PROCESS - Special Needs []  - 0 Pediatric / Minor Patient Management []  - 0 Isolation Patient Management []  - 0 Hearing / Language / Visual special needs []  - 0 Assessment of Community assistance (transportation, D/C planning, etc.) []  - 0 Additional assistance / Altered mentation []  - 0 Support Surface(s) Assessment (bed, cushion, seat, etc.) INTERVENTIONS - Wound Cleansing / Measurement X - Simple Wound Cleansing - one wound 1 5 []  - 0 Complex Wound Cleansing - multiple wounds X- 1 5 Wound Imaging (photographs - any number of wounds) []  - 0 Wound Tracing (instead  of photographs) X- 1 5 Simple Wound Measurement - one wound []  - 0 Complex Wound Measurement - multiple wounds INTERVENTIONS - Wound Dressings X -  Small Wound Dressing one or multiple wounds 1 10 []  - 0 Medium Wound Dressing one or multiple wounds []  - 0 Large Wound Dressing one or multiple wounds []  - 0 Application of Medications - topical []  - 0 Application of Medications - injection INTERVENTIONS - Miscellaneous []  - 0 External ear exam []  - 0 Specimen Collection (cultures, biopsies, blood, body fluids, etc.) []  - 0 Specimen(s) / Culture(s) sent or taken to Lab for analysis [name] ([id number]) [id number] Page 3 of 9 []  - 0 Patient Transfer (multiple staff / Nurse, adult / Similar devices) []  - 0 Simple Staple / Suture removal (25 or less) []  - 0 Complex Staple / Suture removal (26 or more) []  - 0 Hypo / Hyperglycemic Management (close monitor of Blood Glucose) []  - 0 Ankle / Brachial Index (ABI) - do not check if billed separately X- 1 5 Vital Signs Has the patient been seen at the hospital within the last three years: Yes Total Score: 85 Level Of Care: New/Established - Level 3 Electronic Signature(s) Signed: 09/18/2022 4:40:27 PM By: [name] Aver MSN RN CNS WTA Entered By: [name] on 09/18/2022 10:41:25 -------------------------------------------------------------------------------- Encounter Discharge Information Details Patient Name: Date of Service: [name] 09/18/2022 10:15 A M Medical Record Number: [id number] Patient Account Number: [id number] Date of Birth/Sex: Treating RN: 1960/07/08 (62 y.o. [name] Primary Care Shaunta Oncale: [name] Other Clinician: Referring [name]: Treating [name]/Extender: [name], [name] in Treatment: 13 Encounter Discharge Information Items Discharge Condition: Stable Ambulatory Status: Ambulatory Discharge Destination: Home Transportation: Private  Auto Accompanied By: self Schedule Follow-up Appointment: Yes Clinical Summary of Care: Electronic Signature(s) Signed: 09/18/2022 4:40:27 PM By: [name] Aver MSN RN CNS WTA Entered By: [name] on 09/18/2022 10:42:43 -------------------------------------------------------------------------------- Lower Extremity Assessment Details Patient Name: Date of Service: [name] 09/18/2022 10:15 A M Medical Record Number: [id number] Patient Account Number: [id number] Date of Birth/Sex: Treating RN: 06/22/1960 (62 y.o. [name] Primary Care Kinza Gouveia: [name] Other Clinician: Referring Lynel Forester: Treating Burhanuddin Kohlmann/Extender: Vania Rea [place], [name] ([id number]) [id number] Page 4 of 9 [name] in Treatment: 13 Edema Assessment Assessed: [Left: No] [Right: No] Edema: [Left: N] [Right: o] Calf Left: Right: Point of Measurement: 33 cm From Medial Instep 31 cm Ankle Left: Right: Point of Measurement: 12 cm From Medial Instep 21 cm Knee To Floor Left: Right: From Medial Instep 38.5 cm Vascular Assessment Pulses: Dorsalis Pedis Palpable: [Left:Yes] Electronic Signature(s) Signed: 09/18/2022 4:40:27 PM By: [name] Aver MSN RN CNS WTA Entered By: [name] on 09/18/2022 10:30:31 -------------------------------------------------------------------------------- Multi Wound Chart Details Patient Name: Date of Service: [name] 09/18/2022 10:15 A M Medical Record Number: [id number] Patient Account Number: [id number] Date of Birth/Sex: Treating RN: January 13, 1961 (62 y.o. [name] Primary Care Cong Hightower: [name] Other Clinician: Referring Wilferd Ritson: Treating Clester Chlebowski/Extender: [name], [name] in Treatment: 13 Vital Signs Height(in): 70 Pulse(bpm): 111 Weight(lbs): 180 Blood Pressure(mmHg): 118/76 Body Mass Index(BMI): 25.8 Temperature(F): 98.1 Respiratory Rate(breaths/min): 18 [1:Photos:] [N/A:N/A] Left,  Circumferential Lower Leg N/A N/A Wound Location: Gradually Appeared N/A N/A Wounding Event: Schriner, Jadin ([id number]) [id number] Page 5 of 9 Venous Leg Ulcer N/A N/A Primary Etiology: Rheumatoid Arthritis N/A N/A Comorbid History: 03/24/2022 N/A N/A Date Acquired: 13 N/A N/A [name] of Treatment: Open N/A N/A Wound Status: No N/A N/A Wound Recurrence: 1.8x0.8x0.1 N/A N/A Measurements L x W x D (cm) 1.131 N/A N/A A (cm) : rea 0.113 N/A N/A Volume (cm) : 98.80% N/A  N/A % Reduction in A rea: 98.80% N/A N/A % Reduction in Volume: Full Thickness Without Exposed N/A N/A Classification: Support Structures Medium N/A N/A Exudate A mount: Serosanguineous N/A N/A Exudate Type: red, brown N/A N/A Exudate Color: Medium (34-66%) N/A N/A Granulation A mount: Red, Pink N/A N/A Granulation Quality: Medium (34-66%) N/A N/A Necrotic A mount: Fat Layer (Subcutaneous Tissue): Yes N/A N/A Exposed Structures: None N/A N/A Epithelialization: Treatment Notes Electronic Signature(s) Signed: 09/18/2022 4:40:27 PM By: [name] Aver MSN RN CNS WTA Entered By: [name] on 09/18/2022 10:39:25 -------------------------------------------------------------------------------- Multi-Disciplinary Care Plan Details Patient Name: Date of Service: [name] 09/18/2022 10:15 A M Medical Record Number: [id number] Patient Account Number: [id number] Date of Birth/Sex: Treating RN: 12-19-1960 (62 y.o. [name] Primary Care Binyamin Nelis: [name] Other Clinician: Referring Quinton Voth: Treating Berel Najjar/Extender: [name], [name] in Treatment: 13 Active Inactive Wound/Skin Impairment Nursing Diagnoses: Impaired tissue integrity Knowledge deficit related to ulceration/compromised skin integrity Goals: Ulcer/skin breakdown will have a volume reduction of 30% by week 4 Date Initiated: 06/19/2022 Date Inactivated: 07/17/2022 Target Resolution Date:  07/17/2022 Goal Status: Met Ulcer/skin breakdown will have a volume reduction of 50% by week 8 Date Initiated: 06/19/2022 Date Inactivated: 08/14/2022 Target Resolution Date: 08/14/2022 Goal Status: Met Ulcer/skin breakdown will have a volume reduction of 80% by week 12 Date Initiated: 06/19/2022 Date Inactivated: 09/18/2022 Target Resolution Date: 09/11/2022 Goal Status: Met Ulcer/skin breakdown will heal within 14 [name] Date Initiated: 06/19/2022 Target Resolution Date: 09/25/2022 Goal Status: Active Interventions: Assess patient/caregiver ability to obtain necessary supplies Standish, Zadok ([id number]) [id number] Page 6 of 9 Assess patient/caregiver ability to perform ulcer/skin care regimen upon admission and as needed Assess ulceration(s) every visit Provide education on ulcer and skin care Treatment Activities: Referred to DME Everson Mott for dressing supplies : 06/19/2022 Skin care regimen initiated : 06/19/2022 Notes: Electronic Signature(s) Signed: 09/18/2022 4:40:27 PM By: [name] Aver MSN RN CNS WTA Entered By: [name] on 09/18/2022 10:41:46 -------------------------------------------------------------------------------- Pain Assessment Details Patient Name: Date of Service: [name] 09/18/2022 10:15 A M Medical Record Number: [id number] Patient Account Number: [id number] Date of Birth/Sex: Treating RN: 1961/03/09 (62 y.o. [name] Primary Care Amita Atayde: [name] Other Clinician: Referring Treston Coker: Treating Danaye Sobh/Extender: [name], [name] in Treatment: 13 Active Problems Location of Pain Severity and Description of Pain Patient Has Paino No Site Locations Pain Management and Medication Current Pain Management: Electronic Signature(s) Signed: 09/18/2022 4:40:27 PM By: [name] Aver MSN RN CNS WTA Entered By: [name] on 09/18/2022 10:24:33 Levengood, [name] ([id number]) [id number] Page 7 of  9 -------------------------------------------------------------------------------- Patient/Caregiver Education Details Patient Name: Date of Service: [name] 6/20/2024andnbsp10:15 A M Medical Record Number: [id number] Patient Account Number: [id number] Date of Birth/Gender: Treating RN: Jul 26, 1960 (61 y.o. [name] Primary Care Physician: [name] Other Clinician: Referring Physician: Treating Physician/Extender: Leslee Home in Treatment: 13 Education Assessment Education Provided To: Patient Education Topics Provided Wound/Skin Impairment: Handouts: Caring for Your Ulcer Methods: Explain/Verbal Responses: State content correctly Electronic Signature(s) Signed: 09/18/2022 4:40:27 PM By: [name] Aver MSN RN CNS WTA Entered By: [name] on 09/18/2022 10:41:57 -------------------------------------------------------------------------------- Wound Assessment Details Patient Name: Date of Service: [name] 09/18/2022 10:15 A M Medical Record Number: [id number] Patient Account Number: [id number] Date of Birth/Sex: Treating RN: 10-31-1960 (62 y.o. [name] Primary Care Tammye Kahler: [name] Other Clinician: Referring Carlson Belland: Treating Flecia Shutter/Extender: [name], [name] in Treatment: 13 Wound Status Wound Number:  1 Primary Etiology: Venous Leg Ulcer Wound Location: Left, Circumferential Lower Leg Wound Status: Open Wounding Event: Gradually Appeared Comorbid History: Rheumatoid Arthritis Date Acquired: 03/24/2022 [name] Of Treatment: 13 Clustered Wound: No Photos Scheller, Dastan ([id number]) [id number] Page 8 of 9 Wound Measurements Length: (cm) 1.8 Width: (cm) 0.8 Depth: (cm) 0.1 Area: (cm) 1.131 Volume: (cm) 0.113 % Reduction in Area: 98.8% % Reduction in Volume: 98.8% Epithelialization: None Wound Description Classification: Full Thickness Without Exposed Suppor Exudate Amount:  Medium Exudate Type: Serosanguineous Exudate Color: red, brown t Structures Foul Odor After Cleansing: No Slough/Fibrino Yes Wound Bed Granulation Amount: Medium (34-66%) Exposed Structure Granulation Quality: Red, Pink Fat Layer (Subcutaneous Tissue) Exposed: Yes Necrotic Amount: Medium (34-66%) Necrotic Quality: Adherent Slough Treatment Notes Wound #1 (Lower Leg) Wound Laterality: Left, Circumferential Cleanser Byram Ancillary Kit - 15 Day Supply Discharge Instruction: Use supplies as instructed; Kit contains: (15) Saline Bullets; (15) 3x3 Gauze; 15 pr Gloves Soap and Water Discharge Instruction: Gently cleanse wound with antibacterial soap, rinse and pat dry prior to dressing wounds Vashe 5.8 (oz) Discharge Instruction: Use vashe 5.8 (oz) as directed Peri-Wound Care Topical Primary Dressing Silvercel 4 1/4x 4 1/4 (in/in) Discharge Instruction: Apply Silvercel 4 1/4x 4 1/4 (in/in) as instructed Secondary Dressing ABD Pad 5x9 (in/in) Discharge Instruction: Cover with ABD pad Kerlix 4.5 x 4.1 (in/yd) Discharge Instruction: Apply Kerlix 4.5 x 4.1 (in/yd) as instructed Secured With Tubigrip Size D, 3x10 (in/yd) Discharge Instruction: Double Compression Wrap Compression Stockings Add-Ons Electronic Signature(s) Signed: 09/18/2022 4:40:27 PM By: [name] Aver MSN RN CNS WTA Entered By: [name] on 09/18/2022 10:29:35 Montee, [name] ([id number]) [id number] Page 9 of 9 -------------------------------------------------------------------------------- Vitals Details Patient Name: Date of Service: [name] 09/18/2022 10:15 A M Medical Record Number: [id number] Patient Account Number: [id number] Date of Birth/Sex: Treating RN: 1961-01-26 (62 y.o. [name] Primary Care Markell Sciascia: [name] Other Clinician: Referring Almond Fitzgibbon: Treating Deby Adger/Extender: [name], [name] in Treatment: 13 Vital Signs Time Taken:  10:23 Temperature (F): 98.1 Height (in): 70 Pulse (bpm): 111 Weight (lbs): 180 Respiratory Rate (breaths/min): 18 Body Mass Index (BMI): 25.8 Blood Pressure (mmHg): 118/76 Reference Range: 80 - 120 mg / dl Electronic Signature(s) Signed: 09/18/2022 4:40:27 PM By: [name] Aver MSN RN CNS WTA Entered By: [name] on 09/18/2022 10:24:28

## 2022-09-18 NOTE — Progress Notes (Addendum)
[name], [name] ([id number]) [id number] Page 1 of 7 Visit Report for 09/18/2022 Chief Complaint Document Details Patient Name: Date of Service: [name], [name] 09/18/2022 10:15 A M Medical Record Number: [id number] Patient Account Number: [id number] Date of Birth/Sex: Treating RN: 1960-07-05 (62 y.o. [name] Primary Care Provider: Yves Dill Other Clinician: Referring Provider: Treating Provider/Extender: Luan Moore, Neelam Weeks in Treatment: 13 Information Obtained from: Patient Chief Complaint Bilateral LE Ulcers Electronic Signature(s) Signed: 09/18/2022 10:12:02 AM By: Allen Derry PA-C Entered By: Allen Derry on 09/18/2022 10:12:02 -------------------------------------------------------------------------------- HPI Details Patient Name: Date of Service: [name], [name] 09/18/2022 10:15 A M Medical Record Number: [id number] Patient Account Number: [id number] Date of Birth/Sex: Treating RN: [birth year] (62 y.o. [name] Primary Care Provider: Yves Dill Other Clinician: Referring Provider: Treating Provider/Extender: Luan Moore, Neelam Weeks in Treatment: 13 History of Present Illness HPI Description: 06-19-2022 upon evaluation today patient appears to be doing somewhat poorly in regard to a wound that began around Christmas time on the patient's ankle and lower extremities bilaterally. He tells me that he has been having quite significant pain at this point unfortunately. He did go to the emergency department on March 2. However this has been going on since around Christmas. On March 2 he had x-rays that were negative and I did review that today as well. The patient also has what appears to be chronic venous stasis which is an ongoing issue for him here as well and I think that is part of the issue. Right now it is just hard for him to wear anything compression wise although long-term I think he is going to need something. He does smoke  currently that something that he needs to stop. Patient does have a history of diabetes mellitus type 2 which is stated to be diet controlled. He also again is a current smoker which I think is something that he needs to get rid of completely. 06-26-2022 upon evaluation today patient appears to be doing well currently in regard to his wounds all things considered he tells me the pain is a little bit better he has been on the correct antibiotic for only 2 days so he is seeing some improvement already this is already a good sign. Fortunately there does not appear to be any signs of infection locally nor systemically which is great news. No fevers, chills, nausea, vomiting, or diarrhea. 07-03-2022 upon evaluation today patient appears to be doing better in regard to his leg the infection is clearing very well and I am actually much more pleased today with where things stand compared to where they were. Fortunately I do not see any signs of active infection locally or systemically at this time which is great news. No fevers, chills, nausea, vomiting, or diarrhea. [name], [name] ([id number]) [id number] Page 2 of 7 07-10-2022 upon evaluation today patient appears to be doing better I do not see any signs of infection I think he is much better in that regard. Fortunately I do not see any evidence of active infection locally or systemically which is great news. 07-17-2022 upon evaluation today patient actually appears to be doing better in regard to his wounds. He is showing some signs of improvement we actually now able to wrap him and I do think he is now allow me to do some debridement today as well which will also be good news. Fortunately I do not see any evidence of active infection locally nor systemically which is great news. 07-24-2022  upon evaluation patient actually appears to be making good progress here and I am very pleased with where we stand. I do think that he is  showing signs of improvement I think that the medication is doing a good job as far as the new epithelial growth is concerned. He is in agreement with continuing with the current regimen the compression wrap has been beneficial the alginate did well but I do think we probably need to change this more than once a week. 07-31-2022 upon evaluation today patient appears to be doing well currently in regard to his wound. He has been tolerating the dressing changes on both legs without complication and overall I am extremely pleased actually with where we stand today. I do not see any signs of active infection 08-07-2022 upon evaluation today patient appears to be doing well currently in regard to his wounds. He is actually making excellent progress and in general I do feel like that he is really doing quite well. There does not appear to be any signs of active infection locally nor systemically which is great news. No fevers, chills, nausea, vomiting, or diarrhea. 08-14-2022 upon evaluation today patient appears to be doing well currently in regard to his wounds. He is actually been tolerating the dressing changes without complication. Fortunately there does not appear to be any signs of active infection locally nor systemically which is great news. No fevers, chills, nausea, vomiting, or diarrhea. 09-11-2022 upon evaluation today patient appears to be doing well currently in regard to his wounds he is actually been doing this himself as far as the dressing changes were concerned and seems to have done very well. Fortunately I do not see any signs of active infection locally nor systemically which is great news. 09-18-2022 upon evaluation today patient appears to be doing well currently in regard to his leg ulcers. In fact he just has a very small area on the back of his left leg at this point. He is showing signs of excellent improvement I am actually very pleased with where we stand today. I do not see any  signs of active infection locally nor systemically which is great news. Electronic Signature(s) Signed: 09/18/2022 10:45:11 AM By: Allen Derry PA-C Entered By: Allen Derry on 09/18/2022 10:45:10 -------------------------------------------------------------------------------- Physical Exam Details Patient Name: Date of Service: [name], [name] 09/18/2022 10:15 A M Medical Record Number: [id number] Patient Account Number: [id number] Date of Birth/Sex: Treating RN: 1960-08-26 (62 y.o. [name] Primary Care Provider: Yves Dill Other Clinician: Referring Provider: Treating Provider/Extender: Luan Moore, Neelam Weeks in Treatment: 13 Constitutional Well-nourished and well-hydrated in no acute distress. Respiratory normal breathing without difficulty. Psychiatric this patient is able to make decisions and demonstrates good insight into disease process. Alert and Oriented x 3. pleasant and cooperative. Notes Upon inspection patient's wound bed actually showed signs of good granulation epithelization at this point. Fortunately I do not see any signs of worsening there is no need for sharp debridement I cleared the necrotic tissue away which is saline and gauze and overall he is doing quite well. Electronic Signature(s) Signed: 09/18/2022 10:45:27 AM By: Allen Derry PA-C Entered By: Allen Derry on 09/18/2022 10:45:26 Ryce, [name] ([id number]) [id number] Page 3 of 7 -------------------------------------------------------------------------------- Physician Orders Details Patient Name: Date of Service: [name], [name] 09/18/2022 10:15 A M Medical Record Number: [id number] Patient Account Number: [id number] Date of Birth/Sex: Treating RN: 12-Feb-1961 (62 y.o. [name] Primary Care Provider: Yves Dill Other Clinician: Referring Provider:  Treating Provider/Extender: Luan Moore, Neelam Weeks in Treatment: 13 Verbal / Phone Orders: No Diagnosis  Coding ICD-10 Coding Code Description I87.333 Chronic venous hypertension (idiopathic) with ulcer and inflammation of bilateral lower extremity L97.312 Non-pressure chronic ulcer of right ankle with fat layer exposed L97.822 Non-pressure chronic ulcer of other part of left lower leg with fat layer exposed F17.218 Nicotine dependence, cigarettes, with other nicotine-induced disorders E11.622 Type 2 diabetes mellitus with other skin ulcer Follow-up Appointments Return Appointment in 1 week. Bathing/ Shower/ Hygiene May shower; gently cleanse wound with antibacterial soap, rinse and pat dry prior to dressing wounds No tub bath. Edema Control - Lymphedema / Segmental Compressive Device / Other Bilateral Lower Extremities Tubigrip double layer applied - DD left leg Elevate, Exercise Daily and A void Standing for Long Periods of Time. Elevate legs to the level of the heart and pump ankles as often as possible Elevate leg(s) parallel to the floor when sitting. DO YOUR BEST to sleep in the bed at night. DO NOT sleep in your recliner. Long hours of sitting in a recliner leads to swelling of the legs and/or potential wounds on your backside. Wound Treatment Wound #1 - Lower Leg Wound Laterality: Left, Circumferential Cleanser: Byram Ancillary Kit - 15 Day Supply (Generic) 1 x Per Week/30 Days Discharge Instructions: Use supplies as instructed; Kit contains: (15) Saline Bullets; (15) 3x3 Gauze; 15 pr Gloves Cleanser: Soap and Water 1 x Per Week/30 Days Discharge Instructions: Gently cleanse wound with antibacterial soap, rinse and pat dry prior to dressing wounds Cleanser: Vashe 5.8 (oz) 1 x Per Week/30 Days Discharge Instructions: Use vashe 5.8 (oz) as directed Prim Dressing: Silvercel 4 1/4x 4 1/4 (in/in) (Generic) 1 x Per Week/30 Days ary Discharge Instructions: Apply Silvercel 4 1/4x 4 1/4 (in/in) as instructed Secondary Dressing: ABD Pad 5x9 (in/in) 1 x Per Week/30 Days Discharge  Instructions: Cover with ABD pad Secondary Dressing: Kerlix 4.5 x 4.1 (in/yd) 1 x Per Week/30 Days Discharge Instructions: Apply Kerlix 4.5 x 4.1 (in/yd) as instructed Secured With: Tubigrip Size D, 3x10 (in/yd) 1 x Per Week/30 Days Discharge Instructions: Double Electronic Signature(s) Signed: 09/18/2022 4:40:27 PM By: Midge Aver MSN RN CNS WTA Signed: 09/18/2022 5:52:14 PM By: Lacretia Nicks, [name] ([id number]) 5:52:14 PM By: Allen Derry PA-C [id number] Page 4 of 7 Signed: 09/18/2022 Entered By: Midge Aver on 09/18/2022 10:40:42 -------------------------------------------------------------------------------- Problem List Details Patient Name: Date of Service: [name], [name] 09/18/2022 10:15 A M Medical Record Number: [id number] Patient Account Number: [id number] Date of Birth/Sex: Treating RN: 19-Nov-1960 (62 y.o. [name] Primary Care Provider: Yves Dill Other Clinician: Referring Provider: Treating Provider/Extender: Luan Moore, Neelam Weeks in Treatment: 13 Active Problems ICD-10 Encounter Code Description Active Date MDM Diagnosis I87.333 Chronic venous hypertension (idiopathic) with ulcer and inflammation of 06/19/2022 No Yes bilateral lower extremity L97.312 Non-pressure chronic ulcer of right ankle with fat layer exposed 06/19/2022 No Yes L97.822 Non-pressure chronic ulcer of other part of left lower leg with fat layer exposed3/21/2024 No Yes F17.218 Nicotine dependence, cigarettes, with other nicotine-induced disorders 06/19/2022 No Yes E11.622 Type 2 diabetes mellitus with other skin ulcer 06/19/2022 No Yes Inactive Problems Resolved Problems Electronic Signature(s) Signed: 09/18/2022 4:40:27 PM By: Midge Aver MSN RN CNS WTA Signed: 09/18/2022 5:52:14 PM By: Allen Derry PA-C Previous Signature: 09/18/2022 10:11:57 AM Version By: Allen Derry PA-C Entered By: Midge Aver on 09/18/2022 10:42:08 Washabaugh, [name] ([id number])  [id number] Page 5 of 7 -------------------------------------------------------------------------------- Progress Note Details Patient Name: Date  of Service: [name], [name] 09/18/2022 10:15 A M Medical Record Number: [id number] Patient Account Number: [id number] Date of Birth/Sex: Treating RN: September 30, 1960 (61 y.o. [name] Primary Care Provider: Yves Dill Other Clinician: Referring Provider: Treating Provider/Extender: Luan Moore, Neelam Weeks in Treatment: 13 Subjective Chief Complaint Information obtained from Patient Bilateral LE Ulcers History of Present Illness (HPI) 06-19-2022 upon evaluation today patient appears to be doing somewhat poorly in regard to a wound that began around Christmas time on the patient's ankle and lower extremities bilaterally. He tells me that he has been having quite significant pain at this point unfortunately. He did go to the emergency department on March 2. However this has been going on since around Christmas. On March 2 he had x-rays that were negative and I did review that today as well. The patient also has what appears to be chronic venous stasis which is an ongoing issue for him here as well and I think that is part of the issue. Right now it is just hard for him to wear anything compression wise although long-term I think he is going to need something. He does smoke currently that something that he needs to stop. Patient does have a history of diabetes mellitus type 2 which is stated to be diet controlled. He also again is a current smoker which I think is something that he needs to get rid of completely. 06-26-2022 upon evaluation today patient appears to be doing well currently in regard to his wounds all things considered he tells me the pain is a little bit better he has been on the correct antibiotic for only 2 days so he is seeing some improvement already this is already a good sign. Fortunately there does  not appear to be any signs of infection locally nor systemically which is great news. No fevers, chills, nausea, vomiting, or diarrhea. 07-03-2022 upon evaluation today patient appears to be doing better in regard to his leg the infection is clearing very well and I am actually much more pleased today with where things stand compared to where they were. Fortunately I do not see any signs of active infection locally or systemically at this time which is great news. No fevers, chills, nausea, vomiting, or diarrhea. 07-10-2022 upon evaluation today patient appears to be doing better I do not see any signs of infection I think he is much better in that regard. Fortunately I do not see any evidence of active infection locally or systemically which is great news. 07-17-2022 upon evaluation today patient actually appears to be doing better in regard to his wounds. He is showing some signs of improvement we actually now able to wrap him and I do think he is now allow me to do some debridement today as well which will also be good news. Fortunately I do not see any evidence of active infection locally nor systemically which is great news. 07-24-2022 upon evaluation patient actually appears to be making good progress here and I am very pleased with where we stand. I do think that he is showing signs of improvement I think that the medication is doing a good job as far as the new epithelial growth is concerned. He is in agreement with continuing with the current regimen the compression wrap has been beneficial the alginate did well but I do think we probably need to change this more than once a week. 07-31-2022 upon evaluation today patient appears to be doing well currently in regard to  his wound. He has been tolerating the dressing changes on both legs without complication and overall I am extremely pleased actually with where we stand today. I do not see any signs of active infection 08-07-2022 upon evaluation today  patient appears to be doing well currently in regard to his wounds. He is actually making excellent progress and in general I do feel like that he is really doing quite well. There does not appear to be any signs of active infection locally nor systemically which is great news. No fevers, chills, nausea, vomiting, or diarrhea. 08-14-2022 upon evaluation today patient appears to be doing well currently in regard to his wounds. He is actually been tolerating the dressing changes without complication. Fortunately there does not appear to be any signs of active infection locally nor systemically which is great news. No fevers, chills, nausea, vomiting, or diarrhea. 09-11-2022 upon evaluation today patient appears to be doing well currently in regard to his wounds he is actually been doing this himself as far as the dressing changes were concerned and seems to have done very well. Fortunately I do not see any signs of active infection locally nor systemically which is great news. 09-18-2022 upon evaluation today patient appears to be doing well currently in regard to his leg ulcers. In fact he just has a very small area on the back of his left leg at this point. He is showing signs of excellent improvement I am actually very pleased with where we stand today. I do not see any signs of active infection locally nor systemically which is great news. Objective Sia, Nachum ([id number]) [id number] Page 6 of 7 Constitutional Well-nourished and well-hydrated in no acute distress. Vitals Time Taken: 10:23 AM, Height: 70 in, Weight: 180 lbs, BMI: 25.8, Temperature: 98.1 F, Pulse: 111 bpm, Respiratory Rate: 18 breaths/min, Blood Pressure: 118/76 mmHg. Respiratory normal breathing without difficulty. Psychiatric this patient is able to make decisions and demonstrates good insight into disease process. Alert and Oriented x 3. pleasant and cooperative. General Notes: Upon inspection  patient's wound bed actually showed signs of good granulation epithelization at this point. Fortunately I do not see any signs of worsening there is no need for sharp debridement I cleared the necrotic tissue away which is saline and gauze and overall he is doing quite well. Integumentary (Hair, Skin) Wound #1 status is Open. Original cause of wound was Gradually Appeared. The date acquired was: 03/24/2022. The wound has been in treatment 13 weeks. The wound is located on the Left,Circumferential Lower Leg. The wound measures 1.8cm length x 0.8cm width x 0.1cm depth; 1.131cm^2 area and 0.113cm^3 volume. There is Fat Layer (Subcutaneous Tissue) exposed. There is a medium amount of serosanguineous drainage noted. There is medium (34-66%) red, pink granulation within the wound bed. There is a medium (34-66%) amount of necrotic tissue within the wound bed including Adherent Slough. 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 Bathing/ Shower/ Hygiene: May shower; gently cleanse wound with antibacterial soap, rinse and pat dry prior to dressing wounds No tub bath. Edema Control - Lymphedema / Segmental Compressive Device / Other: Tubigrip double layer applied - DD left leg Elevate, Exercise Daily and  Avoid Standing for Long Periods of Time. Elevate legs to the level of the heart and pump ankles as often as possible Elevate leg(s) parallel to the floor when sitting. DO YOUR BEST to sleep in the bed at night. DO NOT sleep in your recliner. Long hours of sitting in a recliner leads to swelling  of the legs and/or potential wounds on your backside. WOUND #1: - Lower Leg Wound Laterality: Left, Circumferential Cleanser: Byram Ancillary Kit - 15 Day Supply (Generic) 1 x Per Week/30 Days Discharge Instructions: Use supplies as instructed; Kit contains: (15) Saline Bullets; (15) 3x3 Gauze; 15 pr Gloves Cleanser: Soap and Water 1 x Per Week/30 Days Discharge Instructions: Gently cleanse wound with antibacterial soap, rinse and pat dry prior to dressing wounds Cleanser: Vashe 5.8 (oz) 1 x Per Week/30 Days Discharge Instructions: Use vashe 5.8 (oz) as directed Prim Dressing: Silvercel 4 1/4x 4 1/4 (in/in) (Generic) 1 x Per Week/30 Days ary Discharge Instructions: Apply Silvercel 4 1/4x 4 1/4 (in/in) as instructed Secondary Dressing: ABD Pad 5x9 (in/in) 1 x Per Week/30 Days Discharge Instructions: Cover with ABD pad Secondary Dressing: Kerlix 4.5 x 4.1 (in/yd) 1 x Per Week/30 Days Discharge Instructions: Apply Kerlix 4.5 x 4.1 (in/yd) as instructed Secured With: Tubigrip Size D, 3x10 (in/yd) 1 x Per Week/30 Days Discharge Instructions: Double 1. I am good recommend that we have the patient continue to monitor for any evidence of infection or worsening in general I think that we are moving in the right direction here. 2. I am also can recommend we stick with the silver cell which I think is really doing quite well. 3. I am also can recommend that he should utilize the ABD pads and Kerlix to cover along with the Tubigrip size D I think this is doing excellent. We will see patient back for reevaluation in 1 week here in the clinic. If anything worsens or changes patient will contact our office for additional recommendations. Electronic Signature(s) Signed: 09/18/2022 10:45:58 AM By: Allen Derry PA-C Entered By: Allen Derry on 09/18/2022 10:45:57 Bufano, [name] ([id number]) [id number] Page 7 of  7 -------------------------------------------------------------------------------- SuperBill Details Patient Name: Date of Service: [name], [name] 09/18/2022 Medical Record Number: [id number] Patient Account Number: [id number] Date of Birth/Sex: Treating RN: February 02, 1961 (62 y.o. [name] Primary Care Provider: Yves Dill Other Clinician: Referring Provider: Treating Provider/Extender: Luan Moore, Neelam Weeks in Treatment: 13 Diagnosis Coding ICD-10 Codes Code Description [phone] Chronic venous hypertension (idiopathic) with ulcer and inflammation of bilateral lower extremity L97.312 Non-pressure chronic ulcer of right ankle with fat layer exposed L97.822 Non-pressure chronic ulcer of other part of left lower leg with fat layer exposed F17.218 Nicotine dependence, cigarettes, with other nicotine-induced disorders E11.622 Type 2 diabetes mellitus with other skin ulcer Facility Procedures : CPT4 Code: [id number] Description: 99213 - WOUND CARE VISIT-LEV 3 EST PT Modifier: Quantity: 1 Physician Procedures : CPT4 Code Description Modifier [id number] 99213 - WC PHYS LEVEL 3 - EST PT ICD-10 Diagnosis Description I87.333 Chronic venous hypertension (idiopathic) with ulcer and inflammation of bilateral lower extremity L97.312 Non-pressure chronic ulcer of right  ankle with fat layer exposed L97.822 Non-pressure chronic ulcer of other part of left lower leg with fat layer exposed F17.218 Nicotine dependence, cigarettes, with other nicotine-induced disorders Quantity: 1 Electronic Signature(s) Signed: 09/18/2022 10:46:09 AM By: Allen Derry PA-C Entered By: Allen Derry on 09/18/2022 10:46:09

## 2022-09-25 ENCOUNTER — Encounter: Payer: 59 | Admitting: Physician Assistant

## 2022-09-25 DIAGNOSIS — L97312 Non-pressure chronic ulcer of right ankle with fat layer exposed: Secondary | ICD-10-CM | POA: Diagnosis not present

## 2022-09-25 DIAGNOSIS — E11622 Type 2 diabetes mellitus with other skin ulcer: Secondary | ICD-10-CM | POA: Diagnosis not present

## 2022-09-25 DIAGNOSIS — I87332 Chronic venous hypertension (idiopathic) with ulcer and inflammation of left lower extremity: Secondary | ICD-10-CM | POA: Diagnosis not present

## 2022-09-25 DIAGNOSIS — F17218 Nicotine dependence, cigarettes, with other nicotine-induced disorders: Secondary | ICD-10-CM | POA: Diagnosis not present

## 2022-09-25 DIAGNOSIS — M069 Rheumatoid arthritis, unspecified: Secondary | ICD-10-CM | POA: Diagnosis not present

## 2022-09-25 DIAGNOSIS — L97822 Non-pressure chronic ulcer of other part of left lower leg with fat layer exposed: Secondary | ICD-10-CM | POA: Diagnosis not present

## 2022-09-25 DIAGNOSIS — I87333 Chronic venous hypertension (idiopathic) with ulcer and inflammation of bilateral lower extremity: Secondary | ICD-10-CM | POA: Diagnosis not present

## 2022-09-25 NOTE — Progress Notes (Signed)
Albo, [name] ([id number]) [id number] Page 1 of 9 Visit Report for 09/25/2022 Arrival Information Details Patient Name: Date of Service: [name] 09/25/2022 1:15 PM Medical Record Number: [id number] Patient Account Number: [id number] Date of Birth/Sex: Treating RN: [birth year][name] (62 y.o. [name], [name] Primary Care [name]: [name] Other Clinician: Referring [name]: Treating [name]/Extender: [name], [name] in Treatment: 14 Visit Information History Since Last Visit Added or deleted any medications: No Patient Arrived: Ambulatory Has Dressing in Place as Prescribed: Yes Arrival Time: 13:20 Pain Present Now: No Accompanied By: self Transfer Assistance: None Patient Identification Verified: Yes Secondary Verification Process Completed: Yes Patient Requires Transmission-Based Precautions: No Patient Has Alerts: No Electronic Signature(s) Signed: 09/25/2022 4:37:47 PM By: Elliot Gurney, BSN, RN, CWS, Kim RN, BSN Entered By: Elliot Gurney, BSN, RN, CWS, Kim on 09/25/2022 13:20:41 -------------------------------------------------------------------------------- Clinic Level of Care Assessment Details Patient Name: Date of Service: [name] 09/25/2022 1:15 PM Medical Record Number: [id number] Patient Account Number: [id number] Date of Birth/Sex: Treating RN: [name] (62 y.o. [name] Primary Care Sally Reimers: [name] Other Clinician: Referring Carlas Vandyne: Treating Hamda Klutts/Extender: [name], [name] in Treatment: 14 Clinic Level of Care Assessment Items TOOL 4 Quantity Score []  - 0 Use when only an EandM is performed on FOLLOW-UP visit ASSESSMENTS - Nursing Assessment / Reassessment X- 1 10 Reassessment of Co-morbidities (includes updates in patient status) X- 1 5 Reassessment of Adherence to Treatment Plan ASSESSMENTS - Wound and Skin A ssessment / Reassessment X - Simple Wound Assessment / Reassessment - one wound 1 5 []  -  0 Complex Wound Assessment / Reassessment - multiple wounds [name], [name] ([id number]) [id number] Page 2 of 9 []  - 0 Dermatologic / Skin Assessment (not related to wound area) ASSESSMENTS - Focused Assessment []  - 0 Circumferential Edema Measurements - multi extremities []  - 0 Nutritional Assessment / Counseling / Intervention []  - 0 Lower Extremity Assessment (monofilament, tuning fork, pulses) []  - 0 Peripheral Arterial Disease Assessment (using hand held doppler) ASSESSMENTS - Ostomy and/or Continence Assessment and Care []  - 0 Incontinence Assessment and Management []  - 0 Ostomy Care Assessment and Management (repouching, etc.) PROCESS - Coordination of Care X - Simple Patient / Family Education for ongoing care 1 15 []  - 0 Complex (extensive) Patient / Family Education for ongoing care X- 1 10 Staff obtains Chiropractor, Records, T Results / Process Orders est []  - 0 Staff telephones HHA, Nursing Homes / Clarify orders / etc []  - 0 Routine Transfer to another Facility (non-emergent condition) []  - 0 Routine Hospital Admission (non-emergent condition) []  - 0 New Admissions / Manufacturing engineer / Ordering NPWT Apligraf, etc. , []  - 0 Emergency Hospital Admission (emergent condition) []  - 0 Simple Discharge Coordination []  - 0 Complex (extensive) Discharge Coordination PROCESS - Special Needs []  - 0 Pediatric / Minor Patient Management []  - 0 Isolation Patient Management []  - 0 Hearing / Language / Visual special needs []  - 0 Assessment of Community assistance (transportation, D/C planning, etc.) []  - 0 Additional assistance / Altered mentation []  - 0 Support Surface(s) Assessment (bed, cushion, seat, etc.) INTERVENTIONS - Wound Cleansing / Measurement X - Simple Wound Cleansing - one wound 1 5 []  - 0 Complex Wound Cleansing - multiple wounds X- 1 5 Wound Imaging (photographs - any number of wounds) []  - 0 Wound Tracing (instead  of photographs) X- 1 5 Simple Wound Measurement - one wound []  - 0 Complex Wound Measurement - multiple wounds INTERVENTIONS - Wound  Dressings []  - 0 Small Wound Dressing one or multiple wounds []  - 0 Medium Wound Dressing one or multiple wounds X- 1 20 Large Wound Dressing one or multiple wounds []  - 0 Application of Medications - topical []  - 0 Application of Medications - injection INTERVENTIONS - Miscellaneous []  - 0 External ear exam []  - 0 Specimen Collection (cultures, biopsies, blood, body fluids, etc.) []  - 0 Specimen(s) / Culture(s) sent or taken to Lab for analysis [name], [name] ([id number]) [id number] Page 3 of 9 []  - 0 Patient Transfer (multiple staff / Nurse, adult / Similar devices) []  - 0 Simple Staple / Suture removal (25 or less) []  - 0 Complex Staple / Suture removal (26 or more) []  - 0 Hypo / Hyperglycemic Management (close monitor of Blood Glucose) []  - 0 Ankle / Brachial Index (ABI) - do not check if billed separately X- 1 5 Vital Signs Has the patient been seen at the hospital within the last three years: Yes Total Score: 85 Level Of Care: New/Established - Level 3 Electronic Signature(s) Signed: 09/25/2022 4:37:47 PM By: Elliot Gurney, BSN, RN, CWS, Kim RN, BSN Entered By: Elliot Gurney, BSN, RN, CWS, Kim on 09/25/2022 13:56:32 -------------------------------------------------------------------------------- Encounter Discharge Information Details Patient Name: Date of Service: [name] 09/25/2022 1:15 PM Medical Record Number: [id number] Patient Account Number: [id number] Date of Birth/Sex: Treating RN: [name][birth year] (62 y.o. [name] Primary Care [name]: [name] Other Clinician: Referring Danil Wedge: Treating Sirron Francesconi/Extender: [name], [name] in Treatment: 14 Encounter Discharge Information Items Discharge Condition: Stable Ambulatory Status: Ambulatory Discharge Destination: Home Transportation: Private  Auto Accompanied By: self Schedule Follow-up Appointment: Yes Clinical Summary of Care: Electronic Signature(s) Signed: 09/25/2022 4:37:47 PM By: Elliot Gurney, BSN, RN, CWS, Kim RN, BSN Entered By: Elliot Gurney, BSN, RN, CWS, Kim on 09/25/2022 14:05:59 -------------------------------------------------------------------------------- Lower Extremity Assessment Details Patient Name: Date of Service: [name] 09/25/2022 1:15 PM Medical Record Number: [id number] Patient Account Number: [id number] Date of Birth/Sex: Treating RN: [name][birth year] (62 y.o. [name] Primary Care Kattie Santoyo: [name] Other Clinician: Referring Dalinda Heidt: Treating Anatalia Kronk/Extender: Vania Rea Monroe, [name] ([id number]) [id number] Page 4 of 9 [name] in Treatment: 14 Edema Assessment Assessed: [Left: No] [Right: No] [Left: Edema] [Right: :] Calf Left: Right: Point of Measurement: 33 cm From Medial Instep 31.4 cm Ankle Left: Right: Point of Measurement: 12 cm From Medial Instep 23 cm Knee To Floor Left: Right: From Medial Instep 38.5 cm Vascular Assessment Pulses: Dorsalis Pedis Palpable: [Left:Yes] Electronic Signature(s) Signed: 09/25/2022 4:37:47 PM By: Elliot Gurney, BSN, RN, CWS, Kim RN, BSN Entered By: Elliot Gurney, BSN, RN, CWS, Kim on 09/25/2022 13:31:39 -------------------------------------------------------------------------------- Multi Wound Chart Details Patient Name: Date of Service: [name] 09/25/2022 1:15 PM Medical Record Number: [id number] Patient Account Number: [id number] Date of Birth/Sex: Treating RN: [name] (62 y.o. [name] Primary Care Lexus Shampine: [name] Other Clinician: Referring Khing Belcher: Treating Analeigha Nauman/Extender: [name], [name] in Treatment: 14 Vital Signs Height(in): 70 Pulse(bpm): 108 Weight(lbs): 180 Blood Pressure(mmHg): 127/67 Body Mass Index(BMI): 25.8 Temperature(F): 97.9 Respiratory Rate(breaths/min):  16 [1:Photos:] [N/A:N/A] Left, Circumferential Lower Leg N/A N/A Wound Location: Gradually Appeared N/A N/A Wounding Event: [name], [name] ([id number]) [id number] Page 5 of 9 Venous Leg Ulcer N/A N/A Primary Etiology: Rheumatoid Arthritis N/A N/A Comorbid History: 03/24/2022 N/A N/A Date Acquired: 14 N/A N/A [name] of Treatment: Open N/A N/A Wound Status: No N/A N/A Wound Recurrence: 2.1x1x0.1 N/A N/A Measurements L x W x D (cm) 1.649 N/A N/A A (cm) :  rea 0.165 N/A N/A Volume (cm) : 98.20% N/A N/A % Reduction in A rea: 98.20% N/A N/A % Reduction in Volume: Full Thickness Without Exposed N/A N/A Classification: Support Structures Medium N/A N/A Exudate A mount: Serosanguineous N/A N/A Exudate Type: red, brown N/A N/A Exudate Color: Medium (34-66%) N/A N/A Granulation A mount: Red, Pink N/A N/A Granulation Quality: Medium (34-66%) N/A N/A Necrotic A mount: Fat Layer (Subcutaneous Tissue): Yes N/A N/A Exposed Structures: None N/A N/A Epithelialization: Treatment Notes Electronic Signature(s) Signed: 09/25/2022 4:37:47 PM By: Elliot Gurney, BSN, RN, CWS, Kim RN, BSN Entered By: Elliot Gurney, BSN, RN, CWS, Kim on 09/25/2022 13:55:15 -------------------------------------------------------------------------------- Multi-Disciplinary Care Plan Details Patient Name: Date of Service: [name] 09/25/2022 1:15 PM Medical Record Number: [id number] Patient Account Number: [id number] Date of Birth/Sex: Treating RN: [birth year] (62 y.o. [name], [name] Primary Care Klayton Monie: [name] Other Clinician: Referring Aaradhya Kysar: Treating Nolita Kutter/Extender: [name], [name] in Treatment: 14 Active Inactive Wound/Skin Impairment Nursing Diagnoses: Impaired tissue integrity Knowledge deficit related to ulceration/compromised skin integrity Goals: Ulcer/skin breakdown will have a volume reduction of 30% by week 4 Date Initiated: 06/19/2022 Date  Inactivated: 07/17/2022 Target Resolution Date: 07/17/2022 Goal Status: Met Ulcer/skin breakdown will have a volume reduction of 50% by week 8 Date Initiated: 06/19/2022 Date Inactivated: 08/14/2022 Target Resolution Date: 08/14/2022 Goal Status: Met Ulcer/skin breakdown will have a volume reduction of 80% by week 12 Date Initiated: 06/19/2022 Date Inactivated: 09/18/2022 Target Resolution Date: 09/11/2022 Goal Status: Met Ulcer/skin breakdown will heal within 14 [name] Date Initiated: 06/19/2022 Target Resolution Date: 09/25/2022 Goal Status: Active Interventions: Assess patient/caregiver ability to obtain necessary supplies Saline, Sinclair ([id number]) [id number] Page 6 of 9 Assess patient/caregiver ability to perform ulcer/skin care regimen upon admission and as needed Assess ulceration(s) every visit Provide education on ulcer and skin care Treatment Activities: Referred to DME Najiyah Paris for dressing supplies : 06/19/2022 Skin care regimen initiated : 06/19/2022 Notes: Electronic Signature(s) Signed: 09/25/2022 4:37:47 PM By: Elliot Gurney, BSN, RN, CWS, Kim RN, BSN Entered By: Elliot Gurney, BSN, RN, CWS, Kim on 09/25/2022 14:05:03 -------------------------------------------------------------------------------- Pain Assessment Details Patient Name: Date of Service: [name] 09/25/2022 1:15 PM Medical Record Number: [id number] Patient Account Number: [id number] Date of Birth/Sex: Treating RN: [birth year] [name] (62 y.o. [name] Primary Care Zoe Nordin: [name] Other Clinician: Referring Jermani Eberlein: Treating Monai Hindes/Extender: [name], [name] in Treatment: 14 Active Problems Location of Pain Severity and Description of Pain Patient Has Paino No Site Locations Pain Management and Medication Current Pain Management: Notes patient denies pain at this time Electronic Signature(s) Signed: 09/25/2022 4:37:47 PM By: Elliot Gurney, BSN, RN, CWS, Kim RN, BSN Entered By:  Elliot Gurney, BSN, RN, CWS, Kim on 09/25/2022 13:23:56 Melder, [name] ([id number]) [id number] Page 7 of 9 -------------------------------------------------------------------------------- Patient/Caregiver Education Details Patient Name: Date of Service: [name] 6/27/2024andnbsp1:15 PM Medical Record Number: [id number] Patient Account Number: [id number] Date of Birth/Gender: Treating RN: [birth year][name] (62 y.o. [name] Primary Care Physician: [name] Other Clinician: Referring Physician: Treating Physician/Extender: Leslee Home in Treatment: 14 Education Assessment Education Provided To: Patient Education Topics Provided Wound/Skin Impairment: Handouts: Caring for Your Ulcer Methods: Demonstration, Explain/Verbal Responses: State content correctly Electronic Signature(s) Signed: 09/25/2022 4:37:47 PM By: Elliot Gurney, BSN, RN, CWS, Kim RN, BSN Entered By: Elliot Gurney, BSN, RN, CWS, Kim on 09/25/2022 14:05:18 -------------------------------------------------------------------------------- Wound Assessment Details Patient Name: Date of Service: [name] 09/25/2022 1:15 PM Medical Record Number: [id number] Patient Account Number: [id number] Date of Birth/Sex: Treating RN:  [birth year] [name] (62 y.o. [name] Primary Care Yamileth Hayse: [name] Other Clinician: Referring Kennedee Kitzmiller: Treating Logann Whitebread/Extender: [name], [name] in Treatment: 14 Wound Status Wound Number: 1 Primary Etiology: Venous Leg Ulcer Wound Location: Left, Circumferential Lower Leg Wound Status: Open Wounding Event: Gradually Appeared Comorbid History: Rheumatoid Arthritis Date Acquired: 03/24/2022 [name] Of Treatment: 14 Clustered Wound: No Photos [name], [name] ([id number]) [id number] Page 8 of 9 Wound Measurements Length: (cm) 2.1 Width: (cm) 1 Depth: (cm) 0.1 Area: (cm) 1.649 Volume: (cm) 0.165 % Reduction in Area: 98.2% %  Reduction in Volume: 98.2% Epithelialization: None Tunneling: No Undermining: No Wound Description Classification: Full Thickness Without Exposed Suppor Exudate Amount: Medium Exudate Type: Serosanguineous Exudate Color: red, brown t Structures Foul Odor After Cleansing: No Slough/Fibrino Yes Wound Bed Granulation Amount: Medium (34-66%) Exposed Structure Granulation Quality: Red, Pink Fat Layer (Subcutaneous Tissue) Exposed: Yes Necrotic Amount: Medium (34-66%) Necrotic Quality: Adherent Slough Treatment Notes Wound #1 (Lower Leg) Wound Laterality: Left, Circumferential Cleanser Soap and Water Discharge Instruction: Gently cleanse wound with antibacterial soap, rinse and pat dry prior to dressing wounds Vashe 5.8 (oz) Discharge Instruction: Use vashe 5.8 (oz) as directed Peri-Wound Care Topical Primary Dressing Promogran Matrix 4.34 (in) Discharge Instruction: Moisten w/normal saline or sterile water; Cover wound as directed. Do not remove from wound bed. Secondary Dressing ABD Pad 5x9 (in/in) Discharge Instruction: Cover with ABD pad Kerlix 4.5 x 4.1 (in/yd) Discharge Instruction: Apply Kerlix 4.5 x 4.1 (in/yd) as instructed Secured With Tubigrip Size D, 3x10 (in/yd) Discharge Instruction: Double Compression Wrap Compression Stockings Add-Ons Electronic Signature(s) Signed: 09/25/2022 4:37:47 PM By: Elliot Gurney, BSN, RN, CWS, Kim RN, BSN Entered By: Elliot Gurney, BSN, RN, CWS, Kim on 09/25/2022 13:30:33 Bokhari, [name] ([id number]) [id number] Page 9 of 9 -------------------------------------------------------------------------------- Vitals Details Patient Name: Date of Service: [name] 09/25/2022 1:15 PM Medical Record Number: [id number] Patient Account Number: [id number] Date of Birth/Sex: Treating RN: [birth year][name] (62 y.o. [name], [name] Primary Care Estelle Skibicki: [name] Other Clinician: Referring Yashvi Jasinski: Treating Timya Trimmer/Extender: [name], [name] in Treatment: 14 Vital Signs Time Taken: 13:20 Temperature (F): 97.9 Height (in): 70 Pulse (bpm): 108 Weight (lbs): 180 Respiratory Rate (breaths/min): 16 Body Mass Index (BMI): 25.8 Blood Pressure (mmHg): 127/67 Reference Range: 80 - 120 mg / dl Electronic Signature(s) Signed: 09/25/2022 4:37:47 PM By: Elliot Gurney, BSN, RN, CWS, Kim RN, BSN Entered By: Elliot Gurney, BSN, RN, CWS, Kim on 09/25/2022 13:23:40

## 2022-09-25 NOTE — Progress Notes (Addendum)
[name], [name] ([id number]) [id number] Page 1 of 7 Visit Report for 09/25/2022 Chief Complaint Document Details Patient Name: Date of Service: [name], [name] 09/25/2022 1:15 PM Medical Record Number: [id number] Patient Account Number: [id number] Date of Birth/Sex: Treating RN: [birth year] (62 y.o. [name] Primary Care Provider: Yves Dill Other Clinician: Referring Provider: Treating Provider/Extender: Luan Moore, Neelam Weeks in Treatment: 14 Information Obtained from: Patient Chief Complaint Bilateral LE Ulcers Electronic Signature(s) Signed: 09/25/2022 1:28:09 PM By: Allen Derry PA-C Entered By: Allen Derry on 09/25/2022 13:28:09 -------------------------------------------------------------------------------- HPI Details Patient Name: Date of Service: [name], [name] 09/25/2022 1:15 PM Medical Record Number: [id number] Patient Account Number: [id number] Date of Birth/Sex: Treating RN: [birth year] (62 y.o. [name] Primary Care Provider: Yves Dill Other Clinician: Referring Provider: Treating Provider/Extender: Luan Moore, Neelam Weeks in Treatment: 14 History of Present Illness HPI Description: 06-19-2022 upon evaluation today patient appears to be doing somewhat poorly in regard to a wound that began around Christmas time on the patient's ankle and lower extremities bilaterally. He tells me that he has been having quite significant pain at this point unfortunately. He did go to the emergency department on March 2. However this has been going on since around Christmas. On March 2 he had x-rays that were negative and I did review that today as well. The patient also has what appears to be chronic venous stasis which is an ongoing issue for him here as well and I think that is part of the issue. Right now it is just hard for him to wear anything compression wise although long-term I think he is going to need something. He does smoke  currently that something that he needs to stop. Patient does have a history of diabetes mellitus type 2 which is stated to be diet controlled. He also again is a current smoker which I think is something that he needs to get rid of completely. 06-26-2022 upon evaluation today patient appears to be doing well currently in regard to his wounds all things considered he tells me the pain is a little bit better he has been on the correct antibiotic for only 2 days so he is seeing some improvement already this is already a good sign. Fortunately there does not appear to be any signs of infection locally nor systemically which is great news. No fevers, chills, nausea, vomiting, or diarrhea. 07-03-2022 upon evaluation today patient appears to be doing better in regard to his leg the infection is clearing very well and I am actually much more pleased today with where things stand compared to where they were. Fortunately I do not see any signs of active infection locally or systemically at this time which is great news. No fevers, chills, nausea, vomiting, or diarrhea. Kurt, [name] ([id number]) [id number] Page 2 of 7 07-10-2022 upon evaluation today patient appears to be doing better I do not see any signs of infection I think he is much better in that regard. Fortunately I do not see any evidence of active infection locally or systemically which is great news. 07-17-2022 upon evaluation today patient actually appears to be doing better in regard to his wounds. He is showing some signs of improvement we actually now able to wrap him and I do think he is now allow me to do some debridement today as well which will also be good news. Fortunately I do not see any evidence of active infection locally nor systemically which is great news. 07-24-2022 upon evaluation  patient actually appears to be making good progress here and I am very pleased with where we stand. I do think that he is  showing signs of improvement I think that the medication is doing a good job as far as the new epithelial growth is concerned. He is in agreement with continuing with the current regimen the compression wrap has been beneficial the alginate did well but I do think we probably need to change this more than once a week. 07-31-2022 upon evaluation today patient appears to be doing well currently in regard to his wound. He has been tolerating the dressing changes on both legs without complication and overall I am extremely pleased actually with where we stand today. I do not see any signs of active infection 08-07-2022 upon evaluation today patient appears to be doing well currently in regard to his wounds. He is actually making excellent progress and in general I do feel like that he is really doing quite well. There does not appear to be any signs of active infection locally nor systemically which is great news. No fevers, chills, nausea, vomiting, or diarrhea. 08-14-2022 upon evaluation today patient appears to be doing well currently in regard to his wounds. He is actually been tolerating the dressing changes without complication. Fortunately there does not appear to be any signs of active infection locally nor systemically which is great news. No fevers, chills, nausea, vomiting, or diarrhea. 09-11-2022 upon evaluation today patient appears to be doing well currently in regard to his wounds he is actually been doing this himself as far as the dressing changes were concerned and seems to have done very well. Fortunately I do not see any signs of active infection locally nor systemically which is great news. 09-18-2022 upon evaluation today patient appears to be doing well currently in regard to his leg ulcers. In fact he just has a very small area on the back of his left leg at this point. He is showing signs of excellent improvement I am actually very pleased with where we stand today. I do not see any  signs of active infection locally nor systemically which is great news. 09-25-2022 upon evaluation today patient is doing well his wound is almost completely healed this is significantly smaller and looks to be doing great. Fortunately I do not see any evidence of infection at this time. Electronic Signature(s) Signed: 09/25/2022 6:14:34 PM By: Allen Derry PA-C Entered By: Allen Derry on 09/25/2022 18:14:34 -------------------------------------------------------------------------------- Physical Exam Details Patient Name: Date of Service: [name], [name] 09/25/2022 1:15 PM Medical Record Number: [id number] Patient Account Number: [id number] Date of Birth/Sex: Treating RN: 1960-06-19 (62 y.o. [name] Primary Care Provider: Yves Dill Other Clinician: Referring Provider: Treating Provider/Extender: Luan Moore, Neelam Weeks in Treatment: 14 Constitutional Well-nourished and well-hydrated in no acute distress. Respiratory normal breathing without difficulty. Psychiatric this patient is able to make decisions and demonstrates good insight into disease process. Alert and Oriented x 3. pleasant and cooperative. Notes Upon inspection patient's wound bed actually showed signs of good granulation epithelization at this point. Fortunately not see any evidence of worsening overall and good news is I do believe that we are making good headway towards complete closure. Electronic Signature(s) Signed: 09/25/2022 6:14:52 PM By: Allen Derry PA-C Entered By: Allen Derry on 09/25/2022 18:14:51 Julia, [name] ([id number]) [id number] Page 3 of 7 -------------------------------------------------------------------------------- Physician Orders Details Patient Name: Date of Service: [name], [name] 09/25/2022 1:15 PM Medical Record Number: [id number] Patient Account  Number: [id number] Date of Birth/Sex: Treating RN: 12/10/1960 (62 y.o. [name], [name] Primary Care Provider: Yves Dill Other Clinician: Referring Provider: Treating Provider/Extender: Luan Moore, Neelam Weeks in Treatment: [id number] Verbal / Phone Orders: No Diagnosis Coding ICD-10 Coding Code Description I87.333 Chronic venous hypertension (idiopathic) with ulcer and inflammation of bilateral lower extremity L97.312 Non-pressure chronic ulcer of right ankle with fat layer exposed L97.822 Non-pressure chronic ulcer of other part of left lower leg with fat layer exposed F17.218 Nicotine dependence, cigarettes, with other nicotine-induced disorders E11.622 Type 2 diabetes mellitus with other skin ulcer Follow-up Appointments Return Appointment in 1 week. Bathing/ Shower/ Hygiene May shower; gently cleanse wound with antibacterial soap, rinse and pat dry prior to dressing wounds No tub bath. Edema Control - Lymphedema / Segmental Compressive Device / Other Bilateral Lower Extremities Tubigrip double layer applied - DD left leg Elevate, Exercise Daily and A void Standing for Long Periods of Time. Elevate legs to the level of the heart and pump ankles as often as possible Elevate leg(s) parallel to the floor when sitting. DO YOUR BEST to sleep in the bed at night. DO NOT sleep in your recliner. Long hours of sitting in a recliner leads to swelling of the legs and/or potential wounds on your backside. Wound Treatment Wound #1 - Lower Leg Wound Laterality: Left, Circumferential Cleanser: Soap and Water 1 x Per Week/30 Days Discharge Instructions: Gently cleanse wound with antibacterial soap, rinse and pat dry prior to dressing wounds Cleanser: Vashe 5.8 (oz) 1 x Per Week/30 Days Discharge Instructions: Use vashe 5.8 (oz) as directed Prim Dressing: Promogran Matrix 4.34 (in) 1 x Per Week/30 Days ary Discharge Instructions: Moisten w/normal saline or sterile water; Cover wound as directed. Do not remove from wound bed. Secondary Dressing: ABD Pad 5x9 (in/in) 1 x Per Week/30 Days Discharge  Instructions: Cover with ABD pad Secondary Dressing: Kerlix 4.5 x 4.1 (in/yd) 1 x Per Week/30 Days Discharge Instructions: Apply Kerlix 4.5 x 4.1 (in/yd) as instructed Secured With: Tubigrip Size D, 3x10 (in/yd) 1 x Per Week/30 Days Discharge Instructions: Double Electronic Signature(s) Signed: 09/25/2022 4:37:47 PM By: Elliot Gurney, BSN, RN, CWS, Kim RN, BSN Signed: 09/26/2022 1:40:48 PM By: Allen Derry PA-C Entered By: Elliot Gurney, BSN, RN, CWS, Kim on 09/25/2022 13:56:09 Ingram, [name] ([id number]) [id number] Page 4 of 7 -------------------------------------------------------------------------------- Problem List Details Patient Name: Date of Service: [name], [name] 09/25/2022 1:15 PM Medical Record Number: [id number] Patient Account Number: [id number] Date of Birth/Sex: Treating RN: 1961-03-24 (62 y.o. [name], [name] Primary Care Provider: Yves Dill Other Clinician: Referring Provider: Treating Provider/Extender: Luan Moore, Neelam Weeks in Treatment: 14 Active Problems ICD-10 Encounter Code Description Active Date MDM Diagnosis I87.333 Chronic venous hypertension (idiopathic) with ulcer and inflammation of 06/19/2022 No Yes bilateral lower extremity L97.312 Non-pressure chronic ulcer of right ankle with fat layer exposed 06/19/2022 No Yes L97.822 Non-pressure chronic ulcer of other part of left lower leg with fat layer exposed3/21/2024 No Yes F17.218 Nicotine dependence, cigarettes, with other nicotine-induced disorders 06/19/2022 No Yes E11.622 Type 2 diabetes mellitus with other skin ulcer 06/19/2022 No Yes Inactive Problems Resolved Problems Electronic Signature(s) Signed: 09/25/2022 1:28:06 PM By: Allen Derry PA-C Entered By: Allen Derry on 09/25/2022 13:28:06 -------------------------------------------------------------------------------- Progress Note Details Patient Name: Date of Service: [name], [name] 09/25/2022 1:15 PM Schweiger, Adreyan ([id number])  [id number] Page 5 of 7 Medical Record Number: [id number] Patient Account Number: [id number] Date of Birth/Sex: Treating RN: Feb 23, 1961 (62 y.o. [name] Primary Care Provider:  Yves Dill Other Clinician: Referring Provider: Treating Provider/Extender: Luan Moore, Neelam Weeks in Treatment: 14 Subjective Chief Complaint Information obtained from Patient Bilateral LE Ulcers History of Present Illness (HPI) 06-19-2022 upon evaluation today patient appears to be doing somewhat poorly in regard to a wound that began around Christmas time on the patient's ankle and lower extremities bilaterally. He tells me that he has been having quite significant pain at this point unfortunately. He did go to the emergency department on March 2. However this has been going on since around Christmas. On March 2 he had x-rays that were negative and I did review that today as well. The patient also has what appears to be chronic venous stasis which is an ongoing issue for him here as well and I think that is part of the issue. Right now it is just hard for him to wear anything compression wise although long-term I think he is going to need something. He does smoke currently that something that he needs to stop. Patient does have a history of diabetes mellitus type 2 which is stated to be diet controlled. He also again is a current smoker which I think is something that he needs to get rid of completely. 06-26-2022 upon evaluation today patient appears to be doing well currently in regard to his wounds all things considered he tells me the pain is a little bit better he has been on the correct antibiotic for only 2 days so he is seeing some improvement already this is already a good sign. Fortunately there does not appear to be any signs of infection locally nor systemically which is great news. No fevers, chills, nausea, vomiting, or diarrhea. 07-03-2022 upon evaluation today  patient appears to be doing better in regard to his leg the infection is clearing very well and I am actually much more pleased today with where things stand compared to where they were. Fortunately I do not see any signs of active infection locally or systemically at this time which is great news. No fevers, chills, nausea, vomiting, or diarrhea. 07-10-2022 upon evaluation today patient appears to be doing better I do not see any signs of infection I think he is much better in that regard. Fortunately I do not see any evidence of active infection locally or systemically which is great news. 07-17-2022 upon evaluation today patient actually appears to be doing better in regard to his wounds. He is showing some signs of improvement we actually now able to wrap him and I do think he is now allow me to do some debridement today as well which will also be good news. Fortunately I do not see any evidence of active infection locally nor systemically which is great news. 07-24-2022 upon evaluation patient actually appears to be making good progress here and I am very pleased with where we stand. I do think that he is showing signs of improvement I think that the medication is doing a good job as far as the new epithelial growth is concerned. He is in agreement with continuing with the current regimen the compression wrap has been beneficial the alginate did well but I do think we probably need to change this more than once a week. 07-31-2022 upon evaluation today patient appears to be doing well currently in regard to his wound. He has been tolerating the dressing changes on both legs without complication and overall I am extremely pleased actually with where we stand today. I do not see any signs  of active infection 08-07-2022 upon evaluation today patient appears to be doing well currently in regard to his wounds. He is actually making excellent progress and in general I do feel like that he is really doing quite  well. There does not appear to be any signs of active infection locally nor systemically which is great news. No fevers, chills, nausea, vomiting, or diarrhea. 08-14-2022 upon evaluation today patient appears to be doing well currently in regard to his wounds. He is actually been tolerating the dressing changes without complication. Fortunately there does not appear to be any signs of active infection locally nor systemically which is great news. No fevers, chills, nausea, vomiting, or diarrhea. 09-11-2022 upon evaluation today patient appears to be doing well currently in regard to his wounds he is actually been doing this himself as far as the dressing changes were concerned and seems to have done very well. Fortunately I do not see any signs of active infection locally nor systemically which is great news. 09-18-2022 upon evaluation today patient appears to be doing well currently in regard to his leg ulcers. In fact he just has a very small area on the back of his left leg at this point. He is showing signs of excellent improvement I am actually very pleased with where we stand today. I do not see any signs of active infection locally nor systemically which is great news. 09-25-2022 upon evaluation today patient is doing well his wound is almost completely healed this is significantly smaller and looks to be doing great. Fortunately I do not see any evidence of infection at this time. Objective Constitutional Well-nourished and well-hydrated in no acute distress. Vitals Time Taken: 1:20 PM, Height: 70 in, Weight: 180 lbs, BMI: 25.8, Temperature: 97.9 F, Pulse: 108 bpm, Respiratory Rate: 16 breaths/min, Blood Pressure: 127/67 mmHg. Respiratory normal breathing without difficulty. Psychiatric this patient is able to make decisions and demonstrates good insight into disease process. Alert and Oriented x 3. pleasant and cooperative. Siebel, [name] ([id number]) [id number]  Page 6 of 7 General Notes: Upon inspection patient's wound bed actually showed signs of good granulation epithelization at this point. Fortunately not see any evidence of worsening overall and good news is I do believe that we are making good headway towards complete closure. Integumentary (Hair, Skin) Wound #1 status is Open. Original cause of wound was Gradually Appeared. The date acquired was: 03/24/2022. The wound has been in treatment 14 weeks. The wound is located on the Left,Circumferential Lower Leg. The wound measures 2.1cm length x 1cm width x 0.1cm depth; 1.649cm^2 area and 0.165cm^3 volume. There is Fat Layer (Subcutaneous Tissue) exposed. There is no tunneling or undermining noted. There is a medium amount of serosanguineous drainage noted. There is medium (34-66%) red, pink granulation within the wound bed. There is a medium (34-66%) amount of necrotic tissue within the wound bed including Adherent Slough. 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 Bathing/ Shower/ Hygiene: May shower; gently cleanse wound with antibacterial soap, rinse and pat dry prior to dressing wounds No tub bath. Edema Control - Lymphedema / Segmental Compressive Device / Other: Tubigrip double layer applied - DD left leg Elevate, Exercise Daily  and Avoid Standing for Long Periods of Time. Elevate legs to the level of the heart and pump ankles as often as possible Elevate leg(s) parallel to the floor when sitting. DO YOUR BEST to sleep in the bed at night. DO NOT sleep in your recliner. Long hours  of sitting in a recliner leads to swelling of the legs and/or potential wounds on your backside. WOUND #1: - Lower Leg Wound Laterality: Left, Circumferential Cleanser: Soap and Water 1 x Per Week/30 Days Discharge Instructions: Gently cleanse wound with antibacterial soap, rinse and pat dry prior to dressing wounds Cleanser: Vashe 5.8 (oz) 1 x Per Week/30 Days Discharge Instructions: Use vashe 5.8 (oz) as directed Prim Dressing: Promogran Matrix 4.34 (in) 1 x Per Week/30 Days ary Discharge Instructions: Moisten w/normal saline or sterile water; Cover wound as directed. Do not remove from wound bed. Secondary Dressing: ABD Pad 5x9 (in/in) 1 x Per Week/30 Days Discharge Instructions: Cover with ABD pad Secondary Dressing: Kerlix 4.5 x 4.1 (in/yd) 1 x Per Week/30 Days Discharge Instructions: Apply Kerlix 4.5 x 4.1 (in/yd) as instructed Secured With: Tubigrip Size D, 3x10 (in/yd) 1 x Per Week/30 Days Discharge Instructions: Double 1. I would recommend that we go ahead and switch from the silver alginate to collagen to see if we can secure this much faster as far as complete closure is concerned patient is in agreement with plan. 2. I am good recommend as well that we continue with the Tubigrip were also using the ABD pad and roll gauze to secure in place. We will see patient back for reevaluation in 1 week here in the clinic. If anything worsens or changes patient will contact our office for additional recommendations. Electronic Signature(s) Signed: 09/25/2022 6:15:13 PM By: Allen Derry PA-C Entered By: Allen Derry on 09/25/2022 18:15:12 Flesch, [name] ([id number]) [id number] Page 7 of 7 -------------------------------------------------------------------------------- SuperBill Details Patient Name: Date of Service: [name], [name] 09/25/2022 Medical Record Number: [id number] Patient Account Number: [id number] Date of Birth/Sex: Treating RN: 05/04/1960 (62 y.o. [name],  [name] Primary Care Provider: Yves Dill Other Clinician: Referring Provider: Treating Provider/Extender: Luan Moore, Neelam Weeks in Treatment: 14 Diagnosis Coding ICD-10 Codes Code Description [phone] Chronic venous hypertension (idiopathic) with ulcer and inflammation of bilateral lower extremity L97.312 Non-pressure chronic ulcer of right ankle with fat layer exposed L97.822 Non-pressure chronic ulcer of other part of left lower leg with fat layer exposed F17.218 Nicotine dependence, cigarettes, with other nicotine-induced disorders E11.622 Type 2 diabetes mellitus with other skin ulcer Facility Procedures : CPT4 Code: [id number] Description: 99213 - WOUND CARE VISIT-LEV 3 EST PT Modifier: Quantity: 1 Physician Procedures : CPT4 Code Description Modifier [id number] - WC PHYS LEVEL 3 - EST PT ICD-10 Diagnosis Description I87.333 Chronic venous hypertension (idiopathic) with ulcer and inflammation of bilateral lower extremity L97.312 Non-pressure chronic ulcer of right  ankle with fat layer exposed L97.822 Non-pressure chronic ulcer of other part of left lower leg with fat layer exposed F17.218 Nicotine dependence, cigarettes, with other nicotine-induced disorders Quantity: 1 Electronic Signature(s) Signed: 09/25/2022 6:15:40 PM By: Allen Derry PA-C Entered By: Allen Derry on 09/25/2022 18:15:40

## 2022-10-01 ENCOUNTER — Encounter: Payer: 59 | Attending: Internal Medicine | Admitting: Internal Medicine

## 2022-10-01 DIAGNOSIS — L97822 Non-pressure chronic ulcer of other part of left lower leg with fat layer exposed: Secondary | ICD-10-CM | POA: Diagnosis not present

## 2022-10-01 DIAGNOSIS — L97312 Non-pressure chronic ulcer of right ankle with fat layer exposed: Secondary | ICD-10-CM | POA: Insufficient documentation

## 2022-10-01 DIAGNOSIS — M069 Rheumatoid arthritis, unspecified: Secondary | ICD-10-CM | POA: Diagnosis not present

## 2022-10-01 DIAGNOSIS — I87333 Chronic venous hypertension (idiopathic) with ulcer and inflammation of bilateral lower extremity: Secondary | ICD-10-CM | POA: Insufficient documentation

## 2022-10-01 DIAGNOSIS — E11622 Type 2 diabetes mellitus with other skin ulcer: Secondary | ICD-10-CM | POA: Insufficient documentation

## 2022-10-01 NOTE — Progress Notes (Addendum)
Walling, [name] ([id number]) [id number] Page 1 of 9 Visit Report for 10/01/2022 Arrival Information Details Patient Name: Date of Service: [name], [name] 10/01/2022 2:30 PM Medical Record Number: [id number] Patient Account Number: [id number] Date of Birth/Sex: Treating RN: [birth year] (62 y.o. [name] Primary Care [name]: [name] Other Clinician: Referring [name]: Treating [name]/Extender: [name], [name] in Treatment: 14 Visit Information History Since Last Visit Added or deleted any medications: No Patient Arrived: Ambulatory Any new allergies or adverse reactions: No Arrival Time: 14:46 Has Dressing in Place as Prescribed: Yes Accompanied By: self Pain Present Now: No Transfer Assistance: None Patient Identification Verified: Yes Secondary Verification Process Completed: Yes Patient Requires Transmission-Based Precautions: No Patient Has Alerts: No Electronic Signature(s) Signed: 10/01/2022 3:15:13 PM By: [name] Aver MSN RN CNS WTA Previous Signature: 10/01/2022 2:47:18 PM Version By: [name] Aver MSN RN CNS WTA Entered By: [name] on 10/01/2022 15:15:13 -------------------------------------------------------------------------------- Clinic Level of Care Assessment Details Patient Name: Date of Service: [name], [name] 10/01/2022 2:30 PM Medical Record Number: [id number] Patient Account Number: [id number] Date of Birth/Sex: Treating RN: [name] (62 y.o. [name] Primary Care Nadia Torr: [name] Other Clinician: Referring Klinton Candelas: Treating Melford Tullier/Extender: [name], [name] in Treatment: 14 Clinic Level of Care Assessment Items TOOL 1 Quantity Score []  - 0 Use when EandM and Procedure is performed on INITIAL visit ASSESSMENTS - Nursing Assessment / Reassessment []  - 0 General Physical Exam (combine w/ comprehensive assessment (listed just below) when performed on new pt. evals) []  -  0 Comprehensive Assessment (HX, ROS, Risk Assessments, Wounds Hx, etc.) ASSESSMENTS - Wound and Skin Assessment / Reassessment []  - 0 Dermatologic / Skin Assessment (not related to wound area) ASSESSMENTS - Ostomy and/or Continence Assessment and Care [name], [name] ([id number]) [id number] Page 2 of 9 []  - 0 Incontinence Assessment and Management []  - 0 Ostomy Care Assessment and Management (repouching, etc.) PROCESS - Coordination of Care []  - 0 Simple Patient / Family Education for ongoing care []  - 0 Complex (extensive) Patient / Family Education for ongoing care []  - 0 Staff obtains Chiropractor, Records, T Results / Process Orders est []  - 0 Staff telephones HHA, Nursing Homes / Clarify orders / etc []  - 0 Routine Transfer to another Facility (non-emergent condition) []  - 0 Routine Hospital Admission (non-emergent condition) []  - 0 New Admissions / Manufacturing engineer / Ordering NPWT Apligraf, etc. , []  - 0 Emergency Hospital Admission (emergent condition) PROCESS - Special Needs []  - 0 Pediatric / Minor Patient Management []  - 0 Isolation Patient Management []  - 0 Hearing / Language / Visual special needs []  - 0 Assessment of Community assistance (transportation, D/C planning, etc.) []  - 0 Additional assistance / Altered mentation []  - 0 Support Surface(s) Assessment (bed, cushion, seat, etc.) INTERVENTIONS - Miscellaneous []  - 0 External ear exam []  - 0 Patient Transfer (multiple staff / Nurse, adult / Similar devices) []  - 0 Simple Staple / Suture removal (25 or less) []  - 0 Complex Staple / Suture removal (26 or more) []  - 0 Hypo/Hyperglycemic Management (do not check if billed separately) []  - 0 Ankle / Brachial Index (ABI) - do not check if billed separately Has the patient been seen at the hospital within the last three years: Yes Total Score: 0 Level Of Care: ____ Electronic Signature(s) Signed: 10/06/2022 5:27:27 PM By:  [name] Aver MSN RN CNS WTA Entered By: [name] on 10/01/2022 15:22:49 -------------------------------------------------------------------------------- Encounter Discharge Information Details Patient Name: Date  of Service: [name], [name] 10/01/2022 2:30 PM Medical Record Number: [id number] Patient Account Number: [id number] Date of Birth/Sex: Treating RN: [birth year][name] (62 y.o. [name] Primary Care Tasneem Cormier: [name] Other Clinician: Referring Tonishia Steffy: Treating Mileidy Atkin/Extender: Derry Lory [name] in Treatment: 14 Encounter Discharge Information Items Post Procedure Vitals Discharge Condition: Stable Temperature (F): 98.0 [name], [name] ([id number]) [id number] Page 3 of 9 Ambulatory Status: Ambulatory Pulse (bpm): 83 Discharge Destination: Home Respiratory Rate (breaths/min): 16 Transportation: Private Auto Blood Pressure (mmHg): 117/75 Accompanied By: self Schedule Follow-up Appointment: Yes Clinical Summary of Care: Electronic Signature(s) Signed: 10/01/2022 4:08:47 PM By: [name] Aver MSN RN CNS WTA Previous Signature: 10/01/2022 4:01:50 PM Version By: [name] Aver MSN RN CNS WTA Entered By: [name] on 10/01/2022 16:08:47 -------------------------------------------------------------------------------- Lower Extremity Assessment Details Patient Name: Date of Service: [name], [name] 10/01/2022 2:30 PM Medical Record Number: [id number] Patient Account Number: [id number] Date of Birth/Sex: Treating RN: [birth year] [name] (62 y.o. [name] Primary Care Helio Lack: [name] Other Clinician: Referring Tarek Cravens: Treating Bashar Milam/Extender: [name], [name] in Treatment: 14 Edema Assessment Assessed: [Left: No] [Right: No] Edema: [Left: N] [Right: o] Calf Left: Right: Point of Measurement: 33 cm From Medial Instep 31 cm Ankle Left: Right: Point of Measurement: 12 cm From Medial Instep 23 cm Knee To  Floor Left: Right: From Medial Instep 38.5 cm Vascular Assessment Pulses: Dorsalis Pedis Palpable: [Left:Yes] Electronic Signature(s) Signed: 10/01/2022 3:15:29 PM By: [name] Aver MSN RN CNS WTA Entered By: [name] on 10/01/2022 15:15:29 [name], [name] ([id number]) [id number] Page 4 of 9 -------------------------------------------------------------------------------- Multi Wound Chart Details Patient Name: Date of Service: [name], [name] 10/01/2022 2:30 PM Medical Record Number: [id number] Patient Account Number: [id number] Date of Birth/Sex: Treating RN: [birth year][name] (62 y.o. [name] Primary Care Demerius Podolak: [name] Other Clinician: Referring Lyvonne Cassell: Treating Jamisen Duerson/Extender: [name], [name] in Treatment: 14 Vital Signs Height(in): 70 Pulse(bpm): 83 Weight(lbs): 180 Blood Pressure(mmHg): 117/75 Body Mass Index(BMI): 25.8 Temperature(F): 98.0 Respiratory Rate(breaths/min): 16 [1:Photos:] [N/A:N/A] Left, Circumferential Lower Leg N/A N/A Wound Location: Gradually Appeared N/A N/A Wounding Event: Venous Leg Ulcer N/A N/A Primary Etiology: Rheumatoid Arthritis N/A N/A Comorbid History: 03/24/2022 N/A N/A Date Acquired: 14 N/A N/A [name] of Treatment: Open N/A N/A Wound Status: No N/A N/A Wound Recurrence: 1.5x0.7x0.1 N/A N/A Measurements L x W x D (cm) 0.825 N/A N/A A (cm) : rea 0.082 N/A N/A Volume (cm) : 99.10% N/A N/A % Reduction in A rea: 99.10% N/A N/A % Reduction in Volume: Full Thickness Without Exposed N/A N/A Classification: Support Structures Medium N/A N/A Exudate A mount: Serosanguineous N/A N/A Exudate Type: red, brown N/A N/A Exudate Color: Medium (34-66%) N/A N/A Granulation A mount: Red, Pink N/A N/A Granulation Quality: Medium (34-66%) N/A N/A Necrotic A mount: Fat Layer (Subcutaneous Tissue): Yes N/A N/A Exposed Structures: None N/A N/A Epithelialization: Debridement -  Selective/Open Wound N/A N/A Debridement: Pre-procedure Verification/Time Out 03:04 N/A N/A Taken: Slough N/A N/A Tissue Debrided: Non-Viable Tissue N/A N/A Level: 0.82 N/A N/A Debridement A (sq cm): rea Curette N/A N/A Instrument: Minimum N/A N/A Bleeding: Pressure N/A N/A Hemostasis A chieved: 0 N/A N/A Procedural Pain: 0 N/A N/A Post Procedural Pain: Procedure was tolerated well N/A N/A Debridement Treatment Response: 1.5x0.7x0.1 N/A N/A Post Debridement Measurements L x W x D (cm) 0.082 N/A N/A Post Debridement Volume: (cm) Debridement N/A N/A Procedures Performed: [name], [name] ([id number]) [id number] Page 5 of 9 Treatment Notes Electronic Signature(s) Signed: 10/01/2022 3:21:15 PM  By: [name] Aver MSN RN CNS WTA Entered By: [name] on 10/01/2022 15:21:15 -------------------------------------------------------------------------------- Multi-Disciplinary Care Plan Details Patient Name: Date of Service: [name], [name] 10/01/2022 2:30 PM Medical Record Number: [id number] Patient Account Number: [id number] Date of Birth/Sex: Treating RN: [birth year][name] (62 y.o. [name] Primary Care Maykayla Highley: [name] Other Clinician: Referring Story Conti: Treating Pearly Bartosik/Extender: [name], [name] in Treatment: 14 Active Inactive Wound/Skin Impairment Nursing Diagnoses: Impaired tissue integrity Knowledge deficit related to ulceration/compromised skin integrity Goals: Ulcer/skin breakdown will have a volume reduction of 30% by week 4 Date Initiated: 06/19/2022 Date Inactivated: 07/17/2022 Target Resolution Date: 07/17/2022 Goal Status: Met Ulcer/skin breakdown will have a volume reduction of 50% by week 8 Date Initiated: 06/19/2022 Date Inactivated: 08/14/2022 Target Resolution Date: 08/14/2022 Goal Status: Met Ulcer/skin breakdown will have a volume reduction of 80% by week 12 Date Initiated: 06/19/2022 Date Inactivated:  09/18/2022 Target Resolution Date: 09/11/2022 Goal Status: Met Ulcer/skin breakdown will heal within 14 [name] Date Initiated: 06/19/2022 Target Resolution Date: 10/29/2022 Goal Status: Active Interventions: Assess patient/caregiver ability to obtain necessary supplies Assess patient/caregiver ability to perform ulcer/skin care regimen upon admission and as needed Assess ulceration(s) every visit Provide education on ulcer and skin care Treatment Activities: Referred to DME Anterio Scheel for dressing supplies : 06/19/2022 Skin care regimen initiated : 06/19/2022 Notes: Electronic Signature(s) Signed: 10/01/2022 3:23:11 PM By: [name] Aver MSN RN CNS WTA Entered By: [name] on 10/01/2022 15:23:11 [name], [name] ([id number]) [id number] Page 6 of 9 -------------------------------------------------------------------------------- Pain Assessment Details Patient Name: Date of Service: [name], [name] 10/01/2022 2:30 PM Medical Record Number: [id number] Patient Account Number: [id number] Date of Birth/Sex: Treating RN: [birth year][name] (62 y.o. [name] Primary Care Kymberli Wiegand: [name] Other Clinician: Referring Zoe Goonan: Treating Petrina Melby/Extender: [name], [name] in Treatment: 14 Active Problems Location of Pain Severity and Description of Pain Patient Has Paino No Site Locations Pain Management and Medication Current Pain Management: Electronic Signature(s) Signed: 10/01/2022 3:15:21 PM By: [name] Aver MSN RN CNS WTA Entered By: [name] on 10/01/2022 15:15:20 -------------------------------------------------------------------------------- Patient/Caregiver Education Details Patient Name: Date of Service: [name], [name] 7/3/2024andnbsp2:30 PM Medical Record Number: [id number] Patient Account Number: [id number] Date of Birth/Gender: Treating RN: [birth year] [name] (62 y.o. [name] Primary Care Physician: [name] Other Clinician: Referring  Physician: Treating Physician/Extender: [name], Ezekiel Slocumb [name] in Treatment: [address] [place] ([id number]) [id number] Page 7 of 9 Education Assessment Education Provided To: Patient Education Topics Provided Wound/Skin Impairment: Handouts: Caring for Your Ulcer Methods: Explain/Verbal Responses: State content correctly Electronic Signature(s) Signed: 10/06/2022 5:27:27 PM By: [name] Aver MSN RN CNS WTA Entered By: [name] on 10/01/2022 15:23:36 -------------------------------------------------------------------------------- Wound Assessment Details Patient Name: Date of Service: [name], [name] 10/01/2022 2:30 PM Medical Record Number: [id number] Patient Account Number: [id number] Date of Birth/Sex: Treating RN: [birth year] [name] (62 y.o. [name] Primary Care Chrystina Naff: [name] Other Clinician: Referring Coleton Woon: Treating Tynasia Mccaul/Extender: [name], [name] in Treatment: 14 Wound Status Wound Number: 1 Primary Etiology: Venous Leg Ulcer Wound Location: Left, Circumferential Lower Leg Wound Status: Open Wounding Event: Gradually Appeared Comorbid History: Rheumatoid Arthritis Date Acquired: 03/24/2022 [name] Of Treatment: 14 Clustered Wound: No Photos Wound Measurements Length: (cm) 1.5 Width: (cm) 0.7 Depth: (cm) 0.1 Area: (cm) 0.825 Volume: (cm) 0.082 % Reduction in Area: 99.1% % Reduction in Volume: 99.1% Epithelialization: None Wound Description Classification: Full Thickness Without Exposed Support Exudate Amount: Medium Exudate Type: Serosanguineous [name], [name] ([id number]) Exudate Color: red, brown  Structures Foul Odor After Cleansing: No Slough/Fibrino Yes [id number] Page 8 of 9 Wound Bed Granulation Amount: Medium (34-66%) Exposed Structure Granulation Quality: Red, Pink Fat Layer (Subcutaneous Tissue) Exposed: Yes Necrotic Amount: Medium (34-66%) Necrotic Quality:  Adherent Slough Treatment Notes Wound #1 (Lower Leg) Wound Laterality: Left, Circumferential Cleanser Soap and Water Discharge Instruction: Gently cleanse wound with antibacterial soap, rinse and pat dry prior to dressing wounds Peri-Wound Care Topical Primary Dressing Promogran Matrix 4.34 (in) Discharge Instruction: Moisten w/normal saline or sterile water; Cover wound as directed. Do not remove from wound bed. Secondary Dressing ABD Pad 5x9 (in/in) Discharge Instruction: Cover with ABD pad Kerlix 4.5 x 4.1 (in/yd) Discharge Instruction: Apply Kerlix 4.5 x 4.1 (in/yd) as instructed Secured With Tubigrip Size D, 3x10 (in/yd) Discharge Instruction: Double Compression Wrap Compression Stockings Add-Ons Electronic Signature(s) Signed: 10/06/2022 5:27:27 PM By: [name] Aver MSN RN CNS WTA Entered By: [name] on 10/01/2022 14:59:46 -------------------------------------------------------------------------------- Vitals Details Patient Name: Date of Service: [name], [name] 10/01/2022 2:30 PM Medical Record Number: [id number] Patient Account Number: [id number] Date of Birth/Sex: Treating RN: [name][birth year] (62 y.o. [name] Primary Care Benito Lemmerman: [name] Other Clinician: Referring Tristian Bouska: Treating Gatha Mcnulty/Extender: [name], [name] in Treatment: 14 Vital Signs Time Taken: 14:53 Temperature (F): 98.0 Height (in): 70 Pulse (bpm): 83 Weight (lbs): 180 Respiratory Rate (breaths/min): 16 Body Mass Index (BMI): 25.8 Blood Pressure (mmHg): 117/75 Reference Range: 80 - 120 mg / dl [name], [name] ([id number]) [id number] Page 9 of 9 Electronic Signature(s) Signed: 10/01/2022 3:15:16 PM By: [name] Aver MSN RN CNS WTA Entered By: [name] on 10/01/2022 15:15:16

## 2022-10-06 NOTE — Progress Notes (Signed)
[name], [name] ([id number]) [id number] Page 1 of 8 Visit Report for 10/01/2022 Chief Complaint Document Details Patient Name: Date of Service: [name], [name] 10/01/2022 2:30 PM Medical Record Number: [id number] Patient Account Number: [id number] Date of Birth/Sex: Treating RN: [birth year] (62 y.o. [name] Primary Care Provider: Yves [name] Other Clinician: Referring Provider: Treating Provider/Extender: [name], [name] Weeks in Treatment: 14 Information Obtained from: Patient Chief Complaint Bilateral LE Ulcers Electronic Signature(s) Signed: 10/01/2022 4:13:50 PM By: [name] Corwin DO Entered By: [name] on 10/01/2022 15:05:32 -------------------------------------------------------------------------------- Debridement Details Patient Name: Date of Service: [name], [name] 10/01/2022 2:30 PM Medical Record Number: [id number] Patient Account Number: [id number] Date of Birth/Sex: Treating RN: [birth year] (62 y.o. [name] Primary Care Provider: Yves [name] Other Clinician: Referring Provider: Treating Provider/Extender: [name] Weeks in Treatment: 14 Debridement Performed for Assessment: Wound #1 Left,Circumferential Lower Leg Performed By: Physician [name] Corwin, MD Debridement Type: Debridement Severity of Tissue Pre Debridement: Fat layer exposed Level of Consciousness (Pre-procedure): Awake and Alert Pre-procedure Verification/Time Out Yes - 03:04 Taken: Start Time: 03:04 Percent of Wound Bed Debrided: 100% T Area Debrided (cm): otal 0.82 Tissue and other material debrided: Viable, Non-Viable, Slough, Slough Level: Non-Viable Tissue Debridement Description: Selective/Open Wound Instrument: Curette Bleeding: Minimum Hemostasis Achieved: Pressure Procedural Pain: 0 Post Procedural Pain: 0 Response to Treatment: Procedure was tolerated well [name], [name] ([id number])  [id number] Page 2 of 8 Level of Consciousness (Post- Awake and Alert procedure): Post Debridement Measurements of Total Wound Length: (cm) 1.5 Width: (cm) 0.7 Depth: (cm) 0.1 Volume: (cm) 0.082 Character of Wound/Ulcer Post Debridement: Stable Severity of Tissue Post Debridement: Fat layer exposed Post Procedure Diagnosis Same as Pre-procedure Electronic Signature(s) Signed: 10/01/2022 4:13:50 PM By: [name] Corwin DO Signed: 10/06/2022 5:27:27 PM By: [name] Aver MSN RN CNS WTA Entered By: [name] on 10/01/2022 15:05:39 -------------------------------------------------------------------------------- HPI Details Patient Name: Date of Service: [name], [name] 10/01/2022 2:30 PM Medical Record Number: [id number] Patient Account Number: [id number] Date of Birth/Sex: Treating RN: 08-13-1960 (62 y.o. [name] Primary Care Provider: Yves [name] Other Clinician: Referring Provider: Treating Provider/Extender: [name], [name] Weeks in Treatment: 14 History of Present Illness HPI Description: 06-19-2022 upon evaluation today patient appears to be doing somewhat poorly in regard to a wound that began around Christmas time on the patient's ankle and lower extremities bilaterally. He tells me that he has been having quite significant pain at this point unfortunately. He did go to the emergency department on March 2. However this has been going on since around Christmas. On March 2 he had x-rays that were negative and I did review that today as well. The patient also has what appears to be chronic venous stasis which is an ongoing issue for him here as well and I think that is part of the issue. Right now it is just hard for him to wear anything compression wise although long-term I think he is going to need something. He does smoke currently that something that he needs to stop. Patient does have a history of diabetes mellitus type 2 which is  stated to be diet controlled. He also again is a current smoker which I think is something that he needs to get rid of completely. 06-26-2022 upon evaluation today patient appears to be doing well currently in regard to his wounds all things considered he tells me the pain is a little bit better he has been on the correct antibiotic for only 2  days so he is seeing some improvement already this is already a good sign. Fortunately there does not appear to be any signs of infection locally nor systemically which is great news. No fevers, chills, nausea, vomiting, or diarrhea. 07-03-2022 upon evaluation today patient appears to be doing better in regard to his leg the infection is clearing very well and I am actually much more pleased today with where things stand compared to where they were. Fortunately I do not see any signs of active infection locally or systemically at this time which is great news. No fevers, chills, nausea, vomiting, or diarrhea. 07-10-2022 upon evaluation today patient appears to be doing better I do not see any signs of infection I think he is much better in that regard. Fortunately I do not see any evidence of active infection locally or systemically which is great news. 07-17-2022 upon evaluation today patient actually appears to be doing better in regard to his wounds. He is showing some signs of improvement we actually now able to wrap him and I do think he is now allow me to do some debridement today as well which will also be good news. Fortunately I do not see any evidence of active infection locally nor systemically which is great news. 07-24-2022 upon evaluation patient actually appears to be making good progress here and I am very pleased with where we stand. I do think that he is showing signs of improvement I think that the medication is doing a good job as far as the new epithelial growth is concerned. He is in agreement with continuing with the current regimen the  compression wrap has been beneficial the alginate did well but I do think we probably need to change this more than once a week. 07-31-2022 upon evaluation today patient appears to be doing well currently in regard to his wound. He has been tolerating the dressing changes on both legs without complication and overall I am extremely pleased actually with where we stand today. I do not see any signs of active infection 08-07-2022 upon evaluation today patient appears to be doing well currently in regard to his wounds. He is actually making excellent progress and in general I do feel like that he is really doing quite well. There does not appear to be any signs of active infection locally nor systemically which is great news. No fevers, chills, nausea, vomiting, or diarrhea. Alwine, [name] ([id number]) [id number] Page 3 of 8 08-14-2022 upon evaluation today patient appears to be doing well currently in regard to his wounds. He is actually been tolerating the dressing changes without complication. Fortunately there does not appear to be any signs of active infection locally nor systemically which is great news. No fevers, chills, nausea, vomiting, or diarrhea. 09-11-2022 upon evaluation today patient appears to be doing well currently in regard to his wounds he is actually been doing this himself as far as the dressing changes were concerned and seems to have done very well. Fortunately I do not see any signs of active infection locally nor systemically which is great news. 09-18-2022 upon evaluation today patient appears to be doing well currently in regard to his leg ulcers. In fact he just has a very small area on the back of his left leg at this point. He is showing signs of excellent improvement I am actually very pleased with where we stand today. I do not see any signs of active infection locally nor systemically which is great news. 09-25-2022 upon evaluation  today patient is  doing well his wound is almost completely healed this is significantly smaller and looks to be doing great. Fortunately I do not see any evidence of infection at this time. 7/3; patient presents for follow-up. He has been using collagen to the wound bed under Tubigrip. He changes this 3 times weekly. He has no issues or complaints today. He denies signs of infection. Electronic Signature(s) Signed: 10/01/2022 4:13:50 PM By: [name] Corwin DO Entered By: [name] on 10/01/2022 15:05:58 -------------------------------------------------------------------------------- Physical Exam Details Patient Name: Date of Service: [name], [name] 10/01/2022 2:30 PM Medical Record Number: [id number] Patient Account Number: [id number] Date of Birth/Sex: Treating RN: 1960/07/23 (62 y.o. [name] Primary Care Provider: Yves [name] Other Clinician: Referring Provider: Treating Provider/Extender: [name], Neelam Weeks in Treatment: 14 Constitutional . Cardiovascular . Psychiatric . Notes Left lower extremity: T the posterior aspect there is an open wound with granulation tissue and slough accumulation. No signs of active infection. Decent o edema control. Electronic Signature(s) Signed: 10/01/2022 4:13:50 PM By: [name] Corwin DO Entered By: [name] on 10/01/2022 15:06:23 Physician Orders Details -------------------------------------------------------------------------------- Alecia Lemming ([id number]) [id number] Page 4 of 8 Patient Name: Date of Service: [name], [name] 10/01/2022 2:30 PM Medical Record Number: [id number] Patient Account Number: [id number] Date of Birth/Sex: Treating RN: 1960/05/25 (62 y.o. [name] Primary Care Provider: Yves [name] Other Clinician: Referring Provider: Treating Provider/Extender: [name], [name] Weeks in Treatment: [phone] Verbal / Phone Orders: No Diagnosis Coding ICD-10 Coding Code  Description I87.333 Chronic venous hypertension (idiopathic) with ulcer and inflammation of bilateral lower extremity L97.312 Non-pressure chronic ulcer of right ankle with fat layer exposed L97.822 Non-pressure chronic ulcer of other part of left lower leg with fat layer exposed F17.218 Nicotine dependence, cigarettes, with other nicotine-induced disorders E11.622 Type 2 diabetes mellitus with other skin ulcer Follow-up Appointments Return Appointment in 1 week. Bathing/ Shower/ Hygiene May shower; gently cleanse wound with antibacterial soap, rinse and pat dry prior to dressing wounds Wound Treatment Wound #1 - Lower Leg Wound Laterality: Left, Circumferential Cleanser: Soap and Water 3 x Per Week/30 Days Discharge Instructions: Gently cleanse wound with antibacterial soap, rinse and pat dry prior to dressing wounds Prim Dressing: Promogran Matrix 4.34 (in) 3 x Per Week/30 Days ary Discharge Instructions: Moisten w/normal saline or sterile water; Cover wound as directed. Do not remove from wound bed. Secondary Dressing: ABD Pad 5x9 (in/in) 3 x Per Week/30 Days Discharge Instructions: Cover with ABD pad Secondary Dressing: Kerlix 4.5 x 4.1 (in/yd) 3 x Per Week/30 Days Discharge Instructions: Apply Kerlix 4.5 x 4.1 (in/yd) as instructed Secured With: Tubigrip Size D, 3x10 (in/yd) 3 x Per Week/30 Days Discharge Instructions: Double Electronic Signature(s) Signed: 10/01/2022 3:22:43 PM By: [name] Aver MSN RN CNS WTA Signed: 10/01/2022 4:13:50 PM By: [name] Corwin DO Entered By: [name] on 10/01/2022 15:22:43 -------------------------------------------------------------------------------- Problem List Details Patient Name: Date of Service: [name], [name] 10/01/2022 2:30 PM Medical Record Number: [id number] Patient Account Number: [id number] Date of Birth/Sex: Treating RN: 27-Mar-1961 (62 y.o. [name] Primary Care Provider: Yves [name] Other Clinician: Referring  Provider: Treating Provider/Extender: [name], [name] Weeks in Treatment: [address] Problems Lindler, [name] ([id number]) [id number] Page 5 of 8 ICD-10 Encounter Code Description Active Date MDM Diagnosis I87.333 Chronic venous hypertension (idiopathic) with ulcer and inflammation of 06/19/2022 No Yes bilateral lower extremity L97.312 Non-pressure chronic ulcer of right ankle with fat layer exposed 06/19/2022 No Yes L97.822 Non-pressure chronic  ulcer of other part of left lower leg with fat layer exposed3/21/2024 No Yes F17.218 Nicotine dependence, cigarettes, with other nicotine-induced disorders 06/19/2022 No Yes E11.622 Type 2 diabetes mellitus with other skin ulcer 06/19/2022 No Yes Inactive Problems Resolved Problems Electronic Signature(s) Signed: 10/01/2022 4:00:28 PM By: [name] Aver MSN RN CNS WTA Signed: 10/01/2022 4:13:50 PM By: [name] Corwin DO Entered By: [name] on 10/01/2022 16:00:28 -------------------------------------------------------------------------------- Progress Note Details Patient Name: Date of Service: [name], [name] 10/01/2022 2:30 PM Medical Record Number: [id number] Patient Account Number: [id number] Date of Birth/Sex: Treating RN: 29-Jun-1960 (62 y.o. [name] Primary Care Provider: Yves [name] Other Clinician: Referring Provider: Treating Provider/Extender: [name], [name] Weeks in Treatment: 14 Subjective Chief Complaint Information obtained from Patient Bilateral LE Ulcers History of Present Illness (HPI) 06-19-2022 upon evaluation today patient appears to be doing somewhat poorly in regard to a wound that began around Christmas time on the patient's ankle and lower extremities bilaterally. He tells me that he has been having quite significant pain at this point unfortunately. He did go to the emergency department on March 2. However this has been going on since around Christmas. On March 2  he had x-rays that were negative and I did review that today as well. The patient also has what appears to be chronic venous stasis which is an ongoing issue for him here as well and I think that is part of the issue. Right now it is just hard for him to wear anything compression wise although long-term I think he is going to need something. He does smoke currently that something that he needs to stop. Patient does have a history of diabetes mellitus type 2 which is stated to be diet controlled. He also again is a current smoker which I think is something that he needs to get rid of completely. 06-26-2022 upon evaluation today patient appears to be doing well currently in regard to his wounds all things considered he tells me the pain is a little bit better he has been on the correct antibiotic for only 2 days so he is seeing some improvement already this is already a good sign. Fortunately there does not appear to be any signs of infection locally nor systemically which is great news. No fevers, chills, nausea, vomiting, or diarrhea. Deasis, [name] ([id number]) [id number] Page 6 of 8 07-03-2022 upon evaluation today patient appears to be doing better in regard to his leg the infection is clearing very well and I am actually much more pleased today with where things stand compared to where they were. Fortunately I do not see any signs of active infection locally or systemically at this time which is great news. No fevers, chills, nausea, vomiting, or diarrhea. 07-10-2022 upon evaluation today patient appears to be doing better I do not see any signs of infection I think he is much better in that regard. Fortunately I do not see any evidence of active infection locally or systemically which is great news. 07-17-2022 upon evaluation today patient actually appears to be doing better in regard to his wounds. He is showing some signs of improvement we actually now able to wrap him and I  do think he is now allow me to do some debridement today as well which will also be good news. Fortunately I do not see any evidence of active infection locally nor systemically which is great news. 07-24-2022 upon evaluation patient actually appears to be making good progress here and I am  very pleased with where we stand. I do think that he is showing signs of improvement I think that the medication is doing a good job as far as the new epithelial growth is concerned. He is in agreement with continuing with the current regimen the compression wrap has been beneficial the alginate did well but I do think we probably need to change this more than once a week. 07-31-2022 upon evaluation today patient appears to be doing well currently in regard to his wound. He has been tolerating the dressing changes on both legs without complication and overall I am extremely pleased actually with where we stand today. I do not see any signs of active infection 08-07-2022 upon evaluation today patient appears to be doing well currently in regard to his wounds. He is actually making excellent progress and in general I do feel like that he is really doing quite well. There does not appear to be any signs of active infection locally nor systemically which is great news. No fevers, chills, nausea, vomiting, or diarrhea. 08-14-2022 upon evaluation today patient appears to be doing well currently in regard to his wounds. He is actually been tolerating the dressing changes without complication. Fortunately there does not appear to be any signs of active infection locally nor systemically which is great news. No fevers, chills, nausea, vomiting, or diarrhea. 09-11-2022 upon evaluation today patient appears to be doing well currently in regard to his wounds he is actually been doing this himself as far as the dressing changes were concerned and seems to have done very well. Fortunately I do not see any signs of active infection  locally nor systemically which is great news. 09-18-2022 upon evaluation today patient appears to be doing well currently in regard to his leg ulcers. In fact he just has a very small area on the back of his left leg at this point. He is showing signs of excellent improvement I am actually very pleased with where we stand today. I do not see any signs of active infection locally nor systemically which is great news. 09-25-2022 upon evaluation today patient is doing well his wound is almost completely healed this is significantly smaller and looks to be doing great. Fortunately I do not see any evidence of infection at this time. 7/3; patient presents for follow-up. He has been using collagen to the wound bed under Tubigrip. He changes this 3 times weekly. He has no issues or complaints today. He denies signs of infection. Objective Constitutional Vitals Time Taken: 2:53 PM, Height: 70 in, Weight: 180 lbs, BMI: 25.8, Temperature: 98.0 F, Pulse: 83 bpm, Respiratory Rate: 16 breaths/min, Blood Pressure: 117/75 mmHg. General Notes: Left lower extremity: T the posterior aspect there is an open wound with granulation tissue and slough accumulation. No signs of active o infection. Decent edema control. Integumentary (Hair, Skin) Wound #1 status is Open. Original cause of wound was Gradually Appeared. The date acquired was: 03/24/2022. The wound has been in treatment 14 weeks. The wound is located on the Left,Circumferential Lower Leg. The wound measures 1.5cm length x 0.7cm width x 0.1cm depth; 0.825cm^2 area and 0.082cm^3 volume. There is Fat Layer (Subcutaneous Tissue) exposed. There is a medium amount of serosanguineous drainage noted. There is medium (34-66%) red, pink granulation within the wound bed. There is a medium (34-66%) amount of necrotic tissue within the wound bed including Adherent Slough. 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 I debrided nonviable tissue. I recommended continuing the course with collagen under Tubigrip to be changed 3 times weekly. Follow-up in 1 week. Procedures Gravely, [name] ([id number]) [id number] Page 7 of 8 Wound #1 Pre-procedure diagnosis of Wound #1 is a Venous Leg Ulcer located on the Left,Circumferential Lower Leg .Severity of Tissue Pre Debridement is: Fat layer exposed. There was a Selective/Open Wound Non-Viable Tissue Debridement with a total area of 0.82 sq cm performed by [name] Corwin, MD. With the following instrument(s): Curette to remove Viable and Non-Viable tissue/material. Material removed includes Slough. No specimens were taken. A time out was conducted at 03:04, prior to the start of the procedure. A Minimum amount of bleeding was controlled with Pressure. The procedure was tolerated well with a pain level of 0 throughout and a pain level of 0 following the procedure. Post Debridement Measurements: 1.5cm length x 0.7cm width x 0.1cm depth; 0.082cm^3 volume. Character of Wound/Ulcer Post Debridement is stable. Severity of Tissue Post Debridement is: Fat layer exposed. Post procedure Diagnosis Wound #1: Same as Pre-Procedure Plan 1. In office sharp debridement 2. Collagen 3. Tubigrip 4. Follow-up in 1 week Electronic Signature(s) Signed: 10/01/2022 4:13:50 PM By: [name] Corwin DO Entered By: [name] on 10/01/2022 15:06:55 -------------------------------------------------------------------------------- ROS/PFSH Details Patient Name: Date of  Service: [name], [name] 10/01/2022 2:30 PM Medical Record Number: [id number] Patient Account Number: [id number] Date of Birth/Sex: Treating RN: [birth year] (62 y.o. [name] Primary Care Provider: Yves [name] Other Clinician: Referring Provider: Treating Provider/Extender: [name] Weeks in Treatment: 14 Information Obtained From Patient Endocrine Medical History: Past Medical History Notes: pre diabetic per pt, takes no medications, no glucose checks Musculoskeletal Medical History: Positive for: Rheumatoid Arthritis Immunizations Pneumococcal Vaccine: Received Pneumococcal Vaccination: No Implantable Devices None Family and Social History Current every day smoker; Alcohol Use: Rarely; Drug Use: Prior History; Caffeine Use: Daily [name], [name] ([id number]) [id number] Page 8 of 8 Electronic Signature(s) Signed: 10/01/2022 4:13:50 PM By: [name] Corwin DO Signed: 10/06/2022 5:27:27 PM By: [name] Aver MSN RN CNS WTA Entered By: [name] on 10/01/2022 15:07:34 -------------------------------------------------------------------------------- SuperBill Details Patient Name: Date of Service: [name], [name] 10/01/2022 Medical Record Number: [id number] Patient Account Number: [id number] Date of Birth/Sex: Treating RN: [birth year] (62 y.o. [name] Primary Care Provider: Yves [name] Other Clinician: Referring Provider: Treating Provider/Extender: [name], Neelam Weeks in Treatment: 14 Diagnosis Coding ICD-10 Codes Code Description [phone] Chronic venous hypertension (idiopathic) with ulcer and inflammation of bilateral lower extremity L97.312 Non-pressure chronic ulcer of right ankle with fat layer exposed L97.822 Non-pressure chronic ulcer of other part of left lower leg with fat layer exposed F17.218 Nicotine dependence, cigarettes, with other nicotine-induced disorders E11.622 Type 2 diabetes mellitus with  other skin ulcer Facility Procedures : CPT4 Code: 41324401 Description: 97597 - DEBRIDE WOUND 1ST 20 SQ CM OR < ICD-10 Diagnosis Description L97.822 Non-pressure chronic ulcer of other part of left lower leg with fat layer exposed I87.333 Chronic venous hypertension (idiopathic) with ulcer and inflammation of  bilateral E11.622 Type 2 diabetes mellitus with other skin ulcer Modifier: lower extremity Quantity: 1 Physician Procedures : CPT4 Code Description Modifier [id number] 97597 - WC PHYS DEBR WO ANESTH 20 SQ  CM ICD-10 Diagnosis Description L97.822 Non-pressure chronic ulcer of other part of left lower leg with fat layer exposed I87.333 Chronic venous hypertension (idiopathic) with  ulcer and inflammation of bilateral lower extremity E11.622 Type 2 diabetes mellitus with other skin ulcer Quantity: 1 Electronic Signature(s) Signed: 10/01/2022 3:22:54 PM By: [name] Aver MSN RN CNS WTA Signed: 10/01/2022 4:13:50 PM By: [name] Corwin DO Entered By: [name] on 10/01/2022 15:22:54

## 2022-10-09 ENCOUNTER — Encounter: Payer: PRIVATE HEALTH INSURANCE | Admitting: Physician Assistant

## 2022-10-09 DIAGNOSIS — I87332 Chronic venous hypertension (idiopathic) with ulcer and inflammation of left lower extremity: Secondary | ICD-10-CM | POA: Diagnosis not present

## 2022-10-09 DIAGNOSIS — L97312 Non-pressure chronic ulcer of right ankle with fat layer exposed: Secondary | ICD-10-CM | POA: Diagnosis not present

## 2022-10-09 DIAGNOSIS — I87333 Chronic venous hypertension (idiopathic) with ulcer and inflammation of bilateral lower extremity: Secondary | ICD-10-CM | POA: Diagnosis not present

## 2022-10-09 DIAGNOSIS — M069 Rheumatoid arthritis, unspecified: Secondary | ICD-10-CM | POA: Diagnosis not present

## 2022-10-09 DIAGNOSIS — E11622 Type 2 diabetes mellitus with other skin ulcer: Secondary | ICD-10-CM | POA: Diagnosis not present

## 2022-10-09 DIAGNOSIS — L97822 Non-pressure chronic ulcer of other part of left lower leg with fat layer exposed: Secondary | ICD-10-CM | POA: Diagnosis not present

## 2022-10-09 NOTE — Progress Notes (Addendum)
Guzzo, [name] ([id number]) [id number] Page 1 of 7 Visit Report for 10/09/2022 Chief Complaint Document Details Patient Name: Date of Service: [name], [name] 10/09/2022 2:45 PM Medical Record Number: [id number] Patient Account Number: [id number] Date of Birth/Sex: Treating RN: 1961/01/29 (62 y.o. [name] Primary Care Provider: Yves Dill Other Clinician: Betha Loa Referring Provider: Treating Provider/Extender: Luan Moore, Neelam Weeks in Treatment: 16 Information Obtained from: Patient Chief Complaint Bilateral LE Ulcers Electronic Signature(s) Signed: 10/09/2022 3:06:33 PM By: Allen Derry PA-C Entered By: Allen Derry on 10/09/2022 15:06:33 -------------------------------------------------------------------------------- HPI Details Patient Name: Date of Service: [name], [name] 10/09/2022 2:45 PM Medical Record Number: [id number] Patient Account Number: [id number] Date of Birth/Sex: Treating RN: [birth year] (62 y.o. [name] Primary Care Provider: Yves Dill Other Clinician: Betha Loa Referring Provider: Treating Provider/Extender: Luan Moore, Neelam Weeks in Treatment: 16 History of Present Illness HPI Description: 06-19-2022 upon evaluation today patient appears to be doing somewhat poorly in regard to a wound that began around Christmas time on the patient's ankle and lower extremities bilaterally. He tells me that he has been having quite significant pain at this point unfortunately. He did go to the emergency department on March 2. However this has been going on since around Christmas. On March 2 he had x-rays that were negative and I did review that today as well. The patient also has what appears to be chronic venous stasis which is an ongoing issue for him here as well and I think that is part of the issue. Right now it is just hard for him to wear anything compression wise although long-term I think he is going to  need something. He does smoke currently that something that he needs to stop. Patient does have a history of diabetes mellitus type 2 which is stated to be diet controlled. He also again is a current smoker which I think is something that he needs to get rid of completely. 06-26-2022 upon evaluation today patient appears to be doing well currently in regard to his wounds all things considered he tells me the pain is a little bit better he has been on the correct antibiotic for only 2 days so he is seeing some improvement already this is already a good sign. Fortunately there does not appear to be any signs of infection locally nor systemically which is great news. No fevers, chills, nausea, vomiting, or diarrhea. 07-03-2022 upon evaluation today patient appears to be doing better in regard to his leg the infection is clearing very well and I am actually much more pleased today with where things stand compared to where they were. Fortunately I do not see any signs of active infection locally or systemically at this time which is great news. No fevers, chills, nausea, vomiting, or diarrhea. Swatzell, [name] ([id number]) [id number] Page 2 of 7 07-10-2022 upon evaluation today patient appears to be doing better I do not see any signs of infection I think he is much better in that regard. Fortunately I do not see any evidence of active infection locally or systemically which is great news. 07-17-2022 upon evaluation today patient actually appears to be doing better in regard to his wounds. He is showing some signs of improvement we actually now able to wrap him and I do think he is now allow me to do some debridement today as well which will also be good news. Fortunately I do not see any evidence of active infection locally nor systemically which is great  news. 07-24-2022 upon evaluation patient actually appears to be making good progress here and I am very pleased with where we stand. I  do think that he is showing signs of improvement I think that the medication is doing a good job as far as the new epithelial growth is concerned. He is in agreement with continuing with the current regimen the compression wrap has been beneficial the alginate did well but I do think we probably need to change this more than once a week. 07-31-2022 upon evaluation today patient appears to be doing well currently in regard to his wound. He has been tolerating the dressing changes on both legs without complication and overall I am extremely pleased actually with where we stand today. I do not see any signs of active infection 08-07-2022 upon evaluation today patient appears to be doing well currently in regard to his wounds. He is actually making excellent progress and in general I do feel like that he is really doing quite well. There does not appear to be any signs of active infection locally nor systemically which is great news. No fevers, chills, nausea, vomiting, or diarrhea. 08-14-2022 upon evaluation today patient appears to be doing well currently in regard to his wounds. He is actually been tolerating the dressing changes without complication. Fortunately there does not appear to be any signs of active infection locally nor systemically which is great news. No fevers, chills, nausea, vomiting, or diarrhea. 09-11-2022 upon evaluation today patient appears to be doing well currently in regard to his wounds he is actually been doing this himself as far as the dressing changes were concerned and seems to have done very well. Fortunately I do not see any signs of active infection locally nor systemically which is great news. 09-18-2022 upon evaluation today patient appears to be doing well currently in regard to his leg ulcers. In fact he just has a very small area on the back of his left leg at this point. He is showing signs of excellent improvement I am actually very pleased with where we stand today. I  do not see any signs of active infection locally nor systemically which is great news. 09-25-2022 upon evaluation today patient is doing well his wound is almost completely healed this is significantly smaller and looks to be doing great. Fortunately I do not see any evidence of infection at this time. 7/3; patient presents for follow-up. He has been using collagen to the wound bed under Tubigrip. He changes this 3 times weekly. He has no issues or complaints today. He denies signs of infection. 10-09-2022 upon evaluation today patient appears to be doing well currently in regard to his wound which is actually looking better and measuring better is just not completely done yet. Fortunately I do not see any evidence of infection at this time which is great news and in general I do think that we are making headway towards complete closure at this point. Electronic Signature(s) Signed: 10/09/2022 4:10:27 PM By: Allen Derry PA-C Entered By: Allen Derry on 10/09/2022 16:10:26 -------------------------------------------------------------------------------- Physical Exam Details Patient Name: Date of Service: [name], [name] 10/09/2022 2:45 PM Medical Record Number: [id number] Patient Account Number: [id number] Date of Birth/Sex: Treating RN: 12-07-1960 (62 y.o. [name] Primary Care Provider: Yves Dill Other Clinician: Betha Loa Referring Provider: Treating Provider/Extender: Luan Moore, Neelam Weeks in Treatment: 16 Constitutional Well-nourished and well-hydrated in no acute distress. Respiratory normal breathing without difficulty. Psychiatric this patient is able to make  decisions and demonstrates good insight into disease process. Alert and Oriented x 3. pleasant and cooperative. Notes Upon inspection patient's wound bed actually showed signs of good granulation epithelization at this point. Fortunately I do not see any signs of worsening I think that his wound is doing  well there is no debridement necessary and in general I do think we are making good progress and headway. Waggle, [name] ([id number]) [id number] Page 3 of 7 Electronic Signature(s) Signed: 10/09/2022 4:10:43 PM By: Allen Derry PA-C Entered By: Allen Derry on 10/09/2022 16:10:43 -------------------------------------------------------------------------------- Physician Orders Details Patient Name: Date of Service: [name], [name] 10/09/2022 2:45 PM Medical Record Number: [id number] Patient Account Number: [id number] Date of Birth/Sex: Treating RN: 02/20/1961 (62 y.o. [name] Primary Care Provider: Yves Dill Other Clinician: Betha Loa Referring Provider: Treating Provider/Extender: Luan Moore, Ezekiel Slocumb Weeks in Treatment: 16 Verbal / Phone Orders: Yes Clinician: Angelina Pih Read Back and Verified: Yes Diagnosis Coding ICD-10 Coding Code Description I87.333 Chronic venous hypertension (idiopathic) with ulcer and inflammation of bilateral lower extremity L97.312 Non-pressure chronic ulcer of right ankle with fat layer exposed L97.822 Non-pressure chronic ulcer of other part of left lower leg with fat layer exposed F17.218 Nicotine dependence, cigarettes, with other nicotine-induced disorders E11.622 Type 2 diabetes mellitus with other skin ulcer Follow-up Appointments Return Appointment in 1 week. Bathing/ Shower/ Hygiene May shower; gently cleanse wound with antibacterial soap, rinse and pat dry prior to dressing wounds Wound Treatment Wound #1 - Lower Leg Wound Laterality: Left, Circumferential Cleanser: Soap and Water 3 x Per Week/30 Days Discharge Instructions: Gently cleanse wound with antibacterial soap, rinse and pat dry prior to dressing wounds Prim Dressing: Promogran Matrix 4.34 (in) 3 x Per Week/30 Days ary Discharge Instructions: Moisten w/normal saline or sterile water; Cover wound as directed. Do not remove from wound  bed. Secondary Dressing: ABD Pad 5x9 (in/in) 3 x Per Week/30 Days Discharge Instructions: Cover with ABD pad Secondary Dressing: Kerlix 4.5 x 4.1 (in/yd) 3 x Per Week/30 Days Discharge Instructions: Apply Kerlix 4.5 x 4.1 (in/yd) as instructed Secured With: Tubigrip Size D, 3x10 (in/yd) 3 x Per Week/30 Days Discharge Instructions: Double Electronic Signature(s) Signed: 10/09/2022 5:17:09 PM By: Allen Derry PA-C Signed: 10/13/2022 4:35:54 PM By: Betha Loa Entered By: Betha Loa on 10/09/2022 15:23:02 Desta, [name] ([id number]) [id number] Page 4 of 7 -------------------------------------------------------------------------------- Problem List Details Patient Name: Date of Service: [name], [name] 10/09/2022 2:45 PM Medical Record Number: [id number] Patient Account Number: [id number] Date of Birth/Sex: Treating RN: 1960/05/17 (62 y.o. [name] Primary Care Provider: Yves Dill Other Clinician: Betha Loa Referring Provider: Treating Provider/Extender: Luan Moore, Neelam Weeks in Treatment: 16 Active Problems ICD-10 Encounter Code Description Active Date MDM Diagnosis I87.333 Chronic venous hypertension (idiopathic) with ulcer and inflammation of 06/19/2022 No Yes bilateral lower extremity L97.312 Non-pressure chronic ulcer of right ankle with fat layer exposed 06/19/2022 No Yes L97.822 Non-pressure chronic ulcer of other part of left lower leg with fat layer exposed3/21/2024 No Yes F17.218 Nicotine dependence, cigarettes, with other nicotine-induced disorders 06/19/2022 No Yes E11.622 Type 2 diabetes mellitus with other skin ulcer 06/19/2022 No Yes Inactive Problems Resolved Problems Electronic Signature(s) Signed: 10/09/2022 3:01:15 PM By: Allen Derry PA-C Entered By: Allen Derry on 10/09/2022 15:01:15 -------------------------------------------------------------------------------- Progress Note Details Patient Name: Date of  Service: [name], [name] 10/09/2022 2:45 PM Medical Record Number: [id number] Patient Account Number: [id number] Date of Birth/Sex: Treating RN: 10-30-1960 (62 y.o. [name] Bowdon, [name] ([id number]) [id number]  Page 5 of 7 Primary Care Provider: Yves Dill Other Clinician: Betha Loa Referring Provider: Treating Provider/Extender: Luan Moore, Neelam Weeks in Treatment: 16 Subjective Chief Complaint Information obtained from Patient Bilateral LE Ulcers History of Present Illness (HPI) 06-19-2022 upon evaluation today patient appears to be doing somewhat poorly in regard to a wound that began around Christmas time on the patient's ankle and lower extremities bilaterally. He tells me that he has been having quite significant pain at this point unfortunately. He did go to the emergency department on March 2. However this has been going on since around Christmas. On March 2 he had x-rays that were negative and I did review that today as well. The patient also has what appears to be chronic venous stasis which is an ongoing issue for him here as well and I think that is part of the issue. Right now it is just hard for him to wear anything compression wise although long-term I think he is going to need something. He does smoke currently that something that he needs to stop. Patient does have a history of diabetes mellitus type 2 which is stated to be diet controlled. He also again is a current smoker which I think is something that he needs to get rid of completely. 06-26-2022 upon evaluation today patient appears to be doing well currently in regard to his wounds all things considered he tells me the pain is a little bit better he has been on the correct antibiotic for only 2 days so he is seeing some improvement already this is already a good sign. Fortunately there does not appear to be any signs of infection locally nor systemically which is great news.  No fevers, chills, nausea, vomiting, or diarrhea. 07-03-2022 upon evaluation today patient appears to be doing better in regard to his leg the infection is clearing very well and I am actually much more pleased today with where things stand compared to where they were. Fortunately I do not see any signs of active infection locally or systemically at this time which is great news. No fevers, chills, nausea, vomiting, or diarrhea. 07-10-2022 upon evaluation today patient appears to be doing better I do not see any signs of infection I think he is much better in that regard. Fortunately I do not see any evidence of active infection locally or systemically which is great news. 07-17-2022 upon evaluation today patient actually appears to be doing better in regard to his wounds. He is showing some signs of improvement we actually now able to wrap him and I do think he is now allow me to do some debridement today as well which will also be good news. Fortunately I do not see any evidence of active infection locally nor systemically which is great news. 07-24-2022 upon evaluation patient actually appears to be making good progress here and I am very pleased with where we stand. I do think that he is showing signs of improvement I think that the medication is doing a good job as far as the new epithelial growth is concerned. He is in agreement with continuing with the current regimen the compression wrap has been beneficial the alginate did well but I do think we probably need to change this more than once a week. 07-31-2022 upon evaluation today patient appears to be doing well currently in regard to his wound. He has been tolerating the dressing changes on both legs without complication and overall I am extremely pleased actually with where  we stand today. I do not see any signs of active infection 08-07-2022 upon evaluation today patient appears to be doing well currently in regard to his wounds. He is actually  making excellent progress and in general I do feel like that he is really doing quite well. There does not appear to be any signs of active infection locally nor systemically which is great news. No fevers, chills, nausea, vomiting, or diarrhea. 08-14-2022 upon evaluation today patient appears to be doing well currently in regard to his wounds. He is actually been tolerating the dressing changes without complication. Fortunately there does not appear to be any signs of active infection locally nor systemically which is great news. No fevers, chills, nausea, vomiting, or diarrhea. 09-11-2022 upon evaluation today patient appears to be doing well currently in regard to his wounds he is actually been doing this himself as far as the dressing changes were concerned and seems to have done very well. Fortunately I do not see any signs of active infection locally nor systemically which is great news. 09-18-2022 upon evaluation today patient appears to be doing well currently in regard to his leg ulcers. In fact he just has a very small area on the back of his left leg at this point. He is showing signs of excellent improvement I am actually very pleased with where we stand today. I do not see any signs of active infection locally nor systemically which is great news. 09-25-2022 upon evaluation today patient is doing well his wound is almost completely healed this is significantly smaller and looks to be doing great. Fortunately I do not see any evidence of infection at this time. 7/3; patient presents for follow-up. He has been using collagen to the wound bed under Tubigrip. He changes this 3 times weekly. He has no issues or complaints today. He denies signs of infection. 10-09-2022 upon evaluation today patient appears to be doing well currently in regard to his wound which is actually looking better and measuring better is just not completely done yet. Fortunately I do not see any evidence of infection at  this time which is great news and in general I do think that we are making headway towards complete closure at this point. Objective Constitutional Well-nourished and well-hydrated in no acute distress. Vitals Time Taken: 2:56 PM, Height: 70 in, Weight: 180 lbs, BMI: 25.8, Temperature: 98.1 F, Pulse: 98 bpm, Respiratory Rate: 16 breaths/min, Blood Pressure: 128/78 mmHg. Respiratory normal breathing without difficulty. Doland, [name] ([id number]) [id number] Page 6 of 7 Psychiatric this patient is able to make decisions and demonstrates good insight into disease process. Alert and Oriented x 3. pleasant and cooperative. General Notes: Upon inspection patient's wound bed actually showed signs of good granulation epithelization at this point. Fortunately I do not see any signs of worsening I think that his wound is doing well there is no debridement necessary and in general I do think we are making good progress and headway. Integumentary (Hair, Skin) Wound #1 status is Open. Original cause of wound was Gradually Appeared. The date acquired was: 03/24/2022. The wound has been in treatment 16 weeks. The wound is located on the Left,Circumferential Lower Leg. The wound measures 1.7cm length x 0.5cm width x 0.1cm depth; 0.668cm^2 area and 0.067cm^3 volume. There is Fat Layer (Subcutaneous Tissue) exposed. There is a medium amount of serosanguineous drainage noted. There is medium (34-66%) red, pink granulation within the wound bed. There is a medium (34-66%) amount of necrotic tissue within the wound  bed including Adherent Slough. 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 Bathing/ Shower/ Hygiene: May shower; gently cleanse wound with antibacterial soap, rinse and pat dry prior to dressing wounds WOUND #1: - Lower Leg Wound Laterality: Left, Circumferential Cleanser: Soap and Water 3 x Per Week/30 Days Discharge Instructions: Gently cleanse wound with antibacterial soap, rinse and pat dry prior to dressing wounds Prim Dressing: Promogran Matrix 4.34 (in) 3 x Per Week/30 Days ary Discharge Instructions: Moisten w/normal saline or sterile water; Cover wound as directed. Do not remove from wound bed. Secondary Dressing: ABD Pad 5x9 (in/in) 3 x Per Week/30 Days Discharge Instructions: Cover with ABD pad Secondary Dressing: Kerlix 4.5 x 4.1 (in/yd) 3 x Per Week/30 Days Discharge Instructions: Apply Kerlix 4.5 x 4.1 (in/yd) as instructed Secured With: Tubigrip Size D, 3x10 (in/yd) 3 x Per Week/30 Days Discharge Instructions: Double 1. I would recommend that we have the patient continue to monitor for any evidence of infection or worsening. Based on what I am seeing I do think that we are making good progress towards closure. 2. I am also can recommend that the patient should continue with the collagen I think this is still can be the optimal way to go and in general I think that we are headed in the right direction. We will see patient back for reevaluation in 1 week here in the clinic. If anything worsens or changes patient will contact our office for additional recommendations. Electronic Signature(s) Signed: 10/09/2022 4:12:24 PM By: Allen Derry PA-C Entered By: Allen Derry on 10/09/2022 16:12:24 -------------------------------------------------------------------------------- SuperBill Details Patient Name: Date of Service: [name], [name] 10/09/2022 Kohler, [name] ([id number]) [phone].pdf Page 7 of 7 Medical Record  Number: [id number] Patient Account Number: [id number] Date of Birth/Sex: Treating RN: 04/01/1960 (62 y.o. [name] Primary Care Provider: Yves Dill Other Clinician: Betha Loa Referring Provider: Treating Provider/Extender: Luan Moore, Neelam Weeks in Treatment: 16 Diagnosis Coding ICD-10 Codes Code Description [phone] Chronic venous hypertension (idiopathic) with ulcer and inflammation of bilateral lower extremity L97.312 Non-pressure chronic ulcer of right ankle with fat layer exposed L97.822 Non-pressure chronic ulcer of other part of left lower leg with fat layer exposed F17.218 Nicotine dependence, cigarettes, with other nicotine-induced disorders E11.622 Type 2 diabetes mellitus with other skin ulcer Facility Procedures : CPT4 Code: [id number] Description: 99213 - WOUND CARE VISIT-LEV 3 EST PT Modifier: Quantity: 1 Physician Procedures : CPT4 Code Description Modifier [id number] - WC PHYS LEVEL 3 - EST PT ICD-10 Diagnosis Description I87.333 Chronic venous hypertension (idiopathic) with ulcer and inflammation of bilateral lower extremity L97.312 Non-pressure chronic ulcer of right  ankle with fat layer exposed L97.822 Non-pressure chronic ulcer of other part of left lower leg with fat layer exposed F17.218 Nicotine dependence, cigarettes, with other nicotine-induced disorders Quantity: 1 Electronic Signature(s) Signed: 10/09/2022 4:12:44 PM By: Allen Derry PA-C Entered By: Allen Derry on 10/09/2022 16:12:44

## 2022-10-13 NOTE — Progress Notes (Signed)
[name], [name] ([id number]) [id number] Page 1 of 9 Visit Report for 10/09/2022 Arrival Information Details Patient Name: Date of Service: [name] 10/09/2022 2:45 PM Medical Record Number: [id number] Patient Account Number: [id number] Date of Birth/Sex: Treating RN: 1960-06-07 (62 y.o. [name] Primary Care Alyn Jurney: Yves Dill Other Clinician: Betha Loa Referring Taelar Gronewold: Treating Kaelani Kendrick/Extender: Luan Moore, Neelam Weeks in Treatment: 16 Visit Information History Since Last Visit All ordered tests and consults were completed: No Patient Arrived: Ambulatory Added or deleted any medications: No Arrival Time: 14:55 Any new allergies or adverse reactions: No Transfer Assistance: None Had a fall or experienced change in No Patient Identification Verified: Yes activities of daily living that may affect Secondary Verification Process Completed: Yes risk of falls: Patient Requires Transmission-Based Precautions: No Signs or symptoms of abuse/neglect since last visito No Patient Has Alerts: No Hospitalized since last visit: No Implantable device outside of the clinic excluding No cellular tissue based products placed in the center since last visit: Has Dressing in Place as Prescribed: Yes Has Compression in Place as Prescribed: Yes Pain Present Now: No Electronic Signature(s) Signed: 10/13/2022 4:35:54 PM By: Betha Loa Entered By: Betha Loa on 10/09/2022 14:55:39 -------------------------------------------------------------------------------- Clinic Level of Care Assessment Details Patient Name: Date of Service: [name] 10/09/2022 2:45 PM Medical Record Number: [id number] Patient Account Number: [id number] Date of Birth/Sex: Treating RN: 1960-06-19 (62 y.o. [name] Primary Care Shakala Marlatt: Yves Dill Other Clinician: Betha Loa Referring Claudetta Sallie: Treating Aleesia Henney/Extender: Luan Moore,  Neelam Weeks in Treatment: 16 Clinic Level of Care Assessment Items TOOL 4 Quantity Score []  - 0 Use when only an EandM is performed on FOLLOW-UP visit ASSESSMENTS - Nursing Assessment / Reassessment X- 1 10 Reassessment of Co-morbidities (includes updates in patient status) [name], [name] ([id number]) [id number].pdf Page 2 of 9 X- 1 5 Reassessment of Adherence to Treatment Plan ASSESSMENTS - Wound and Skin A ssessment / Reassessment X - Simple Wound Assessment / Reassessment - one wound 1 5 []  - 0 Complex Wound Assessment / Reassessment - multiple wounds []  - 0 Dermatologic / Skin Assessment (not related to wound area) ASSESSMENTS - Focused Assessment []  - 0 Circumferential Edema Measurements - multi extremities []  - 0 Nutritional Assessment / Counseling / Intervention []  - 0 Lower Extremity Assessment (monofilament, tuning fork, pulses) []  - 0 Peripheral Arterial Disease Assessment (using hand held doppler) ASSESSMENTS - Ostomy and/or Continence Assessment and Care []  - 0 Incontinence Assessment and Management []  - 0 Ostomy Care Assessment and Management (repouching, etc.) PROCESS - Coordination of Care X - Simple Patient / Family Education for ongoing care 1 15 []  - 0 Complex (extensive) Patient / Family Education for ongoing care []  - 0 Staff obtains Chiropractor, Records, T Results / Process Orders est []  - 0 Staff telephones HHA, Nursing Homes / Clarify orders / etc []  - 0 Routine Transfer to another Facility (non-emergent condition) []  - 0 Routine Hospital Admission (non-emergent condition) []  - 0 New Admissions / Manufacturing engineer / Ordering NPWT Apligraf, etc. , []  - 0 Emergency Hospital Admission (emergent condition) X- 1 10 Simple Discharge Coordination []  - 0 Complex (extensive) Discharge Coordination PROCESS - Special Needs []  - 0 Pediatric / Minor Patient Management []  - 0 Isolation Patient Management []  - 0 Hearing /  Language / Visual special needs []  - 0 Assessment of Community assistance (transportation, D/C planning, etc.) []  - 0 Additional assistance / Altered mentation []  - 0 Support Surface(s) Assessment (bed, cushion, seat,  etc.) INTERVENTIONS - Wound Cleansing / Measurement X - Simple Wound Cleansing - one wound 1 5 []  - 0 Complex Wound Cleansing - multiple wounds X- 1 5 Wound Imaging (photographs - any number of wounds) []  - 0 Wound Tracing (instead of photographs) X- 1 5 Simple Wound Measurement - one wound []  - 0 Complex Wound Measurement - multiple wounds INTERVENTIONS - Wound Dressings []  - 0 Small Wound Dressing one or multiple wounds X- 1 15 Medium Wound Dressing one or multiple wounds []  - 0 Large Wound Dressing one or multiple wounds []  - 0 Application of Medications - topical []  - 0 Application of Medications - injection INTERVENTIONS - Miscellaneous [name], [name] ([id number]) [id number] Page 3 of 9 []  - 0 External ear exam []  - 0 Specimen Collection (cultures, biopsies, blood, body fluids, etc.) []  - 0 Specimen(s) / Culture(s) sent or taken to Lab for analysis []  - 0 Patient Transfer (multiple staff / Michiel Sites Lift / Similar devices) []  - 0 Simple Staple / Suture removal (25 or less) []  - 0 Complex Staple / Suture removal (26 or more) []  - 0 Hypo / Hyperglycemic Management (close monitor of Blood Glucose) []  - 0 Ankle / Brachial Index (ABI) - do not check if billed separately X- 1 5 Vital Signs Has the patient been seen at the hospital within the last three years: Yes Total Score: 80 Level Of Care: New/Established - Level 3 Electronic Signature(s) Signed: 10/13/2022 4:35:54 PM By: Betha Loa Entered By: Betha Loa on 10/09/2022 15:23:26 -------------------------------------------------------------------------------- Encounter Discharge Information Details Patient Name: Date of Service: [name] 10/09/2022 2:45 PM Medical  Record Number: [id number] Patient Account Number: [id number] Date of Birth/Sex: Treating RN: 1960/09/30 (62 y.o. [name] Primary Care Zayvion Stailey: Yves Dill Other Clinician: Betha Loa Referring Bulah Lurie: Treating Sylver Vantassell/Extender: Luan Moore, Neelam Weeks in Treatment: 16 Encounter Discharge Information Items Discharge Condition: Stable Ambulatory Status: Ambulatory Discharge Destination: Home Transportation: Private Auto Accompanied By: self Schedule Follow-up Appointment: Yes Clinical Summary of Care: Electronic Signature(s) Signed: 10/13/2022 4:35:54 PM By: Betha Loa Entered By: Betha Loa on 10/09/2022 17:11:42 [name], [name] ([id number]) [id number] Page 4 of 9 -------------------------------------------------------------------------------- Lower Extremity Assessment Details Patient Name: Date of Service: [name] 10/09/2022 2:45 PM Medical Record Number: [id number] Patient Account Number: [id number] Date of Birth/Sex: Treating RN: May 24, 1960 (62 y.o. [name] Primary Care Xue Low: Yves Dill Other Clinician: Betha Loa Referring Madalene Mickler: Treating Abimelec Grochowski/Extender: Luan Moore, Neelam Weeks in Treatment: 16 Edema Assessment Assessed: [Left: Yes] [Right: No] Edema: [Left: Ye] [Right: s] Calf Left: Right: Point of Measurement: 33 cm From Medial Instep 33.5 cm Ankle Left: Right: Point of Measurement: 12 cm From Medial Instep 21.3 cm Knee To Floor Left: Right: From Medial Instep 38.5 cm Vascular Assessment Pulses: Dorsalis Pedis Palpable: [Left:Yes] Electronic Signature(s) Signed: 10/09/2022 4:12:51 PM By: Angelina Pih Signed: 10/13/2022 4:35:54 PM By: Betha Loa Entered By: Betha Loa on 10/09/2022 15:04:51 -------------------------------------------------------------------------------- Multi Wound Chart Details Patient Name: Date of Service: [name] 10/09/2022 2:45  PM Medical Record Number: [id number] Patient Account Number: [id number] Date of Birth/Sex: Treating RN: 1960/10/11 (62 y.o. [name] Primary Care Tekelia Kareem: Yves Dill Other Clinician: Betha Loa Referring Daron Stutz: Treating Shanera Meske/Extender: Luan Moore, Neelam Weeks in Treatment: 16 Vital Signs Height(in): 70 Pulse(bpm): 98 Weight(lbs): 180 Blood Pressure(mmHg): 128/78 Body Mass Index(BMI): 25.8 Temperature(F): 98.1 Respiratory Rate(breaths/min): 16 [1:Photos:] [N/A:N/A] Left, Circumferential Lower Leg N/A N/A Wound Location: Gradually Appeared N/A N/A Wounding Event: Venous  Leg Ulcer N/A N/A Primary Etiology: Rheumatoid Arthritis N/A N/A Comorbid History: 03/24/2022 N/A N/A Date Acquired: 39 N/A N/A Weeks of Treatment: Open N/A N/A Wound Status: No N/A N/A Wound Recurrence: 1.7x0.5x0.1 N/A N/A Measurements L x W x D (cm) 0.668 N/A N/A A (cm) : rea 0.067 N/A N/A Volume (cm) : 99.30% N/A N/A % Reduction in A rea: 99.30% N/A N/A % Reduction in Volume: Full Thickness Without Exposed N/A N/A Classification: Support Structures Medium N/A N/A Exudate A mount: Serosanguineous N/A N/A Exudate Type: red, brown N/A N/A Exudate Color: Medium (34-66%) N/A N/A Granulation A mount: Red, Pink N/A N/A Granulation Quality: Medium (34-66%) N/A N/A Necrotic A mount: Fat Layer (Subcutaneous Tissue): Yes N/A N/A Exposed Structures: None N/A N/A Epithelialization: Treatment Notes Electronic Signature(s) Signed: 10/13/2022 4:35:54 PM By: Betha Loa Entered By: Betha Loa on 10/09/2022 15:04:59 -------------------------------------------------------------------------------- Multi-Disciplinary Care Plan Details Patient Name: Date of Service: [name] 10/09/2022 2:45 PM Medical Record Number: [id number] Patient Account Number: [id number] Date of Birth/Sex: Treating RN: 03/11/1961 (62 y.o. [name] Primary Care Nataki Mccrumb: Yves Dill Other Clinician: Betha Loa Referring Maida Widger: Treating Montina Dorrance/Extender: Luan Moore, Neelam Weeks in Treatment: 16 Active Inactive Wound/Skin Impairment Nursing Diagnoses: Impaired tissue integrity Knowledge deficit related to ulceration/compromised skin integrity Goals: Ulcer/skin breakdown will have a volume reduction of 30% by week 4 Date Initiated: 06/19/2022 Date Inactivated: 07/17/2022 Target Resolution Date: 07/17/2022 Goal Status: Met Ulcer/skin breakdown will have a volume reduction of 50% by week 8 Date Initiated: 06/19/2022 Date Inactivated: 08/14/2022 Target Resolution Date: 08/14/2022 Goal Status: Met Ulcer/skin breakdown will have a volume reduction of 80% by week 12 [name], [name] ([id number]) [phone].pdf Page 6 of 9 Date Initiated: 06/19/2022 Date Inactivated: 09/18/2022 Target Resolution Date: 09/11/2022 Goal Status: Met Ulcer/skin breakdown will heal within 14 weeks Date Initiated: 06/19/2022 Target Resolution Date: 10/29/2022 Goal Status: Active Interventions: Assess patient/caregiver ability to obtain necessary supplies Assess patient/caregiver ability to perform ulcer/skin care regimen upon admission and as needed Assess ulceration(s) every visit Provide education on ulcer and skin care Treatment Activities: Referred to DME Philip Kotlyar for dressing supplies : 06/19/2022 Skin care regimen initiated : 06/19/2022 Notes: Electronic Signature(s) Signed: 10/10/2022 1:24:08 PM By: Angelina Pih Signed: 10/13/2022 4:35:54 PM By: Betha Loa Entered By: Betha Loa on 10/09/2022 17:10:47 -------------------------------------------------------------------------------- Pain Assessment Details Patient Name: Date of Service: [name] 10/09/2022 2:45 PM Medical Record Number: [id number] Patient Account Number: [id number] Date of Birth/Sex: Treating RN: 03/19/1961 (62 y.o. [name] Primary Care Itzamara Casas: Yves Dill Other Clinician: Betha Loa Referring Branden Vine: Treating Jaki Steptoe/Extender: Luan Moore, Neelam Weeks in Treatment: 16 Active Problems Location of Pain Severity and Description of Pain Patient Has Paino No Site Locations Pain Management and Medication Current Pain Management: Electronic Signature(s) Signed: 10/09/2022 4:12:51 PM By: Deboraha Sprang ([id number]) [id number] Page 7 of 9 Signed: 10/13/2022 4:35:54 PM By: Betha Loa Entered By: Betha Loa on 10/09/2022 14:57:40 -------------------------------------------------------------------------------- Patient/Caregiver Education Details Patient Name: Date of Service: [name] 7/11/2024andnbsp2:45 PM Medical Record Number: [id number] Patient Account Number: [id number] Date of Birth/Gender: Treating RN: 17-Aug-1960 (62 y.o. [name] Primary Care Physician: Yves Dill Other Clinician: Betha Loa Referring Physician: Treating Physician/Extender: Vania Rea Weeks in Treatment: 16 Education Assessment Education Provided To: Patient Education Topics Provided Wound/Skin Impairment: Handouts: Other: continue wound care as directed Methods: Explain/Verbal Responses: State content correctly Electronic Signature(s) Signed: 10/13/2022 4:35:54 PM By: Betha Loa Entered By: Betha Loa on  10/09/2022 17:11:08 -------------------------------------------------------------------------------- Wound Assessment Details Patient Name: Date of Service: [name] 10/09/2022 2:45 PM Medical Record Number: [id number] Patient Account Number: [id number] Date of Birth/Sex: Treating RN: Jul 26, 1960 (62 y.o. [name] Primary Care Abegail Kloeppel: Yves Dill Other Clinician: Betha Loa Referring Precilla Purnell: Treating Anael Rosch/Extender: Luan Moore, Neelam Weeks in Treatment: 16 Wound Status Wound Number: 1 Primary Etiology: Venous Leg  Ulcer Wound Location: Left, Circumferential Lower Leg Wound Status: Open Wounding Event: Gradually Appeared Comorbid History: Rheumatoid Arthritis Date Acquired: 03/24/2022 Weeks Of Treatment: 16 Clustered Wound: No [name], [name] ([id number]) [phone].pdf Page 8 of 9 Photos Wound Measurements Length: (cm) 1.7 Width: (cm) 0.5 Depth: (cm) 0.1 Area: (cm) 0.668 Volume: (cm) 0.067 % Reduction in Area: 99.3% % Reduction in Volume: 99.3% Epithelialization: None Wound Description Classification: Full Thickness Without Exposed Suppor Exudate Amount: Medium Exudate Type: Serosanguineous Exudate Color: red, brown t Structures Foul Odor After Cleansing: No Slough/Fibrino Yes Wound Bed Granulation Amount: Medium (34-66%) Exposed Structure Granulation Quality: Red, Pink Fat Layer (Subcutaneous Tissue) Exposed: Yes Necrotic Amount: Medium (34-66%) Necrotic Quality: Adherent Slough Treatment Notes Wound #1 (Lower Leg) Wound Laterality: Left, Circumferential Cleanser Soap and Water Discharge Instruction: Gently cleanse wound with antibacterial soap, rinse and pat dry prior to dressing wounds Peri-Wound Care Topical Primary Dressing Promogran Matrix 4.34 (in) Discharge Instruction: Moisten w/normal saline or sterile water; Cover wound as directed. Do not remove from wound bed. Secondary Dressing ABD Pad 5x9 (in/in) Discharge Instruction: Cover with ABD pad Kerlix 4.5 x 4.1 (in/yd) Discharge Instruction: Apply Kerlix 4.5 x 4.1 (in/yd) as instructed Secured With Tubigrip Size D, 3x10 (in/yd) Discharge Instruction: Double Compression Wrap Compression Stockings Add-Ons Electronic Signature(s) Signed: 10/09/2022 4:12:51 PM By: Angelina Pih Signed: 10/13/2022 4:35:54 PM By: Betha Loa Entered By: Betha Loa on 10/09/2022 15:03:54 Metsker, Carel ([id number]) [id number] Page 9 of  9 -------------------------------------------------------------------------------- Vitals Details Patient Name: Date of Service: [name] 10/09/2022 2:45 PM Medical Record Number: [id number] Patient Account Number: [id number] Date of Birth/Sex: Treating RN: [birth year] (62 y.o. [name] Primary Care German Manke: Yves Dill Other Clinician: Betha Loa Referring Josephina Melcher: Treating Bennet Kujawa/Extender: Luan Moore, Neelam Weeks in Treatment: 16 Vital Signs Time Taken: 14:56 Temperature (F): 98.1 Height (in): 70 Pulse (bpm): 98 Weight (lbs): 180 Respiratory Rate (breaths/min): 16 Body Mass Index (BMI): 25.8 Blood Pressure (mmHg): 128/78 Reference Range: 80 - 120 mg / dl Electronic Signature(s) Signed: 10/13/2022 4:35:54 PM By: Betha Loa Entered By: Betha Loa on 10/09/2022 14:57:36

## 2022-10-16 ENCOUNTER — Encounter: Payer: 59 | Admitting: Physician Assistant

## 2022-10-16 DIAGNOSIS — M069 Rheumatoid arthritis, unspecified: Secondary | ICD-10-CM | POA: Diagnosis not present

## 2022-10-16 DIAGNOSIS — E11622 Type 2 diabetes mellitus with other skin ulcer: Secondary | ICD-10-CM | POA: Diagnosis not present

## 2022-10-16 DIAGNOSIS — I87333 Chronic venous hypertension (idiopathic) with ulcer and inflammation of bilateral lower extremity: Secondary | ICD-10-CM | POA: Diagnosis not present

## 2022-10-16 DIAGNOSIS — I872 Venous insufficiency (chronic) (peripheral): Secondary | ICD-10-CM | POA: Diagnosis not present

## 2022-10-16 DIAGNOSIS — L97312 Non-pressure chronic ulcer of right ankle with fat layer exposed: Secondary | ICD-10-CM | POA: Diagnosis not present

## 2022-10-16 DIAGNOSIS — L97822 Non-pressure chronic ulcer of other part of left lower leg with fat layer exposed: Secondary | ICD-10-CM | POA: Diagnosis not present

## 2022-10-16 NOTE — Progress Notes (Signed)
Bordley, [name] ([id number]) [id number] Page 1 of 9 Visit Report for 10/16/2022 Arrival Information Details Patient Name: Date of Service: [name], [name] 10/16/2022 1:00 PM Medical Record Number: [id number] Patient Account Number: [id number] Date of Birth/Sex: Treating RN: 1960-08-14 (62 y.o. [name] Primary Care Chancy Claros: [name] Other Clinician: Referring Kody Vigil: Treating [name]/Extender: [name], Neelam Weeks in Treatment: 17 Visit Information History Since Last Visit Added or deleted any medications: No Patient Arrived: Ambulatory Any new allergies or adverse reactions: No Arrival Time: 13:01 Had a fall or experienced change in No Accompanied By: self activities of daily living that may affect Transfer Assistance: None risk of falls: Patient Identification Verified: Yes Hospitalized since last visit: No Secondary Verification Process Completed: Yes Has Dressing in Place as Prescribed: Yes Patient Requires Transmission-Based Precautions: No Has Compression in Place as Prescribed: Yes Patient Has Alerts: No Pain Present Now: No Electronic Signature(s) Signed: 10/16/2022 4:35:02 PM By: Angelina Pih Entered By: Angelina Pih on 10/16/2022 13:01:54 -------------------------------------------------------------------------------- Clinic Level of Care Assessment Details Patient Name: Date of Service: [name], [name] 10/16/2022 1:00 PM Medical Record Number: [id number] Patient Account Number: [id number] Date of Birth/Sex: Treating RN: [birth year] (62 y.o. [name] Primary Care Cyan Clippinger: [name] Other Clinician: Referring [name]: Treating [name]/Extender: [name], Neelam Weeks in Treatment: 17 Clinic Level of Care Assessment Items TOOL 4 Quantity Score []  - 0 Use when only an EandM is performed on FOLLOW-UP visit ASSESSMENTS - Nursing Assessment / Reassessment X- 1 10 Reassessment of Co-morbidities  (includes updates in patient status) X- 1 5 Reassessment of Adherence to Treatment Plan ASSESSMENTS - Wound and Skin A ssessment / Reassessment X - Simple Wound Assessment / Reassessment - one wound 1 5 [name], [name] ([id number]) [id number] Page 2 of 9 []  - 0 Complex Wound Assessment / Reassessment - multiple wounds []  - 0 Dermatologic / Skin Assessment (not related to wound area) ASSESSMENTS - Focused Assessment []  - 0 Circumferential Edema Measurements - multi extremities []  - 0 Nutritional Assessment / Counseling / Intervention []  - 0 Lower Extremity Assessment (monofilament, tuning fork, pulses) []  - 0 Peripheral Arterial Disease Assessment (using hand held doppler) ASSESSMENTS - Ostomy and/or Continence Assessment and Care []  - 0 Incontinence Assessment and Management []  - 0 Ostomy Care Assessment and Management (repouching, etc.) PROCESS - Coordination of Care X - Simple Patient / Family Education for ongoing care 1 15 []  - 0 Complex (extensive) Patient / Family Education for ongoing care X- 1 10 Staff obtains Chiropractor, Records, T Results / Process Orders est []  - 0 Staff telephones HHA, Nursing Homes / Clarify orders / etc []  - 0 Routine Transfer to another Facility (non-emergent condition) []  - 0 Routine Hospital Admission (non-emergent condition) []  - 0 New Admissions / Manufacturing engineer / Ordering NPWT Apligraf, etc. , []  - 0 Emergency Hospital Admission (emergent condition) X- 1 10 Simple Discharge Coordination []  - 0 Complex (extensive) Discharge Coordination PROCESS - Special Needs []  - 0 Pediatric / Minor Patient Management []  - 0 Isolation Patient Management []  - 0 Hearing / Language / Visual special needs []  - 0 Assessment of Community assistance (transportation, D/C planning, etc.) []  - 0 Additional assistance / Altered mentation []  - 0 Support Surface(s) Assessment (bed, cushion, seat, etc.) INTERVENTIONS -  Wound Cleansing / Measurement X - Simple Wound Cleansing - one wound 1 5 []  - 0 Complex Wound Cleansing - multiple wounds X- 1 5 Wound Imaging (photographs - any number of wounds) []  -  0 Wound Tracing (instead of photographs) X- 1 5 Simple Wound Measurement - one wound []  - 0 Complex Wound Measurement - multiple wounds INTERVENTIONS - Wound Dressings X - Small Wound Dressing one or multiple wounds 1 10 []  - 0 Medium Wound Dressing one or multiple wounds []  - 0 Large Wound Dressing one or multiple wounds X- 1 5 Application of Medications - topical []  - 0 Application of Medications - injection INTERVENTIONS - Miscellaneous []  - 0 External ear exam []  - 0 Specimen Collection (cultures, biopsies, blood, body fluids, etc.) []  - 0 Specimen(s) / Culture(s) sent or taken to Lab for analysis [name], [name] ([id number]) [id number] Page 3 of 9 []  - 0 Patient Transfer (multiple staff / Nurse, adult / Similar devices) []  - 0 Simple Staple / Suture removal (25 or less) []  - 0 Complex Staple / Suture removal (26 or more) []  - 0 Hypo / Hyperglycemic Management (close monitor of Blood Glucose) []  - 0 Ankle / Brachial Index (ABI) - do not check if billed separately X- 1 5 Vital Signs Has the patient been seen at the hospital within the last three years: Yes Total Score: 90 Level Of Care: New/Established - Level 3 Electronic Signature(s) Signed: 10/16/2022 4:35:02 PM By: Angelina Pih Entered By: Angelina Pih on 10/16/2022 13:26:09 -------------------------------------------------------------------------------- Encounter Discharge Information Details Patient Name: Date of Service: [name], [name] 10/16/2022 1:00 PM Medical Record Number: [id number] Patient Account Number: [id number] Date of Birth/Sex: Treating RN: 1960-05-17 (62 y.o. [name] Primary Care Commodore Bellew: [name] Other Clinician: Referring [name]: Treating [name]/Extender: [name], Neelam Weeks in Treatment: 17 Encounter Discharge Information Items Discharge Condition: Stable Ambulatory Status: Ambulatory Discharge Destination: Home Transportation: Private Auto Accompanied By: self Schedule Follow-up Appointment: Yes Clinical Summary of Care: Electronic Signature(s) Signed: 10/16/2022 4:35:02 PM By: Angelina Pih Entered By: Angelina Pih on 10/16/2022 13:32:13 -------------------------------------------------------------------------------- Lower Extremity Assessment Details Patient Name: Date of Service: [name], [name] 10/16/2022 1:00 PM Medical Record Number: [id number] Patient Account Number: [id number] Date of Birth/Sex: Treating RN: 1960-07-12 (62 y.o. [name] Primary Care Jearldean Gutt: [name] Other Clinician: Alecia Lemming ([id number]) [id number] Page 4 of 9 Referring Ayisha Pol: Treating Kharisma Glasner/Extender: [name], Neelam Weeks in Treatment: 17 Edema Assessment Assessed: [Left: No] [Right: No] Edema: [Left: Ye] [Right: s] Calf Left: Right: Point of Measurement: 33 cm From Medial Instep 33.9 cm Ankle Left: Right: Point of Measurement: 12 cm From Medial Instep 22 cm Vascular Assessment Pulses: Dorsalis Pedis Palpable: [Left:Yes] Electronic Signature(s) Signed: 10/16/2022 4:35:02 PM By: Angelina Pih Entered By: Angelina Pih on 10/16/2022 13:07:32 -------------------------------------------------------------------------------- Multi Wound Chart Details Patient Name: Date of Service: [name], [name] 10/16/2022 1:00 PM Medical Record Number: [id number] Patient Account Number: [id number] Date of Birth/Sex: Treating RN: 1960/12/07 (62 y.o. [name] Primary Care Lauri Till: [name] Other Clinician: Referring Roth Ress: Treating Rashay Barnette/Extender: [name], Neelam Weeks in Treatment: 17 Vital Signs Height(in): 70 Pulse(bpm): 96 Weight(lbs): 180 Blood Pressure(mmHg):  131/73 Body Mass Index(BMI): 25.8 Temperature(F): 98 Respiratory Rate(breaths/min): 18 [1:Photos:] [N/A:N/A] Left, Circumferential Lower Leg N/A N/A Wound Location: Gradually Appeared N/A N/A Wounding Event: Venous Leg Ulcer N/A N/A Primary Etiology: Rheumatoid Arthritis N/A N/A Comorbid History: 03/24/2022 N/A N/A Date Acquired: 48 N/A N/A Weeks of Treatment: Lemberger, Greggory ([id number]) [id number] Page 5 of 9 Open N/A N/A Wound Status: No N/A N/A Wound Recurrence: 1.5x1x0.1 N/A N/A Measurements L x W x D (cm) 1.178 N/A N/A A (cm) : rea 0.118 N/A N/A  Volume (cm) : 98.70% N/A N/A % Reduction in Area: 98.70% N/A N/A % Reduction in Volume: Full Thickness Without Exposed N/A N/A Classification: Support Structures Medium N/A N/A Exudate A mount: Serosanguineous N/A N/A Exudate Type: red, brown N/A N/A Exudate Color: Medium (34-66%) N/A N/A Granulation A mount: Red, Pink N/A N/A Granulation Quality: Medium (34-66%) N/A N/A Necrotic A mount: Fat Layer (Subcutaneous Tissue): Yes N/A N/A Exposed Structures: None N/A N/A Epithelialization: Treatment Notes Electronic Signature(s) Signed: 10/16/2022 4:35:02 PM By: Angelina Pih Entered By: Angelina Pih on 10/16/2022 13:10:05 -------------------------------------------------------------------------------- Multi-Disciplinary Care Plan Details Patient Name: Date of Service: [name], [name] 10/16/2022 1:00 PM Medical Record Number: [id number] Patient Account Number: [id number] Date of Birth/Sex: Treating RN: Feb 19, 1961 (62 y.o. [name] Primary Care Yoseph Haile: [name] Other Clinician: Referring Terrace Fontanilla: Treating Brittiny Levitz/Extender: [name], Neelam Weeks in Treatment: 17 Active Inactive Wound/Skin Impairment Nursing Diagnoses: Impaired tissue integrity Knowledge deficit related to ulceration/compromised skin integrity Goals: Ulcer/skin breakdown will have a volume  reduction of 30% by week 4 Date Initiated: 06/19/2022 Date Inactivated: 07/17/2022 Target Resolution Date: 07/17/2022 Goal Status: Met Ulcer/skin breakdown will have a volume reduction of 50% by week 8 Date Initiated: 06/19/2022 Date Inactivated: 08/14/2022 Target Resolution Date: 08/14/2022 Goal Status: Met Ulcer/skin breakdown will have a volume reduction of 80% by week 12 Date Initiated: 06/19/2022 Date Inactivated: 09/18/2022 Target Resolution Date: 09/11/2022 Goal Status: Met Ulcer/skin breakdown will heal within 14 weeks Date Initiated: 06/19/2022 Target Resolution Date: 10/29/2022 Goal Status: Active Interventions: Assess patient/caregiver ability to obtain necessary supplies Assess patient/caregiver ability to perform ulcer/skin care regimen upon admission and as needed Assess ulceration(s) every visit Provide education on ulcer and skin care Haywood, Deven ([id number]) [id number] Page 6 of 9 Treatment Activities: Referred to DME Seylah Wernert for dressing supplies : 06/19/2022 Skin care regimen initiated : 06/19/2022 Notes: Electronic Signature(s) Signed: 10/16/2022 4:35:02 PM By: Angelina Pih Entered By: Angelina Pih on 10/16/2022 13:31:18 -------------------------------------------------------------------------------- Pain Assessment Details Patient Name: Date of Service: [name], [name] 10/16/2022 1:00 PM Medical Record Number: [id number] Patient Account Number: [id number] Date of Birth/Sex: Treating RN: 12-23-1960 (62 y.o. [name] Primary Care Beautiful Pensyl: [name] Other Clinician: Referring Sanjuanita Condrey: Treating Ahuva Poynor/Extender: [name], Neelam Weeks in Treatment: 17 Active Problems Location of Pain Severity and Description of Pain Patient Has Paino No Site Locations Rate the pain. Current Pain Level: 0 Pain Management and Medication Current Pain Management: Electronic Signature(s) Signed: 10/16/2022 4:35:02 PM By: Angelina Pih Entered By: Angelina Pih on 10/16/2022 13:03:39 Etzkorn, [name] ([id number]) [id number] Page 7 of 9 -------------------------------------------------------------------------------- Patient/Caregiver Education Details Patient Name: Date of Service: [name], [name] 7/18/2024andnbsp1:00 PM Medical Record Number: [id number] Patient Account Number: [id number] Date of Birth/Gender: Treating RN: 1960/04/17 (62 y.o. [name] Primary Care Physician: [name] Other Clinician: Referring Physician: Treating Physician/Extender: Leslee Home in Treatment: 17 Education Assessment Education Provided To: Patient Education Topics Provided Wound/Skin Impairment: Handouts: Caring for Your Ulcer Methods: Explain/Verbal Responses: State content correctly Electronic Signature(s) Signed: 10/16/2022 4:35:02 PM By: Angelina Pih Entered By: Angelina Pih on 10/16/2022 13:31:30 -------------------------------------------------------------------------------- Wound Assessment Details Patient Name: Date of Service: [name], [name] 10/16/2022 1:00 PM Medical Record Number: [id number] Patient Account Number: [id number] Date of Birth/Sex: Treating RN: [birth year] (62 y.o. [name] Primary Care Ladarion Munyon: [name] Other Clinician: Referring Ahad Colarusso: Treating Kyliee Ortego/Extender: [name], Neelam Weeks in Treatment: 17 Wound Status Wound Number: 1 Primary Etiology: Venous Leg Ulcer Wound Location: Left,  Circumferential Lower Leg Wound Status: Open Wounding Event: Gradually Appeared Comorbid History: Rheumatoid Arthritis Date Acquired: 03/24/2022 Weeks Of Treatment: 17 Clustered Wound: No Photos Doody, Malic ([id number]) [phone].pdf Page 8 of 9 Wound Measurements Length: (cm) 1.5 Width: (cm) 1 Depth: (cm) 0.1 Area: (cm) 1.178 Volume: (cm) 0.118 % Reduction in Area: 98.7% % Reduction in Volume:  98.7% Epithelialization: None Tunneling: No Undermining: No Wound Description Classification: Full Thickness Without Exposed Suppor Exudate Amount: Medium Exudate Type: Serosanguineous Exudate Color: red, brown t Structures Foul Odor After Cleansing: No Slough/Fibrino Yes Wound Bed Granulation Amount: Medium (34-66%) Exposed Structure Granulation Quality: Red, Pink Fat Layer (Subcutaneous Tissue) Exposed: Yes Necrotic Amount: Medium (34-66%) Necrotic Quality: Adherent Slough Treatment Notes Wound #1 (Lower Leg) Wound Laterality: Left, Circumferential Cleanser Soap and Water Discharge Instruction: Gently cleanse wound with antibacterial soap, rinse and pat dry prior to dressing wounds Peri-Wound Care Topical Primary Dressing Silvercel Small 2x2 (in/in) Discharge Instruction: Apply Silvercel Small 2x2 (in/in) as instructed Secondary Dressing ABD Pad 5x9 (in/in) Discharge Instruction: Cover with ABD pad Conforming Guaze Roll-Medium Discharge Instruction: Apply Conforming Stretch Guaze Bandage as directed Secured With Tubigrip Size D, 3x10 (in/yd) Discharge Instruction: Double Compression Wrap Compression Stockings Add-Ons Electronic Signature(s) Signed: 10/16/2022 4:35:02 PM By: Angelina Pih Entered By: Angelina Pih on 10/16/2022 13:09:30 Bendavid, [name] ([id number]) [id number] Page 9 of 9 -------------------------------------------------------------------------------- Vitals Details Patient Name: Date of Service: [name], [name] 10/16/2022 1:00 PM Medical Record Number: [id number] Patient Account Number: [id number] Date of Birth/Sex: Treating RN: Aug 10, 1960 (63 y.o. [name] Primary Care Drayce Tawil: [name] Other Clinician: Referring Zanyiah Posten: Treating Melena Hayes/Extender: [name], Neelam Weeks in Treatment: 17 Vital Signs Time Taken: 13:01 Temperature (F): 98 Height (in): 70 Pulse (bpm): 96 Weight (lbs):  180 Respiratory Rate (breaths/min): 18 Body Mass Index (BMI): 25.8 Blood Pressure (mmHg): 131/73 Reference Range: 80 - 120 mg / dl Electronic Signature(s) Signed: 10/16/2022 4:35:02 PM By: Angelina Pih Entered By: Angelina Pih on 10/16/2022 13:03:32

## 2022-10-16 NOTE — Progress Notes (Addendum)
[name] ([id number]) [id number] Page 1 of 7 Visit Report for 10/16/2022 Chief Complaint Document Details Patient Name: Date of Service: [name], [name] 10/16/2022 1:00 PM Medical Record Number: [id number] Patient Account Number: [id number] Date of Birth/Sex: Treating RN: Nov 27, 1960 (62 y.o. [name] Primary Care Provider: Yves Dill Other Clinician: Referring Provider: Treating Provider/Extender: Luan Moore, Neelam Weeks in Treatment: 17 Information Obtained from: Patient Chief Complaint Bilateral LE Ulcers Electronic Signature(s) Signed: 10/16/2022 1:06:46 PM By: Allen Derry PA-C Entered By: Allen Derry on 10/16/2022 13:06:46 -------------------------------------------------------------------------------- HPI Details Patient Name: Date of Service: [name], [name] 10/16/2022 1:00 PM Medical Record Number: [id number] Patient Account Number: [id number] Date of Birth/Sex: Treating RN: September 21, 1960 (62 y.o. [name] Primary Care Provider: Yves Dill Other Clinician: Referring Provider: Treating Provider/Extender: Luan Moore, Neelam Weeks in Treatment: 17 History of Present Illness HPI Description: 06-19-2022 upon evaluation today patient appears to be doing somewhat poorly in regard to a wound that began around Christmas time on the patient's ankle and lower extremities bilaterally. He tells me that he has been having quite significant pain at this point unfortunately. He did go to the emergency department on March 2. However this has been going on since around Christmas. On March 2 he had x-rays that were negative and I did review that today as well. The patient also has what appears to be chronic venous stasis which is an ongoing issue for him here as well and I think that is part of the issue. Right now it is just hard for him to wear anything compression wise although long-term I think he is going to need something. He does smoke  currently that something that he needs to stop. Patient does have a history of diabetes mellitus type 2 which is stated to be diet controlled. He also again is a current smoker which I think is something that he needs to get rid of completely. 06-26-2022 upon evaluation today patient appears to be doing well currently in regard to his wounds all things considered he tells me the pain is a little bit better he has been on the correct antibiotic for only 2 days so he is seeing some improvement already this is already a good sign. Fortunately there does not appear to be any signs of infection locally nor systemically which is great news. No fevers, chills, nausea, vomiting, or diarrhea. 07-03-2022 upon evaluation today patient appears to be doing better in regard to his leg the infection is clearing very well and I am actually much more pleased today with where things stand compared to where they were. Fortunately I do not see any signs of active infection locally or systemically at this time which is great news. No fevers, chills, nausea, vomiting, or diarrhea. Scheerer, Ree [name] ([id number]) [id number] Page 2 of 7 07-10-2022 upon evaluation today patient appears to be doing better I do not see any signs of infection I think he is much better in that regard. Fortunately I do not see any evidence of active infection locally or systemically which is great news. 07-17-2022 upon evaluation today patient actually appears to be doing better in regard to his wounds. He is showing some signs of improvement we actually now able to wrap him and I do think he is now allow me to do some debridement today as well which will also be good news. Fortunately I do not see any evidence of active infection locally nor systemically which is great news. 07-24-2022 upon evaluation  patient actually appears to be making good progress here and I am very pleased with where we stand. I do think that he is  showing signs of improvement I think that the medication is doing a good job as far as the new epithelial growth is concerned. He is in agreement with continuing with the current regimen the compression wrap has been beneficial the alginate did well but I do think we probably need to change this more than once a week. 07-31-2022 upon evaluation today patient appears to be doing well currently in regard to his wound. He has been tolerating the dressing changes on both legs without complication and overall I am extremely pleased actually with where we stand today. I do not see any signs of active infection 08-07-2022 upon evaluation today patient appears to be doing well currently in regard to his wounds. He is actually making excellent progress and in general I do feel like that he is really doing quite well. There does not appear to be any signs of active infection locally nor systemically which is great news. No fevers, chills, nausea, vomiting, or diarrhea. 08-14-2022 upon evaluation today patient appears to be doing well currently in regard to his wounds. He is actually been tolerating the dressing changes without complication. Fortunately there does not appear to be any signs of active infection locally nor systemically which is great news. No fevers, chills, nausea, vomiting, or diarrhea. 09-11-2022 upon evaluation today patient appears to be doing well currently in regard to his wounds he is actually been doing this himself as far as the dressing changes were concerned and seems to have done very well. Fortunately I do not see any signs of active infection locally nor systemically which is great news. 09-18-2022 upon evaluation today patient appears to be doing well currently in regard to his leg ulcers. In fact he just has a very small area on the back of his left leg at this point. He is showing signs of excellent improvement I am actually very pleased with where we stand today. I do not see any  signs of active infection locally nor systemically which is great news. 09-25-2022 upon evaluation today patient is doing well his wound is almost completely healed this is significantly smaller and looks to be doing great. Fortunately I do not see any evidence of infection at this time. 7/3; patient presents for follow-up. He has been using collagen to the wound bed under Tubigrip. He changes this 3 times weekly. He has no issues or complaints today. He denies signs of infection. 10-09-2022 upon evaluation today patient appears to be doing well currently in regard to his wound which is actually looking better and measuring better is just not completely done yet. Fortunately I do not see any evidence of infection at this time which is great news and in general I do think that we are making headway towards complete closure at this point. 10-16-2022 upon evaluation today patient appears to be doing well currently in regard to his wound. He is tolerating the dressing changes but is also not significantly smaller I was hoping the collagen would speed things up unfortunately that does not seem to be the case. I do not see any evidence of infection at this point. Electronic Signature(s) Signed: 10/16/2022 1:27:23 PM By: Allen Derry PA-C Entered By: Allen Derry on 10/16/2022 13:27:22 -------------------------------------------------------------------------------- Physical Exam Details Patient Name: Date of Service: [name], [name] 10/16/2022 1:00 PM Medical Record Number: [id number] Patient Account Number:  [id number] Date of Birth/Sex: Treating RN: Nov 11, 1960 (62 y.o. [name] Primary Care Provider: Yves Dill Other Clinician: Referring Provider: Treating Provider/Extender: Luan Moore, Neelam Weeks in Treatment: 17 Constitutional Well-nourished and well-hydrated in no acute distress. Respiratory normal breathing without difficulty. Psychiatric this patient is able to make decisions  and demonstrates good insight into disease process. Alert and Oriented x 3. pleasant and cooperative. Notes Schaaf, Jermain ([id number]) [id number] Page 3 of 7 Upon inspection patient's wound bed actually showed signs of good granulation there was no sharp debridement necessary today and in general he seems to be making really good progress. I am very pleased with where we stand currently. Electronic Signature(s) Signed: 10/16/2022 1:27:34 PM By: Allen Derry PA-C Entered By: Allen Derry on 10/16/2022 13:27:34 -------------------------------------------------------------------------------- Physician Orders Details Patient Name: Date of Service: [name], [name] 10/16/2022 1:00 PM Medical Record Number: [id number] Patient Account Number: [id number] Date of Birth/Sex: Treating RN: 1960-05-03 (62 y.o. [name] Primary Care Provider: Yves Dill Other Clinician: Referring Provider: Treating Provider/Extender: Luan Moore, Neelam Weeks in Treatment: 17 Verbal / Phone Orders: No Diagnosis Coding ICD-10 Coding Code Description I87.333 Chronic venous hypertension (idiopathic) with ulcer and inflammation of bilateral lower extremity L97.312 Non-pressure chronic ulcer of right ankle with fat layer exposed L97.822 Non-pressure chronic ulcer of other part of left lower leg with fat layer exposed F17.218 Nicotine dependence, cigarettes, with other nicotine-induced disorders E11.622 Type 2 diabetes mellitus with other skin ulcer Follow-up Appointments Return Appointment in 1 week. Bathing/ Shower/ Hygiene May shower; gently cleanse wound with antibacterial soap, rinse and pat dry prior to dressing wounds Wound Treatment Wound #1 - Lower Leg Wound Laterality: Left, Circumferential Cleanser: Soap and Water 3 x Per Week/30 Days Discharge Instructions: Gently cleanse wound with antibacterial soap, rinse and pat dry prior to dressing wounds Prim Dressing: Silvercel  Small 2x2 (in/in) 3 x Per Week/30 Days ary Discharge Instructions: Apply Silvercel Small 2x2 (in/in) as instructed Secondary Dressing: ABD Pad 5x9 (in/in) 3 x Per Week/30 Days Discharge Instructions: Cover with ABD pad Secondary Dressing: Conforming Guaze Roll-Medium 3 x Per Week/30 Days Discharge Instructions: Apply Conforming Stretch Guaze Bandage as directed Secured With: Tubigrip Size D, 3x10 (in/yd) 3 x Per Week/30 Days Discharge Instructions: Double Electronic Signature(s) Signed: 10/16/2022 4:35:02 PM By: Angelina Pih Signed: 10/27/2022 3:00:23 PM By: Allen Derry PA-C Entered By: Angelina Pih on 10/16/2022 13:25:49 Browning, Ree [name] ([id number]) [id number] Page 4 of 7 -------------------------------------------------------------------------------- Problem List Details Patient Name: Date of Service: [name], [name] 10/16/2022 1:00 PM Medical Record Number: [id number] Patient Account Number: [id number] Date of Birth/Sex: Treating RN: [birth year] (62 y.o. [name] Primary Care Provider: Yves Dill Other Clinician: Referring Provider: Treating Provider/Extender: Luan Moore, Neelam Weeks in Treatment: 17 Active Problems ICD-10 Encounter Code Description Active Date MDM Diagnosis I87.333 Chronic venous hypertension (idiopathic) with ulcer and inflammation of 06/19/2022 No Yes bilateral lower extremity L97.312 Non-pressure chronic ulcer of right ankle with fat layer exposed 06/19/2022 No Yes L97.822 Non-pressure chronic ulcer of other part of left lower leg with fat layer exposed3/21/2024 No Yes F17.218 Nicotine dependence, cigarettes, with other nicotine-induced disorders 06/19/2022 No Yes E11.622 Type 2 diabetes mellitus with other skin ulcer 06/19/2022 No Yes Inactive Problems Resolved Problems Electronic Signature(s) Signed: 10/16/2022 1:30:06 PM By: Allen Derry PA-C Previous Signature: 10/16/2022 1:29:49 PM Version By: Allen Derry  PA-C Previous Signature: 10/16/2022 1:06:42 PM Version By: Allen Derry PA-C Entered By: Allen Derry on 10/16/2022 13:30:06 -------------------------------------------------------------------------------- Progress Note Details  Patient Name: Date of Service: [name], [name] 10/16/2022 1:00 PM Serres, Elfego ([id number]) [id number] Page 5 of 7 Medical Record Number: [id number] Patient Account Number: [id number] Date of Birth/Sex: Treating RN: [birth year] (62 y.o. [name] Primary Care Provider: Yves Dill Other Clinician: Referring Provider: Treating Provider/Extender: Luan Moore, Neelam Weeks in Treatment: 17 Subjective Chief Complaint Information obtained from Patient Bilateral LE Ulcers History of Present Illness (HPI) 06-19-2022 upon evaluation today patient appears to be doing somewhat poorly in regard to a wound that began around Christmas time on the patient's ankle and lower extremities bilaterally. He tells me that he has been having quite significant pain at this point unfortunately. He did go to the emergency department on March 2. However this has been going on since around Christmas. On March 2 he had x-rays that were negative and I did review that today as well. The patient also has what appears to be chronic venous stasis which is an ongoing issue for him here as well and I think that is part of the issue. Right now it is just hard for him to wear anything compression wise although long-term I think he is going to need something. He does smoke currently that something that he needs to stop. Patient does have a history of diabetes mellitus type 2 which is stated to be diet controlled. He also again is a current smoker which I think is something that he needs to get rid of completely. 06-26-2022 upon evaluation today patient appears to be doing well currently in regard to his wounds all things considered he tells me the pain is a little bit  better he has been on the correct antibiotic for only 2 days so he is seeing some improvement already this is already a good sign. Fortunately there does not appear to be any signs of infection locally nor systemically which is great news. No fevers, chills, nausea, vomiting, or diarrhea. 07-03-2022 upon evaluation today patient appears to be doing better in regard to his leg the infection is clearing very well and I am actually much more pleased today with where things stand compared to where they were. Fortunately I do not see any signs of active infection locally or systemically at this time which is great news. No fevers, chills, nausea, vomiting, or diarrhea. 07-10-2022 upon evaluation today patient appears to be doing better I do not see any signs of infection I think he is much better in that regard. Fortunately I do not see any evidence of active infection locally or systemically which is great news. 07-17-2022 upon evaluation today patient actually appears to be doing better in regard to his wounds. He is showing some signs of improvement we actually now able to wrap him and I do think he is now allow me to do some debridement today as well which will also be good news. Fortunately I do not see any evidence of active infection locally nor systemically which is great news. 07-24-2022 upon evaluation patient actually appears to be making good progress here and I am very pleased with where we stand. I do think that he is showing signs of improvement I think that the medication is doing a good job as far as the new epithelial growth is concerned. He is in agreement with continuing with the current regimen the compression wrap has been beneficial the alginate did well but I do think we probably need to change this more than once a week. 07-31-2022 upon evaluation today  patient appears to be doing well currently in regard to his wound. He has been tolerating the dressing changes on both legs without  complication and overall I am extremely pleased actually with where we stand today. I do not see any signs of active infection 08-07-2022 upon evaluation today patient appears to be doing well currently in regard to his wounds. He is actually making excellent progress and in general I do feel like that he is really doing quite well. There does not appear to be any signs of active infection locally nor systemically which is great news. No fevers, chills, nausea, vomiting, or diarrhea. 08-14-2022 upon evaluation today patient appears to be doing well currently in regard to his wounds. He is actually been tolerating the dressing changes without complication. Fortunately there does not appear to be any signs of active infection locally nor systemically which is great news. No fevers, chills, nausea, vomiting, or diarrhea. 09-11-2022 upon evaluation today patient appears to be doing well currently in regard to his wounds he is actually been doing this himself as far as the dressing changes were concerned and seems to have done very well. Fortunately I do not see any signs of active infection locally nor systemically which is great news. 09-18-2022 upon evaluation today patient appears to be doing well currently in regard to his leg ulcers. In fact he just has a very small area on the back of his left leg at this point. He is showing signs of excellent improvement I am actually very pleased with where we stand today. I do not see any signs of active infection locally nor systemically which is great news. 09-25-2022 upon evaluation today patient is doing well his wound is almost completely healed this is significantly smaller and looks to be doing great. Fortunately I do not see any evidence of infection at this time. 7/3; patient presents for follow-up. He has been using collagen to the wound bed under Tubigrip. He changes this 3 times weekly. He has no issues or complaints today. He denies signs of  infection. 10-09-2022 upon evaluation today patient appears to be doing well currently in regard to his wound which is actually looking better and measuring better is just not completely done yet. Fortunately I do not see any evidence of infection at this time which is great news and in general I do think that we are making headway towards complete closure at this point. 10-16-2022 upon evaluation today patient appears to be doing well currently in regard to his wound. He is tolerating the dressing changes but is also not significantly smaller I was hoping the collagen would speed things up unfortunately that does not seem to be the case. I do not see any evidence of infection at this point. Objective Constitutional Well-nourished and well-hydrated in no acute distress. Vangorden, Ree [name] ([id number]) [id number] Page 6 of 7 Vitals Time Taken: 1:01 PM, Height: 70 in, Weight: 180 lbs, BMI: 25.8, Temperature: 98 F, Pulse: 96 bpm, Respiratory Rate: 18 breaths/min, Blood Pressure: 131/73 mmHg. Respiratory normal breathing without difficulty. Psychiatric this patient is able to make decisions and demonstrates good insight into disease process. Alert and Oriented x 3. pleasant and cooperative. General Notes: Upon inspection patient's wound bed actually showed signs of good granulation there was no sharp debridement necessary today and in general he seems to be making really good progress. I am very pleased with where we stand currently. Integumentary (Hair, Skin) Wound #1 status is Open. Original cause of wound was Gradually  Appeared. The date acquired was: 03/24/2022. The wound has been in treatment 17 weeks. The wound is located on the Left,Circumferential Lower Leg. The wound measures 1.5cm length x 1cm width x 0.1cm depth; 1.178cm^2 area and 0.118cm^3 volume. There is Fat Layer (Subcutaneous Tissue) exposed. There is no tunneling or undermining noted. There is a medium amount of  serosanguineous drainage noted. There is medium (34-66%) red, pink granulation within the wound bed. There is a medium (34-66%) amount of necrotic tissue within the wound bed including Adherent Slough. 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 Bathing/ Shower/ Hygiene: May shower; gently cleanse wound with antibacterial soap, rinse and pat dry prior to dressing wounds WOUND #1: - Lower Leg Wound Laterality: Left, Circumferential Cleanser: Soap and Water 3 x Per Week/30 Days Discharge Instructions: Gently cleanse wound with antibacterial soap, rinse and pat dry prior to dressing wounds Prim Dressing: Silvercel Small 2x2 (in/in) 3 x Per Week/30 Days ary Discharge Instructions: Apply Silvercel Small 2x2 (in/in) as instructed Secondary Dressing: ABD Pad 5x9 (in/in) 3 x Per Week/30 Days Discharge Instructions: Cover with ABD pad Secondary Dressing: Conforming Guaze Roll-Medium 3 x Per Week/30 Days Discharge Instructions: Apply Conforming Stretch Guaze Bandage as directed Secured With: Tubigrip Size D, 3x10 (in/yd) 3 x Per Week/30 Days Discharge Instructions: Double 1. I would recommend that we have the patient continue to monitor for any signs of infection or worsening. Right now we will switch back over to the silver cell which I think was doing better form before I was hoping the collagen finishes off but it just really has not done well for that. 2. Will also continue with ABD pad to cover and roll gauze secure in place along with Tubigrip size D. We will see patient back for  reevaluation in 1 week here in the clinic. If anything worsens or changes patient will contact our office for additional recommendations. Electronic Signature(s) Signed: 10/16/2022 1:28:43 PM By: Allen Derry PA-C Entered By: Allen Derry on 10/16/2022 13:28:43 Kugler, Ree [name] ([id number]) [id number] Page 7 of 7 -------------------------------------------------------------------------------- SuperBill Details Patient Name: Date of Service: [name], [name] 10/16/2022 Medical Record Number: [id number] Patient Account Number: [id number] Date of Birth/Sex: Treating RN: 12-17-1960 (62 y.o. [name] Primary Care Provider: Yves Dill Other Clinician: Referring Provider: Treating Provider/Extender: Luan Moore, Neelam Weeks in Treatment: 17 Diagnosis Coding ICD-10 Codes Code Description I87.333 Chronic venous hypertension (idiopathic) with ulcer and inflammation of bilateral lower extremity L97.312 Non-pressure chronic ulcer of right ankle with fat layer exposed L97.822 Non-pressure chronic ulcer of other part of left lower leg with fat layer exposed F17.218 Nicotine dependence, cigarettes, with other nicotine-induced disorders E11.622 Type 2 diabetes mellitus with other skin ulcer Facility Procedures : CPT4 Code: [id number] Description: 99213 - WOUND CARE VISIT-LEV 3 EST PT Modifier: Quantity: 1 Physician Procedures : CPT4 Code Description Modifier [id number] 99213 - WC PHYS LEVEL 3 - EST PT ICD-10 Diagnosis Description I87.333 Chronic venous hypertension (idiopathic) with ulcer and inflammation of bilateral lower extremity L97.312 Non-pressure chronic ulcer of right  ankle with fat layer exposed L97.822 Non-pressure chronic ulcer of other 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

## 2022-10-23 ENCOUNTER — Ambulatory Visit: Payer: PRIVATE HEALTH INSURANCE | Admitting: Internal Medicine

## 2022-10-27 ENCOUNTER — Ambulatory Visit: Payer: PRIVATE HEALTH INSURANCE | Admitting: Physician Assistant

## 2024-02-02 ENCOUNTER — Other Ambulatory Visit: Payer: Self-pay

## 2024-02-02 ENCOUNTER — Emergency Department: Payer: Self-pay

## 2024-02-02 ENCOUNTER — Inpatient Hospital Stay
Admission: EM | Admit: 2024-02-02 | Discharge: 2024-02-05 | DRG: 603 | Disposition: A | Discharge status: Home / Self Care | Payer: Self-pay | Attending: Internal Medicine | Admitting: Internal Medicine

## 2024-02-02 DIAGNOSIS — Z803 Family history of malignant neoplasm of breast: Secondary | ICD-10-CM

## 2024-02-02 DIAGNOSIS — L03115 Cellulitis of right lower limb: Principal | ICD-10-CM | POA: Diagnosis present

## 2024-02-02 DIAGNOSIS — S81801A Unspecified open wound, right lower leg, initial encounter: Principal | ICD-10-CM

## 2024-02-02 DIAGNOSIS — F17210 Nicotine dependence, cigarettes, uncomplicated: Secondary | ICD-10-CM | POA: Diagnosis present

## 2024-02-02 DIAGNOSIS — L03116 Cellulitis of left lower limb: Secondary | ICD-10-CM | POA: Diagnosis present

## 2024-02-02 DIAGNOSIS — M069 Rheumatoid arthritis, unspecified: Secondary | ICD-10-CM | POA: Diagnosis present

## 2024-02-02 DIAGNOSIS — R030 Elevated blood-pressure reading, without diagnosis of hypertension: Secondary | ICD-10-CM | POA: Diagnosis present

## 2024-02-02 DIAGNOSIS — R7302 Impaired glucose tolerance (oral): Secondary | ICD-10-CM | POA: Diagnosis present

## 2024-02-02 DIAGNOSIS — E1165 Type 2 diabetes mellitus with hyperglycemia: Secondary | ICD-10-CM | POA: Diagnosis present

## 2024-02-02 DIAGNOSIS — Z833 Family history of diabetes mellitus: Secondary | ICD-10-CM

## 2024-02-02 DIAGNOSIS — S81809A Unspecified open wound, unspecified lower leg, initial encounter: Secondary | ICD-10-CM | POA: Insufficient documentation

## 2024-02-02 LAB — CBC WITH DIFFERENTIAL/PLATELET
Abs Immature Granulocytes: 0.02 K/uL (ref 0.00–0.07)
Basophils Absolute: 0 K/uL (ref 0.0–0.1)
Basophils Relative: 0 %
Eosinophils Absolute: 0.3 K/uL (ref 0.0–0.5)
Eosinophils Relative: 4 %
HCT: 38.4 % — ABNORMAL LOW (ref 39.0–52.0)
Hemoglobin: 12.6 g/dL — ABNORMAL LOW (ref 13.0–17.0)
Immature Granulocytes: 0 %
Lymphocytes Relative: 12 %
Lymphs Abs: 0.9 K/uL (ref 0.7–4.0)
MCH: 29 pg (ref 26.0–34.0)
MCHC: 32.8 g/dL (ref 30.0–36.0)
MCV: 88.3 fL (ref 80.0–100.0)
Monocytes Absolute: 0.7 K/uL (ref 0.1–1.0)
Monocytes Relative: 9 %
Neutro Abs: 5.5 K/uL (ref 1.7–7.7)
Neutrophils Relative %: 75 %
Platelets: 285 K/uL (ref 150–400)
RBC: 4.35 MIL/uL (ref 4.22–5.81)
RDW: 13.2 % (ref 11.5–15.5)
WBC: 7.4 K/uL (ref 4.0–10.5)
nRBC: 0 % (ref 0.0–0.2)

## 2024-02-02 LAB — LIPID PANEL
Cholesterol: 121 mg/dL (ref 0–200)
HDL: 58 mg/dL (ref >40)
LDL Cholesterol: 52 mg/dL (ref 0–99)
Total CHOL/HDL Ratio: 2.1 ratio
Triglycerides: 53 mg/dL (ref <150)
VLDL: 11 mg/dL (ref 0–40)

## 2024-02-02 LAB — COMPREHENSIVE METABOLIC PANEL WITH GFR
ALT: 17 U/L (ref 0–44)
AST: 23 U/L (ref 15–41)
Albumin: 3 g/dL — ABNORMAL LOW (ref 3.5–5.0)
Alkaline Phosphatase: 96 U/L (ref 38–126)
Anion gap: 10 (ref 5–15)
BUN: 14 mg/dL (ref 8–23)
CO2: 28 mmol/L (ref 22–32)
Calcium: 8.3 mg/dL — ABNORMAL LOW (ref 8.9–10.3)
Chloride: 99 mmol/L (ref 98–111)
Creatinine, Ser: 0.74 mg/dL (ref 0.61–1.24)
GFR, Estimated: >60 mL/min (ref >60)
Glucose, Bld: 177 mg/dL — ABNORMAL HIGH (ref 70–99)
Potassium: 3.5 mmol/L (ref 3.5–5.1)
Sodium: 137 mmol/L (ref 135–145)
Total Bilirubin: 0.8 mg/dL (ref 0.0–1.2)
Total Protein: 8 g/dL (ref 6.5–8.1)

## 2024-02-02 LAB — MAGNESIUM: Magnesium: 2.1 mg/dL (ref 1.7–2.4)

## 2024-02-02 LAB — LACTIC ACID, PLASMA: Lactic Acid, Venous: 1.5 mmol/L (ref 0.5–1.9)

## 2024-02-02 MED ORDER — MORPHINE SULFATE (PF) 4 MG/ML IV SOLN
4.0000 mg | Freq: Once | INTRAVENOUS | Status: AC
  Administered 2024-02-02: 4 mg via INTRAVENOUS
  Filled 2024-02-02: qty 1

## 2024-02-02 MED ORDER — OXYCODONE HCL 5 MG PO TABS
5.0000 mg | ORAL_TABLET | ORAL | Status: DC | PRN
  Administered 2024-02-03 – 2024-02-04 (×2): 5 mg via ORAL
  Filled 2024-02-02 (×2): qty 1

## 2024-02-02 MED ORDER — GABAPENTIN 100 MG PO CAPS
100.0000 mg | ORAL_CAPSULE | Freq: Every day | ORAL | Status: DC
  Administered 2024-02-02 – 2024-02-04 (×3): 100 mg via ORAL
  Filled 2024-02-02 (×3): qty 1

## 2024-02-02 MED ORDER — SODIUM CHLORIDE 0.9% FLUSH
3.0000 mL | Freq: Two times a day (BID) | INTRAVENOUS | Status: DC
  Administered 2024-02-02 – 2024-02-05 (×4): 3 mL via INTRAVENOUS

## 2024-02-02 MED ORDER — NICOTINE 7 MG/24HR TD PT24
7.0000 mg | MEDICATED_PATCH | Freq: Every day | TRANSDERMAL | Status: DC
  Filled 2024-02-02 (×4): qty 1

## 2024-02-02 MED ORDER — VANCOMYCIN HCL IN DEXTROSE 1-5 GM/200ML-% IV SOLN
1000.0000 mg | Freq: Once | INTRAVENOUS | Status: AC
  Administered 2024-02-02: 1000 mg via INTRAVENOUS
  Filled 2024-02-02: qty 200

## 2024-02-02 MED ORDER — ENOXAPARIN SODIUM 40 MG/0.4ML IJ SOSY
40.0000 mg | PREFILLED_SYRINGE | INTRAMUSCULAR | Status: DC
  Administered 2024-02-02 – 2024-02-04 (×3): 40 mg via SUBCUTANEOUS
  Filled 2024-02-02 (×3): qty 0.4

## 2024-02-02 MED ORDER — ACETAMINOPHEN 650 MG RE SUPP: 650.0000 mg | Freq: Four times a day (QID) | RECTAL | Status: DC | PRN

## 2024-02-02 MED ORDER — HYDROMORPHONE HCL 1 MG/ML IJ SOLN
0.5000 mg | Freq: Once | INTRAMUSCULAR | Status: AC
  Administered 2024-02-02: 0.5 mg via INTRAVENOUS
  Filled 2024-02-02: qty 0.5

## 2024-02-02 MED ORDER — ACETAMINOPHEN 325 MG PO TABS
650.0000 mg | ORAL_TABLET | Freq: Four times a day (QID) | ORAL | Status: DC | PRN
Start: 2024-02-02 — End: 2024-02-05
  Administered 2024-02-03: 650 mg via ORAL
  Filled 2024-02-02: qty 2

## 2024-02-02 MED ORDER — SENNOSIDES-DOCUSATE SODIUM 8.6-50 MG PO TABS: 1.0000 | ORAL_TABLET | Freq: Every evening | ORAL | Status: DC | PRN

## 2024-02-02 MED ORDER — SODIUM CHLORIDE 0.9 % IV SOLN
1.0000 g | Freq: Once | INTRAVENOUS | Status: AC
  Administered 2024-02-02: 1 g via INTRAVENOUS
  Filled 2024-02-02: qty 10

## 2024-02-02 MED ORDER — HYDROMORPHONE HCL 1 MG/ML IJ SOLN
0.5000 mg | INTRAMUSCULAR | Status: DC | PRN
  Administered 2024-02-02 – 2024-02-05 (×3): 1 mg via INTRAVENOUS
  Filled 2024-02-02 (×3): qty 1

## 2024-02-02 NOTE — ED Provider Notes (Signed)
 [facility] Provider Note    Event Date/Time   First MD Initiated Contact with Patient 02/02/24 1624     (approximate)   History   Chief Complaint Wound Check   HPI  [name] is a 63 y.o. male with past medical history of diabetes and rheumatoid arthritis who presents to the ED complaining of wound check.  Patient reports that he has been dealing with chronic wounds to both of his lower legs for the past couple of years.  He was previously following with the wound care center, but states the wounds were getting better and so he has not seen them in about a year.  He states that the wounds have then begun to worsen over the past couple of months, especially wound to his right lower leg that has gotten very bad over the past 2 weeks.  He reports increased redness, swelling, pain, and blistering around his right heel.  He has not had any fevers but states it is difficult for him to bear weight on his right leg.  He is not currently following with a PCP or podiatry, does not take medication for diabetes.     Physical Exam   Triage Vital Signs: ED Triage Vitals  Encounter Vitals Group     BP 02/02/24 1447 129/80     Girls Systolic BP Percentile --      Girls Diastolic BP Percentile --      Boys Systolic BP Percentile --      Boys Diastolic BP Percentile --      Pulse Rate 02/02/24 1447 (!) 106     Resp 02/02/24 1447 18     Temp 02/02/24 1447 (!) 97.5 F (36.4 C)     Temp Source 02/02/24 1447 Oral     SpO2 02/02/24 1447 100 %     Weight 02/02/24 1446 180 lb (81.6 kg)     Height 02/02/24 1446 5' 9 (1.753 m)     Head Circumference --      Peak Flow --      Pain Score 02/02/24 1442 0     Pain Loc --      Pain Education --      Exclude from Growth Chart --     Most recent vital signs: Vitals:   02/02/24 1447  BP: 129/80  Pulse: (!) 106  Resp: 18  Temp: (!) 97.5 F (36.4 C)  SpO2: 100%    Constitutional: Alert and oriented. Eyes: Conjunctivae  are normal. Head: Atraumatic. Nose: No congestion/rhinnorhea. Mouth/Throat: Mucous membranes are moist.  Cardiovascular: Normal rate, regular rhythm. Grossly normal heart sounds.  2+ radial and DP pulses bilaterally. Respiratory: Normal respiratory effort.  No retractions. Lungs CTAB. Gastrointestinal: Soft and nontender. No distention. Musculoskeletal: Large circumferential wound to the right lower leg with surrounding erythema, edema, warmth, and purulent drainage.  Smaller wound noted to posterior left lower leg without any purulence, erythema, or edema. Neurologic:  Normal speech and language. No gross focal neurologic deficits are appreciated.    ED Results / Procedures / Treatments   Labs (all labs ordered are listed, but only abnormal results are displayed) Labs Reviewed  COMPREHENSIVE METABOLIC PANEL WITH GFR - Abnormal; Notable for the following components:      Result Value   Glucose, Bld 177 (*)    Calcium 8.3 (*)    Albumin 3.0 (*)    All other components within normal limits  CBC WITH DIFFERENTIAL/PLATELET - Abnormal; Notable for the  following components:   Hemoglobin 12.6 (*)    HCT 38.4 (*)    All other components within normal limits  LACTIC ACID, PLASMA    RADIOLOGY X-ray of right ankle reviewed and interpreted by me with no obvious signs of osteomyelitis, fracture, or dislocation.  PROCEDURES:  Critical Care performed: No  Procedures   MEDICATIONS ORDERED IN ED: Medications  cefTRIAXone  (ROCEPHIN ) 1 g in sodium chloride  0.9 % 100 mL IVPB (1 g Intravenous New Bag/Given 02/02/24 1707)  vancomycin  (VANCOCIN ) IVPB 1000 mg/200 mL premix (has no administration in time range)  morphine  (PF) 4 MG/ML injection 4 mg (4 mg Intravenous Given 02/02/24 1703)     IMPRESSION / MDM / ASSESSMENT AND PLAN / ED COURSE  I reviewed the triage vital signs and the nursing notes.                              63 y.o. male with past medical history of diabetes and rheumatoid  arthritis who presents to the ED complaining of acute worsening of chronic wound to his right lower extremity over the past couple of weeks.  Patient's presentation is most consistent with acute presentation with potential threat to life or bodily function.  Differential diagnosis includes, but is not limited to, cellulitis, abscess, osteomyelitis, arterial insufficiency, venous insufficiency.  Patient nontoxic-appearing and in no acute distress, vital signs remarkable for tachycardia but otherwise reassuring.  He is neurovascular intact to his bilateral lower extremities with strong DP pulses, but does have signs of significant cellulitis associated with wound to his right lower extremity.  Labs without significant anemia, leukocytosis, electrolyte abnormality, or AKI.  LFTs are unremarkable and lactic acid within normal limits.  No findings to suggest sepsis at this time but given extensive cellulitis, we will start on IV antibiotics.  X-ray without evidence of osteomyelitis, case discussed with hospitalist for admission.      FINAL CLINICAL IMPRESSION(S) / ED DIAGNOSES   Final diagnoses:  Wound of right lower extremity, initial encounter  Cellulitis of right lower extremity     Rx / DC Orders   ED Discharge Orders     None        Note:  This document was prepared using Dragon voice recognition software and may include unintentional dictation errors.   Willo Dunnings, MD 02/02/24 1728

## 2024-02-02 NOTE — H&P (Signed)
 History and Physical    [name] FMW:[id number] DOB: 08-30-1960 DOA: 02/02/2024  DOS: the patient was seen and examined on 02/02/2024  PCP: Pcp, No   Patient coming from: Home  I have personally briefly reviewed patient's old medical records in [facility] and CareEverywhere  HPI:   [name] is a 63 y.o. year old male with past medical history of rheumatoid arthritis, type 2 diabetes presenting to the ED for worsening of his chronic wounds.   Pt states over the last 4 to 5 days he has been having worsening right lower extremity wound.  These wounds been having a problem for him for about 2 years but they are getting better so he stopped following with wound clinic and his PCP as there were only changing his bandages which he states he could do himself.  He states the right leg has been having some oozing which is clear but is very tender to touch and has become quite red over the last few days.  He denies any fevers or chills.   On arrival to the ED patient was noted to be HDS stable.  Lab work and imaging CBC without leukocytosis, mild anemia that is stable.  CMP with significant hyperglycemia at 177 and mild hypocalcemia 8.3.  Lactic acid checked and normal.  Right lower extremity x-ray obtained that did not show osteomyelitis but did show soft tissue edema as can be seen in cellulitis.  EDP started patient on ceftriaxone  and vancomycin .  Given need for continued care, [facility] contacted for admission.  Review of Systems: As mentioned in the history of present illness. All other systems reviewed and are negative.   History reviewed. No pertinent past medical history.  Past Surgical History:  Procedure Laterality Date   fractured right ankle       No Known Allergies  Family History  Problem Relation Age of Onset   Diabetes Mother    Breast cancer Mother    Cirrhosis Father     Prior to Admission medications   Medication Sig Start Date End Date Taking? Authorizing Provider   sulfamethoxazole-trimethoprim (BACTRIM DS) 800-160 MG tablet Take 1 tablet by mouth 2 (two) times daily.    [provider]    Social History:  reports that he has been smoking cigarettes. He has never used smokeless tobacco. He reports that he does not currently use alcohol. He reports that he does not use drugs. Lives with his wife Tobacco- 1/4 pack per day EtOH-rare alcohol use Illicit drug use- denies use.  IADLs/ADLs- can perform independently at baseline    Physical Exam: Vitals:   02/02/24 1715 02/02/24 1730 02/02/24 1749 02/02/24 1935  BP:  (!) 162/87 (!) 162/87 (!) 154/91  Pulse:  80 90 99  Resp:   18   Temp:    98.4 F (36.9 C)  TempSrc:    Oral  SpO2: 100% (!) 74% 100% 99%  Weight:    79.6 kg  Height:    5' 9 (1.753 m)     Gen: NAD HENT: NCAT CV: Regular rate and rhythm, good pulses in all extremities including bilateral feet Resp: CTAB Abd: No TTP, normal bowel sounds MSK: No symmetry, bilateral lower extremities with wounds with some acuteness of the right lower extremity.  See media tab for images Neuro: Alert and oriented x 4 Psych: Pleasant mood   Labs on Admission: I have personally reviewed following labs and imaging studies  CBC: Recent Labs  Lab 02/02/24 1448  WBC 7.4  NEUTROABS 5.5  HGB 12.6*  HCT 38.4*  MCV 88.3  PLT 285   Basic Metabolic Panel: Recent Labs  Lab 02/02/24 1448  NA 137  K 3.5  CL 99  CO2 28  GLUCOSE 177*  BUN 14  CREATININE 0.74  CALCIUM 8.3*  MG 2.1   GFR: Estimated Creatinine Clearance: 97 mL/min (by C-G formula based on SCr of 0.74 mg/dL). Liver Function Tests: Recent Labs  Lab 02/02/24 1448  AST 23  ALT 17  ALKPHOS 96  BILITOT 0.8  PROT 8.0  ALBUMIN 3.0*   No results for input(s): LIPASE, AMYLASE in the last 168 hours. No results for input(s): AMMONIA in the last 168 hours. Coagulation Profile: No results for input(s): INR, PROTIME in the last 168 hours. Cardiac Enzymes: No  results for input(s): CKTOTAL, CKMB, CKMBINDEX, TROPONINI, TROPONINIHS in the last 168 hours. BNP (last 3 results) No results for input(s): BNP in the last 8760 hours. HbA1C: No results for input(s): HGBA1C in the last 72 hours. CBG: No results for input(s): GLUCAP in the last 168 hours. Lipid Profile: Recent Labs    02/02/24 1448  CHOL 121  HDL 58  LDLCALC 52  TRIG 53  CHOLHDL 2.1   Thyroid Function Tests: No results for input(s): TSH, T4TOTAL, FREET4, T3FREE, THYROIDAB in the last 72 hours. Anemia Panel: No results for input(s): VITAMINB12, FOLATE, FERRITIN, TIBC, IRON, RETICCTPCT in the last 72 hours. Urine analysis: No results found for: COLORURINE, APPEARANCEUR, LABSPEC, PHURINE, GLUCOSEU, HGBUR, BILIRUBINUR, KETONESUR, PROTEINUR, UROBILINOGEN, NITRITE, LEUKOCYTESUR  Radiological Exams on Admission: I have personally reviewed images DG Ankle Complete Right Result Date: 02/02/2024 CLINICAL DATA:  Infection, ?osteo EXAM: DG ANKLE COMPLETE 3+V*R* COMPARISON:  June 01, 2022 FINDINGS: Redemonstrated anterior approach cortical screws in the distal fibula. Likely postsurgical changes along the lateral cortex of the distal fibula.No acute fracture or dislocation. No ankle mortise widening. The talar dome is intact. Small undersurface calcaneal heel spur with an Achilles insertion enthesophyte. Moderate degenerative spurring along the midfoot. Mild degenerative changes of the subtalar joint. Soft tissue swelling about the distal calf and ankle. IMPRESSION: Soft tissue swelling about the distal calf and ankle. No radiographic findings of osteomyelitis, at this time. Electronically Signed   By: Rogelia Myers M.D.   On: 02/02/2024 17:23    EKG: My personal interpretation of EKG shows: Pending    Assessment/Plan Principal Problem:   Cellulitis of left lower extremity Active Problems:   Impaired glucose tolerance   Multiple  open wounds of lower extremity   Elevated blood pressure reading   Patient with cellulitis of the right lower extremity.  He has chronic wounds and suspect poor wound care has resulted in acute infection.  He is status post IV ceftriaxone  and vancomycin .  Given that he is having some discharge will continue vancomycin .  Will monitor fever curve, leukocytosis and obtain blood cultures.  He was quite hyperglycemic on CMP and I believe he has undiagnosed diabetes.  Will get A1c.  Wound care consult placed for lower extremity wounds.  Hyperglycemia: Getting A1c.  Rheumatoid arthritis: Will need outpatient workup for this and to see if he needs any pharmacotherapy such as DMARDs.  Unsure if he has rheumatoid arthritis as his anti-CCP was negative.  Patient would benefit from establishing with a PCP and may benefit from seeing rheumatology.  Right lower extremity swelling: Will get Doppler  Elevated blood pressure: CTM.  Patient not on any pharmacotherapy.  Will need outpatient monitoring of  this.   VTE prophylaxis:  Lovenox   Diet: Regular Code Status:  Full Code Telemetry:  Admission status: Inpatient, Med-Surg Patient is from: Home Anticipated d/c is to: Home Anticipated d/c is in: 2-3 days   Family Communication: Updated at bedside  Consults called: None   Severity of Illness: The appropriate patient status for this patient is INPATIENT. Inpatient status is judged to be reasonable and necessary in order to provide the required intensity of service to ensure the patient's safety. The patient's presenting symptoms, physical exam findings, and initial radiographic and laboratory data in the context of their chronic comorbidities is felt to place them at high risk for further clinical deterioration. Furthermore, it is not anticipated that the patient will be medically stable for discharge from the hospital within 2 midnights of admission.   * I certify that at the point of admission it is my  clinical judgment that the patient will require inpatient hospital care spanning beyond 2 midnights from the point of admission due to high intensity of service, high risk for further deterioration and high frequency of surveillance required.DEWAINE Morene Bathe, MD [place] DEL. [facility]

## 2024-02-02 NOTE — ED Triage Notes (Signed)
 Pt to ED for chronic nonhealing wound to both lower legs since 2 years. Currently bandaged and wrapped. States the wound to RLE is circumferential. States wounds drain a lot. Was going to wound care clinic but not anymore. Pt also has excoriation and rash to both arms since 2-3 days. Both hands appear very dry. Denies hx DM, neuropathy. Pt is current smoker.

## 2024-02-02 NOTE — ED Notes (Signed)
 NURSE BRANDON INFORMED OF BED ASSIGNED

## 2024-02-02 NOTE — ED Notes (Signed)
 Sent 1 set cultures

## 2024-02-03 ENCOUNTER — Inpatient Hospital Stay: Payer: Self-pay

## 2024-02-03 DIAGNOSIS — L03115 Cellulitis of right lower limb: Secondary | ICD-10-CM

## 2024-02-03 LAB — CBC
HCT: 36.2 % — ABNORMAL LOW (ref 39.0–52.0)
Hemoglobin: 11.7 g/dL — ABNORMAL LOW (ref 13.0–17.0)
MCH: 28.3 pg (ref 26.0–34.0)
MCHC: 32.3 g/dL (ref 30.0–36.0)
MCV: 87.7 fL (ref 80.0–100.0)
Platelets: 263 K/uL (ref 150–400)
RBC: 4.13 MIL/uL — ABNORMAL LOW (ref 4.22–5.81)
RDW: 13.2 % (ref 11.5–15.5)
WBC: 8.2 K/uL (ref 4.0–10.5)
nRBC: 0 % (ref 0.0–0.2)

## 2024-02-03 LAB — GLUCOSE, CAPILLARY
Glucose-Capillary: 117 mg/dL — ABNORMAL HIGH (ref 70–99)
Glucose-Capillary: 210 mg/dL — ABNORMAL HIGH (ref 70–99)
Glucose-Capillary: 110 mg/dL — ABNORMAL HIGH (ref 70–99)
Glucose-Capillary: 108 mg/dL — ABNORMAL HIGH (ref 70–99)

## 2024-02-03 LAB — BASIC METABOLIC PANEL WITH GFR
Anion gap: 8 (ref 5–15)
BUN: 12 mg/dL (ref 8–23)
CO2: 25 mmol/L (ref 22–32)
Calcium: 7.9 mg/dL — ABNORMAL LOW (ref 8.9–10.3)
Chloride: 101 mmol/L (ref 98–111)
Creatinine, Ser: 0.69 mg/dL (ref 0.61–1.24)
GFR, Estimated: >60 mL/min (ref >60)
Glucose, Bld: 117 mg/dL — ABNORMAL HIGH (ref 70–99)
Potassium: 3.9 mmol/L (ref 3.5–5.1)
Sodium: 134 mmol/L — ABNORMAL LOW (ref 135–145)

## 2024-02-03 LAB — HEMOGLOBIN A1C
Hgb A1c MFr Bld: 5.7 % — ABNORMAL HIGH (ref 4.8–5.6)
Mean Plasma Glucose: 116.89 mg/dL

## 2024-02-03 LAB — HIV ANTIBODY (ROUTINE TESTING W REFLEX): HIV Screen 4th Generation wRfx: NONREACTIVE

## 2024-02-03 MED ORDER — INSULIN ASPART 100 UNIT/ML IJ SOLN: 0.0000 [IU] | Freq: Every day | INTRAMUSCULAR | Status: DC

## 2024-02-03 MED ORDER — VANCOMYCIN HCL 750 MG/150ML IV SOLN
750.0000 mg | Freq: Once | INTRAVENOUS | Status: AC
  Administered 2024-02-03: 750 mg via INTRAVENOUS
  Filled 2024-02-03: qty 150

## 2024-02-03 MED ORDER — VANCOMYCIN HCL 1250 MG/250ML IV SOLN
1250.0000 mg | Freq: Two times a day (BID) | INTRAVENOUS | Status: DC
  Administered 2024-02-03 – 2024-02-05 (×5): 1250 mg via INTRAVENOUS
  Filled 2024-02-03 (×6): qty 250

## 2024-02-03 MED ORDER — SODIUM CHLORIDE 0.9 % IV SOLN: INTRAVENOUS | Status: AC | PRN

## 2024-02-03 MED ORDER — INSULIN ASPART 100 UNIT/ML IJ SOLN
0.0000 [IU] | Freq: Three times a day (TID) | INTRAMUSCULAR | Status: DC
  Administered 2024-02-03: 3 [IU] via SUBCUTANEOUS
  Administered 2024-02-04 – 2024-02-05 (×3): 1 [IU] via SUBCUTANEOUS
  Administered 2024-02-05: 2 [IU] via SUBCUTANEOUS
  Filled 2024-02-03 (×5): qty 1

## 2024-02-03 NOTE — Plan of Care (Signed)

## 2024-02-03 NOTE — Progress Notes (Signed)
 Pharmacy Antibiotic Note  [name] is a 63 y.o. male admitted on 02/02/2024 with cellulitis.  Pharmacy has been consulted for Vancomycin  dosing.  Plan: Vancomycin  1 gm IV X 1 given in ED on 11/04 @ 1806.  Additional Vanc 750 mg IV X 1 ordered to make total loading dose of 1750 mg.  Vancomycin  1250 mg IV Q12H ordered to start on 11/05 @ 0600.  AUC = 513.9 Vanc trough = 14.0   Height: 5' 9 (175.3 cm) Weight: 79.6 kg (175 lb 7.8 oz) IBW/kg (Calculated) : 70.7  Temp (24hrs), Avg:98 F (36.7 C), Min:97.5 F (36.4 C), Max:98.4 F (36.9 C)  Recent Labs  Lab 02/02/24 1448  WBC 7.4  CREATININE 0.74  LATICACIDVEN 1.5    Estimated Creatinine Clearance: 97 mL/min (by C-G formula based on SCr of 0.74 mg/dL).    No Known Allergies  Antimicrobials this admission:   >>    >>   Dose adjustments this admission:   Microbiology results:  BCx:   UCx:    Sputum:    MRSA PCR:   Thank you for allowing pharmacy to be a part of this patient's care.  Elisea Khader D 02/03/2024 12:46 AM

## 2024-02-03 NOTE — Progress Notes (Signed)
 Progress Note   Patient: [name] FMW:[id number] DOB: 1960/09/28 DOA: 02/02/2024     1 DOS: the patient was seen and examined on 02/03/2024   Brief hospital course:  From HPI Asa Baudoin is a 63 y.o. year old male with past medical history of rheumatoid arthritis, type 2 diabetes presenting to the ED for worsening of his chronic wounds.     Pt states over the last 4 to 5 days he has been having worsening right lower extremity wound.  These wounds been having a problem for him for about 2 years but they are getting better so he stopped following with wound clinic and his PCP as there were only changing his bandages which he states he could do himself.  He states the right leg has been having some oozing which is clear but is very tender to touch and has become quite red over the last few days.  He denies any fevers or chills.     On arrival to the ED patient was noted to be HDS stable.  Lab work and imaging CBC without leukocytosis, mild anemia that is stable.  CMP with significant hyperglycemia at 177 and mild hypocalcemia 8.3.  Lactic acid checked and normal.  Right lower extremity x-ray obtained that did not show osteomyelitis but did show soft tissue edema as can be seen in cellulitis.  EDP started patient on ceftriaxone  and vancomycin .  Given need for continued care, [facility] contacted for admission.      Assessment and Plan:  Cellulitis of left lower extremity Continue antibiotics Follow-up on culture results Continue lower extremity elevation Wound consult  Diabetes mellitus type 2 Follow-up on A1c Continue insulin  therapy   Rheumatoid arthritis:  Outpatient follow-up   Right lower extremity swelling:  Doppler did not show DVT   Elevated blood pressure: CTM.  Patient not on any pharmacotherapy.  Will need outpatient monitoring of this.    VTE prophylaxis:  Lovenox   Diet: Diabetic diet  Subjective:  Patient seen and examined at bedside this morning He admits to improvement  in the leg wounds Denies nausea vomiting abdominal pain chest pain cough  Physical Exam:  Gen: NAD HENT: NCAT CV: Regular rate and rhythm, good pulses in all extremities including bilateral feet Resp: CTAB Abd: No TTP, normal bowel sounds MSK: No symmetry, bilateral lower extremities with wounds with some acuteness of the right lower extremity.  See media tab for images Neuro: Alert and oriented x 4 Psych: Pleasant mood   Vitals:   02/03/24 0500 02/03/24 0756 02/03/24 1157 02/03/24 1541  BP:  108/67 119/69 136/79  Pulse:  88 83 99  Resp:  18 20 18   Temp:  99.4 F (37.4 C) 100 F (37.8 C) (!) 100.8 F (38.2 C)  TempSrc:  Oral    SpO2:  97% 96% 98%  Weight: 80.1 kg     Height:        Data Reviewed:    Latest Ref Rng & Units 02/03/2024    4:42 AM 02/02/2024    2:48 PM 06/03/2022    3:30 AM  CBC  WBC 4.0 - 10.5 K/uL 8.2  7.4  5.3   Hemoglobin 13.0 - 17.0 g/dL 88.2  87.3  87.7   Hematocrit 39.0 - 52.0 % 36.2  38.4  38.3   Platelets 150 - 400 K/uL 263  285  278        Latest Ref Rng & Units 02/03/2024    4:42 AM 02/02/2024    2:48  PM 06/03/2022    3:30 AM  BMP  Glucose 70 - 99 mg/dL 882  822  864   BUN 8 - 23 mg/dL 12  14  19    Creatinine 0.61 - 1.24 mg/dL 9.30  9.25  9.24   Sodium 135 - 145 mmol/L 134  137  133   Potassium 3.5 - 5.1 mmol/L 3.9  3.5  3.9   Chloride 98 - 111 mmol/L 101  99  101   CO2 22 - 32 mmol/L 25  28  27    Calcium 8.9 - 10.3 mg/dL 7.9  8.3  8.7      Family Communication: None at the bedside  Disposition: Status is: Inpatient   Planned Discharge Destination:   Time spent: 52 minutes  Author: Drue ONEIDA Potter, MD 02/03/2024 4:54 PM  For on call review [email or web address].

## 2024-02-03 NOTE — Plan of Care (Signed)
  Problem: Clinical Measurements: Goal: Respiratory complications will improve Outcome: Not Progressing Goal: Cardiovascular complication will be avoided Outcome: Progressing   Problem: Activity: Goal: Risk for activity intolerance will decrease Outcome: Progressing   Problem: Pain Managment: Goal: General experience of comfort will improve and/or be controlled Outcome: Progressing

## 2024-02-03 NOTE — Consult Note (Addendum)
 WOC Nurse Consult Note: Reason for Consult: Consult requested for bilat legs.   Pt has cellulitis which is being treated with systemic antibiotics.  Left posterior calf with full thickness wound; 9X7X.2cm, 50% yellow slough, 50% red, large amt tan drainage with some odor. Painful to touch. No edema or erythremia surrounding the location.  Right anterior and posterior calf with full thickness skin loss around entire calf; affected area approx 15X30X.2cm, red, moist and weeping large amt tan drainage, painful to touch, generalized edema and erythremia surrounding the location.       Topical treatment orders provided for bedside nurses to perform to absorb drainage and promote drying and healing: Apply dressings to legs Q M/W/F as follows: 1. Moisten previous dressings with NS to assist with removal each time.  2. Left leg, cut piece of Calcium alginate Soila # [phone]) to left posterior leg wound, then cover with ABD pad and kerlex 3. Right leg, cover with Xeroform gauze and then ABD pads and kerlex.  Pt states he has performed dressing changes in the past and feels he could do it again after discharge.   Pt could benefit from follow-up at the outpatient wound care center; he has been seen there in the past.  This must be by physician referral and can be arranged by the case manager; primary team; please order if desired.   Please re-consult if further assistance is needed.  Thank-you,  Stephane Fought MSN, RN, CWOCN, CWCN-AP, CNS Contact Mon-Fri 0700-1500: [phone]

## 2024-02-04 LAB — GLUCOSE, CAPILLARY
Glucose-Capillary: 112 mg/dL — ABNORMAL HIGH (ref 70–99)
Glucose-Capillary: 131 mg/dL — ABNORMAL HIGH (ref 70–99)
Glucose-Capillary: 145 mg/dL — ABNORMAL HIGH (ref 70–99)
Glucose-Capillary: 142 mg/dL — ABNORMAL HIGH (ref 70–99)

## 2024-02-04 LAB — CREATININE, SERUM
Creatinine, Ser: 0.72 mg/dL (ref 0.61–1.24)
GFR, Estimated: >60 mL/min (ref >60)

## 2024-02-04 MED ORDER — DIPHENHYDRAMINE HCL 25 MG PO CAPS
25.0000 mg | ORAL_CAPSULE | Freq: Four times a day (QID) | ORAL | Status: DC | PRN
  Administered 2024-02-04 (×2): 25 mg via ORAL
  Filled 2024-02-04 (×2): qty 1

## 2024-02-04 NOTE — Progress Notes (Signed)
 Progress Note   Patient: [name] FMW:[id number] DOB: October 26, 1960 DOA: 02/02/2024     2 DOS: the patient was seen and examined on 02/04/2024     Brief hospital course:  From HPI [name] is a 63 y.o. year old male with past medical history of rheumatoid arthritis, type 2 diabetes presenting to the ED for worsening of his chronic wounds.     Pt states over the last 4 to 5 days he has been having worsening right lower extremity wound.  These wounds been having a problem for him for about 2 years but they are getting better so he stopped following with wound clinic and his PCP as there were only changing his bandages which he states he could do himself.  He states the right leg has been having some oozing which is clear but is very tender to touch and has become quite red over the last few days.  He denies any fevers or chills.     On arrival to the ED patient was noted to be HDS stable.  Lab work and imaging CBC without leukocytosis, mild anemia that is stable.  CMP with significant hyperglycemia at 177 and mild hypocalcemia 8.3.  Lactic acid checked and normal.  Right lower extremity x-ray obtained that did not show osteomyelitis but did show soft tissue edema as can be seen in cellulitis.  EDP started patient on ceftriaxone  and vancomycin .  Given need for continued care, [facility] contacted for admission.       Assessment and Plan:   Cellulitis of left lower extremity Continue current antibiotics Follow-up on culture results Continue lower extremity elevation Wound consult   Diabetes mellitus type 2 Follow-up on A1c Continue insulin  therapy   Rheumatoid arthritis:  Outpatient follow-up   Right lower extremity swelling:  Doppler did not show DVT   Elevated blood pressure: CTM.  Patient not on any pharmacotherapy.  Will need outpatient monitoring of this.    VTE prophylaxis:  Lovenox   Diet: Diabetic diet   Subjective:  Patient seen and examined at bedside this morning Leg  swelling improved No acute overnight event   Physical Exam:   Gen: NAD HENT: NCAT CV: Regular rate and rhythm, good pulses in all extremities including bilateral feet Resp: CTAB Abd: No TTP, normal bowel sounds MSK: No symmetry, bilateral lower extremities with wounds with some acuteness of the right lower extremity.  See media tab for images Neuro: Alert and oriented x 4 Psych: Pleasant mood  Vitals:   02/03/24 1957 02/04/24 0330 02/04/24 0728 02/04/24 1507  BP: 120/69 130/82 124/70 128/75  Pulse: 75 77 95 80  Resp: 18 17 17 18   Temp: 98.8 F (37.1 C) 98.9 F (37.2 C) 98.8 F (37.1 C) 99.1 F (37.3 C)  TempSrc: Oral     SpO2: 97% 98% 96% 98%  Weight:  77.8 kg    Height:          Latest Ref Rng & Units 02/03/2024    4:42 AM 02/02/2024    2:48 PM 06/03/2022    3:30 AM  CBC  WBC 4.0 - 10.5 K/uL 8.2  7.4  5.3   Hemoglobin 13.0 - 17.0 g/dL 88.2  87.3  87.7   Hematocrit 39.0 - 52.0 % 36.2  38.4  38.3   Platelets 150 - 400 K/uL 263  285  278        Latest Ref Rng & Units 02/04/2024    7:37 AM 02/03/2024    4:42 AM 02/02/2024  2:48 PM  BMP  Glucose 70 - 99 mg/dL  882  822   BUN 8 - 23 mg/dL  12  14   Creatinine 9.38 - 1.24 mg/dL 9.27  9.30  9.25   Sodium 135 - 145 mmol/L  134  137   Potassium 3.5 - 5.1 mmol/L  3.9  3.5   Chloride 98 - 111 mmol/L  101  99   CO2 22 - 32 mmol/L  25  28   Calcium 8.9 - 10.3 mg/dL  7.9  8.3      Author: Drue ONEIDA Potter, MD 02/04/2024 6:42 PM  For on call review [email or web address]

## 2024-02-04 NOTE — Plan of Care (Signed)

## 2024-02-04 NOTE — Plan of Care (Signed)
  Problem: Education: Goal: Knowledge of General Education information will improve Description: Including pain rating scale, medication(s)/side effects and non-pharmacologic comfort measures Outcome: Progressing   Problem: Clinical Measurements: Goal: Ability to maintain clinical measurements within normal limits will improve Outcome: Progressing   Problem: Clinical Measurements: Goal: Will remain free from infection Outcome: Progressing   Problem: Coping: Goal: Level of anxiety will decrease Outcome: Progressing   Problem: Skin Integrity: Goal: Risk for impaired skin integrity will decrease Outcome: Progressing   Problem: Metabolic: Goal: Ability to maintain appropriate glucose levels will improve Outcome: Progressing   Problem: Tissue Perfusion: Goal: Adequacy of tissue perfusion will improve Outcome: Progressing

## 2024-02-04 NOTE — Plan of Care (Signed)
  Problem: Education: Goal: Knowledge of General Education information will improve Description: Including pain rating scale, medication(s)/side effects and non-pharmacologic comfort measures Outcome: Progressing   Problem: Clinical Measurements: Goal: Will remain free from infection Outcome: Progressing   Problem: Clinical Measurements: Goal: Respiratory complications will improve Outcome: Progressing   Problem: Clinical Measurements: Goal: Cardiovascular complication will be avoided Outcome: Progressing   Problem: Activity: Goal: Risk for activity intolerance will decrease Outcome: Progressing   Problem: Pain Managment: Goal: General experience of comfort will improve and/or be controlled Outcome: Progressing   Problem: Safety: Goal: Ability to remain free from injury will improve Outcome: Progressing

## 2024-02-05 LAB — BASIC METABOLIC PANEL WITH GFR
Anion gap: 8 (ref 5–15)
BUN: 14 mg/dL (ref 8–23)
CO2: 26 mmol/L (ref 22–32)
Calcium: 8.1 mg/dL — ABNORMAL LOW (ref 8.9–10.3)
Chloride: 101 mmol/L (ref 98–111)
Creatinine, Ser: 0.82 mg/dL (ref 0.61–1.24)
GFR, Estimated: >60 mL/min (ref >60)
Glucose, Bld: 124 mg/dL — ABNORMAL HIGH (ref 70–99)
Potassium: 3.9 mmol/L (ref 3.5–5.1)
Sodium: 135 mmol/L (ref 135–145)

## 2024-02-05 LAB — CBC WITH DIFFERENTIAL/PLATELET
Abs Immature Granulocytes: 0.02 K/uL (ref 0.00–0.07)
Basophils Absolute: 0 K/uL (ref 0.0–0.1)
Basophils Relative: 0 %
Eosinophils Absolute: 0.6 K/uL — ABNORMAL HIGH (ref 0.0–0.5)
Eosinophils Relative: 8 %
HCT: 36.9 % — ABNORMAL LOW (ref 39.0–52.0)
Hemoglobin: 12.1 g/dL — ABNORMAL LOW (ref 13.0–17.0)
Immature Granulocytes: 0 %
Lymphocytes Relative: 16 %
Lymphs Abs: 1.1 K/uL (ref 0.7–4.0)
MCH: 28.4 pg (ref 26.0–34.0)
MCHC: 32.8 g/dL (ref 30.0–36.0)
MCV: 86.6 fL (ref 80.0–100.0)
Monocytes Absolute: 0.8 K/uL (ref 0.1–1.0)
Monocytes Relative: 12 %
Neutro Abs: 4.4 K/uL (ref 1.7–7.7)
Neutrophils Relative %: 64 %
Platelets: 297 K/uL (ref 150–400)
RBC: 4.26 MIL/uL (ref 4.22–5.81)
RDW: 13 % (ref 11.5–15.5)
WBC: 7 K/uL (ref 4.0–10.5)
nRBC: 0 % (ref 0.0–0.2)

## 2024-02-05 LAB — GLUCOSE, CAPILLARY
Glucose-Capillary: 137 mg/dL — ABNORMAL HIGH (ref 70–99)
Glucose-Capillary: 174 mg/dL — ABNORMAL HIGH (ref 70–99)

## 2024-02-05 MED ORDER — DIPHENHYDRAMINE HCL 25 MG PO CAPS: 25.0000 mg | ORAL_CAPSULE | Freq: Four times a day (QID) | ORAL | 0 refills | Status: AC | PRN

## 2024-02-05 MED ORDER — ACETAMINOPHEN 325 MG PO TABS: 650.0000 mg | ORAL_TABLET | Freq: Four times a day (QID) | ORAL | 0 refills | Status: AC | PRN

## 2024-02-05 MED ORDER — METFORMIN HCL 500 MG PO TABS: 500.0000 mg | ORAL_TABLET | Freq: Two times a day (BID) | ORAL | 1 refills | Status: AC

## 2024-02-05 MED ORDER — AMOXICILLIN-POT CLAVULANATE 875-125 MG PO TABS: 1.0000 | ORAL_TABLET | Freq: Two times a day (BID) | ORAL | 0 refills | Status: AC

## 2024-02-05 NOTE — Discharge Summary (Addendum)
 Physician Discharge Summary   Patient: [name] MRN: [id number] DOB: 16-May-1960  Admit date:     02/02/2024  Discharge date: 02/05/24  Discharge Physician: Drue ONEIDA Potter   PCP: Pcp, No   Recommendations at discharge:  Follow-up with wound care clinic as well as PCP   Discharge Diagnoses: Principal Problem:   Cellulitis of right lower extremity Active Problems:   Impaired glucose tolerance   Multiple open wounds of lower extremity   Elevated blood pressure reading  Resolved Problems:   * No resolved hospital problems. *  Hospital Course: [name] is a 63 y.o. year old male with past medical history of rheumatoid arthritis, type 2 diabetes presenting to the ED for worsening of his chronic wounds. Pt states over the last 4 to 5 days before presentation he has been having worsening right lower extremity wound.  These wounds been having a problem for him for about 2 years but they are getting better so he stopped following with wound clinic and his PCP as there were only changing his bandages which he states he could do himself.  He states the right leg has been having some oozing which is clear but is very tender to touch and has become quite red and so he decided to come in for further evaluation. On arrival to the ED patient was noted to be hemodynamically stable.  Lab work and imaging CBC without leukocytosis, mild anemia that is stable.  CMP with significant hyperglycemia at 177 and mild hypocalcemia 8.3.  Lactic acid checked and normal.  Right lower extremity x-ray obtained that did not show osteomyelitis but did show soft tissue edema as can be seen in cellulitis.  Patient was started on antibiotic therapy and [facility] was contacted for admission.   Wound care instructions  Apply dressings to legs Q M/W/F as follows: 1. Moisten previous dressings with NS to assist with removal each time.  2. Left leg, cut piece of Calcium alginate to left posterior leg wound, then cover with ABD pad  and kerlex 3. Right leg, cover with Xeroform gauze and then ABD pads and kerlex.   Pt states he has performed dressing changes in the past and feels he could do it again after discharge.    Assessment and Plan:   Cellulitis of left lower extremity Patient received vancomycin  with improvement in lower extremity swelling and redness Was also seen by wound care Patient has been cleared for discharge today and will follow-up with outpatient wound care Continue oral antibiotics at discharge  Diabetes mellitus type 2 Continue on home medication   Rheumatoid arthritis:  Outpatient follow-up   Right lower extremity swelling:  Doppler did not show DVT   Elevated blood pressure: CTM.  Patient not on any pharmacotherapy.  Will need outpatient monitoring of this.     Consultants: Wound care Procedures performed: None Disposition: Home Diet recommendation:  Carb modified diet DISCHARGE MEDICATION: Allergies as of 02/05/2024   No Known Allergies      Medication List     STOP taking these medications    sulfamethoxazole-trimethoprim 800-160 MG tablet Commonly known as: BACTRIM DS       TAKE these medications    acetaminophen  325 MG tablet Commonly known as: TYLENOL  Take 2 tablets (650 mg total) by mouth every 6 (six) hours as needed for mild pain (pain score 1-3) or fever (or Fever >/= 101).   amoxicillin-clavulanate 875-125 MG tablet Commonly known as: AUGMENTIN Take 1 tablet by mouth 2 (two) times  daily for 7 days.   diphenhydrAMINE 25 mg capsule Commonly known as: BENADRYL Take 1 capsule (25 mg total) by mouth every 6 (six) hours as needed for itching.   metFORMIN 500 MG tablet Commonly known as: GLUCOPHAGE Take 1 tablet (500 mg total) by mouth 2 (two) times daily with a meal.        Discharge Exam: Filed Weights   02/03/24 0500 02/04/24 0330 02/05/24 0419  Weight: 80.1 kg 77.8 kg 76.8 kg   Gen: NAD HENT: NCAT CV: Regular rate and rhythm, good pulses in  all extremities including bilateral feet Resp: CTAB Abd: No TTP, normal bowel sounds MSK: No symmetry, bilateral lower extremities with wounds dressing is clean and dry Neuro: Alert and oriented x 4 Psych: Pleasant mood    Condition at discharge: good  The results of significant diagnostics from this hospitalization (including imaging, microbiology, ancillary and laboratory) are listed below for reference.   Imaging Studies: US  Venous Img Lower Unilateral Right (DVT) Result Date: 02/03/2024 EXAM: ULTRASOUND DUPLEX OF THE RIGHT LOWER EXTREMITY VEINS TECHNIQUE: Duplex ultrasound using B-mode/gray scaled imaging and Doppler spectral analysis and color flow was obtained of the deep venous structures of the right lower extremity. COMPARISON: Contralateral 05/31/2022. CLINICAL HISTORY: Swelling. FINDINGS: The common femoral vein, femoral vein, popliteal vein, and posterior tibial vein of the right lower extremity demonstrate normal compressibility with normal color flow and spectral analysis. Limited images of the contralateral left common femoral vein are normal. Bilateral prominent inguinal lymph nodes measuring up to 1.5 cm short axis diameter left, 1.2 cm right as before. IMPRESSION: 1. No evidence of DVT. Electronically signed by: Katheleen Faes MD 02/03/2024 08:39 AM EST RP Workstation: [id number]   DG Ankle Complete Right Result Date: 02/02/2024 CLINICAL DATA:  Infection, ?osteo EXAM: DG ANKLE COMPLETE 3+V*R* COMPARISON:  June 01, 2022 FINDINGS: Redemonstrated anterior approach cortical screws in the distal fibula. Likely postsurgical changes along the lateral cortex of the distal fibula.No acute fracture or dislocation. No ankle mortise widening. The talar dome is intact. Small undersurface calcaneal heel spur with an Achilles insertion enthesophyte. Moderate degenerative spurring along the midfoot. Mild degenerative changes of the subtalar joint. Soft tissue swelling about the distal calf and  ankle. IMPRESSION: Soft tissue swelling about the distal calf and ankle. No radiographic findings of osteomyelitis, at this time. Electronically Signed   By: Rogelia Myers M.D.   On: 02/02/2024 17:23    Microbiology: Results for orders placed or performed during the hospital encounter of 06/19/22  Aerobic Culture w Gram Stain (superficial specimen)     Status: None   Collection Time: 06/19/22 11:27 AM   Specimen: Wound  Result Value Ref Range Status   Specimen Description   Final    WOUND Performed at [facility], [address], [place], [place] [address]    Special Requests   Final    LEFT LOWER LEG Performed at [facility], [address] [place], [place] [address]    Gram Stain   Final    NO WBC SEEN RARE GRAM POSITIVE COCCI Performed at [facility], [address], [place], [place] [address]    Culture   Final    MODERATE PSEUDOMONAS AERUGINOSA FEW STAPHYLOCOCCUS AUREUS    Report Status 06/22/2022 FINAL  Final   Organism ID, Bacteria PSEUDOMONAS AERUGINOSA  Final   Organism ID, Bacteria STAPHYLOCOCCUS AUREUS  Final      Susceptibility   Pseudomonas aeruginosa - MIC*    CEFTAZIDIME  2 SENSITIVE Sensitive     CIPROFLOXACIN <=0.25 SENSITIVE Sensitive     GENTAMICIN <=1 SENSITIVE Sensitive     IMIPENEM <=0.25 SENSITIVE Sensitive     PIP/TAZO <=4 SENSITIVE Sensitive     CEFEPIME  2 SENSITIVE Sensitive     * MODERATE PSEUDOMONAS AERUGINOSA   Staphylococcus aureus - MIC*    CIPROFLOXACIN <=0.5 SENSITIVE Sensitive     ERYTHROMYCIN >=8 RESISTANT Resistant     GENTAMICIN <=0.5 SENSITIVE Sensitive     OXACILLIN <=0.25 SENSITIVE Sensitive     TETRACYCLINE >=16 RESISTANT Resistant     VANCOMYCIN  1 SENSITIVE Sensitive     TRIMETH/SULFA <=10 SENSITIVE Sensitive     CLINDAMYCIN <=0.25 SENSITIVE Sensitive     RIFAMPIN <=0.5 SENSITIVE Sensitive     Inducible Clindamycin NEGATIVE Sensitive     * FEW STAPHYLOCOCCUS AUREUS    Labs: CBC: Recent Labs   Lab 02/02/24 1448 02/03/24 0442 02/05/24 0533  WBC 7.4 8.2 7.0  NEUTROABS 5.5  --  4.4  HGB 12.6* 11.7* 12.1*  HCT 38.4* 36.2* 36.9*  MCV 88.3 87.7 86.6  PLT 285 263 297   Basic Metabolic Panel: Recent Labs  Lab 02/02/24 1448 02/03/24 0442 02/04/24 0737 02/05/24 0533  NA 137 134*  --  135  K 3.5 3.9  --  3.9  CL 99 101  --  101  CO2 28 25  --  26  GLUCOSE 177* 117*  --  124*  BUN 14 12  --  14  CREATININE 0.74 0.69 0.72 0.82  CALCIUM 8.3* 7.9*  --  8.1*  MG 2.1  --   --   --    Liver Function Tests: Recent Labs  Lab 02/02/24 1448  AST 23  ALT 17  ALKPHOS 96  BILITOT 0.8  PROT 8.0  ALBUMIN 3.0*   CBG: Recent Labs  Lab 02/04/24 1138 02/04/24 1610 02/04/24 1943 02/05/24 0742 02/05/24 1139  GLUCAP 131* 145* 142* 137* 174*    Discharge time spent:  36 minutes.  Signed: Drue ONEIDA Potter, MD [facility] 02/05/2024

## 2024-02-05 NOTE — Progress Notes (Signed)
 Pt bilateral dressing changed completed. Pt tolerated well.

## 2024-02-05 NOTE — Plan of Care (Signed)

## 2024-02-05 NOTE — Plan of Care (Signed)
 Patient A&Ox4, VSS on RA. Discharge instructions discussed with patient. Emphasis on follow up in wound care clinic. And completing antibiotics. Patient instructed where to pick up medications. Wound care supplies with patient. Wife to come and pick up patient.   Problem: Education: Goal: Knowledge of General Education information will improve Description: Including pain rating scale, medication(s)/side effects and non-pharmacologic comfort measures 02/05/2024 1604 by Teressa Nest, RN Outcome: Completed/Met 02/05/2024 1407 by Teressa Nest, RN Outcome: Progressing   Problem: Health Behavior/Discharge Planning: Goal: Ability to manage health-related needs will improve 02/05/2024 1604 by Teressa Nest, RN Outcome: Completed/Met 02/05/2024 1407 by Teressa Nest, RN Outcome: Progressing   Problem: Clinical Measurements: Goal: Ability to maintain clinical measurements within normal limits will improve 02/05/2024 1604 by Teressa Nest, RN Outcome: Completed/Met 02/05/2024 1407 by Teressa Nest, RN Outcome: Progressing Goal: Will remain free from infection 02/05/2024 1604 by Teressa Nest, RN Outcome: Completed/Met 02/05/2024 1407 by Teressa Nest, RN Outcome: Progressing Goal: Diagnostic test results will improve 02/05/2024 1604 by Teressa Nest, RN Outcome: Completed/Met 02/05/2024 1407 by Teressa Nest, RN Outcome: Progressing Goal: Respiratory complications will improve 02/05/2024 1604 by Teressa Nest, RN Outcome: Completed/Met 02/05/2024 1407 by Teressa Nest, RN Outcome: Progressing Goal: Cardiovascular complication will be avoided 02/05/2024 1604 by Teressa Nest, RN Outcome: Completed/Met 02/05/2024 1407 by Teressa Nest, RN Outcome: Progressing   Problem: Activity: Goal: Risk for activity intolerance will decrease 02/05/2024 1604 by Teressa Nest, RN Outcome: Completed/Met 02/05/2024 1407 by Teressa Nest, RN Outcome: Progressing    Problem: Nutrition: Goal: Adequate nutrition will be maintained 02/05/2024 1604 by Teressa Nest, RN Outcome: Completed/Met 02/05/2024 1407 by Teressa Nest, RN Outcome: Progressing   Problem: Coping: Goal: Level of anxiety will decrease 02/05/2024 1604 by Teressa Nest, RN Outcome: Completed/Met 02/05/2024 1407 by Teressa Nest, RN Outcome: Progressing   Problem: Elimination: Goal: Will not experience complications related to bowel motility 02/05/2024 1604 by Teressa Nest, RN Outcome: Completed/Met 02/05/2024 1407 by Teressa Nest, RN Outcome: Progressing Goal: Will not experience complications related to urinary retention 02/05/2024 1604 by Teressa Nest, RN Outcome: Completed/Met 02/05/2024 1407 by Teressa Nest, RN Outcome: Progressing   Problem: Pain Managment: Goal: General experience of comfort will improve and/or be controlled 02/05/2024 1604 by Teressa Nest, RN Outcome: Completed/Met 02/05/2024 1407 by Teressa Nest, RN Outcome: Progressing   Problem: Safety: Goal: Ability to remain free from injury will improve 02/05/2024 1604 by Teressa Nest, RN Outcome: Completed/Met 02/05/2024 1407 by Teressa Nest, RN Outcome: Progressing   Problem: Skin Integrity: Goal: Risk for impaired skin integrity will decrease 02/05/2024 1604 by Teressa Nest, RN Outcome: Completed/Met 02/05/2024 1407 by Teressa Nest, RN Outcome: Progressing   Problem: Clinical Measurements: Goal: Ability to avoid or minimize complications of infection will improve 02/05/2024 1604 by Teressa Nest, RN Outcome: Completed/Met 02/05/2024 1407 by Teressa Nest, RN Outcome: Progressing   Problem: Skin Integrity: Goal: Skin integrity will improve 02/05/2024 1604 by Teressa Nest, RN Outcome: Completed/Met 02/05/2024 1407 by Teressa Nest, RN Outcome: Progressing   Problem: Education: Goal: Ability to describe self-care measures that may prevent or  decrease complications (Diabetes Survival Skills Education) will improve 02/05/2024 1604 by Teressa Nest, RN Outcome: Completed/Met 02/05/2024 1407 by Teressa Nest, RN Outcome: Progressing Goal: Individualized Educational Video(s) 02/05/2024 1604 by Teressa Nest, RN Outcome: Completed/Met 02/05/2024 1407 by Teressa Nest, RN Outcome: Progressing   Problem: Coping: Goal: Ability to adjust to condition or change in health will improve 02/05/2024 1604 by Teressa Nest, RN Outcome: Completed/Met 02/05/2024 1407 by Teressa Nest, RN Outcome: Progressing  Problem: Fluid Volume: Goal: Ability to maintain a balanced intake and output will improve 02/05/2024 1604 by Teressa Nest, RN Outcome: Completed/Met 02/05/2024 1407 by Teressa Nest, RN Outcome: Progressing   Problem: Health Behavior/Discharge Planning: Goal: Ability to identify and utilize available resources and services will improve 02/05/2024 1604 by Teressa Nest, RN Outcome: Completed/Met 02/05/2024 1407 by Teressa Nest, RN Outcome: Progressing Goal: Ability to manage health-related needs will improve 02/05/2024 1604 by Teressa Nest, RN Outcome: Completed/Met 02/05/2024 1407 by Teressa Nest, RN Outcome: Progressing   Problem: Metabolic: Goal: Ability to maintain appropriate glucose levels will improve 02/05/2024 1604 by Teressa Nest, RN Outcome: Completed/Met 02/05/2024 1407 by Teressa Nest, RN Outcome: Progressing   Problem: Nutritional: Goal: Maintenance of adequate nutrition will improve 02/05/2024 1604 by Teressa Nest, RN Outcome: Completed/Met 02/05/2024 1407 by Teressa Nest, RN Outcome: Progressing Goal: Progress toward achieving an optimal weight will improve 02/05/2024 1604 by Teressa Nest, RN Outcome: Completed/Met 02/05/2024 1407 by Teressa Nest, RN Outcome: Progressing   Problem: Skin Integrity: Goal: Risk for impaired skin integrity will  decrease 02/05/2024 1604 by Teressa Nest, RN Outcome: Completed/Met 02/05/2024 1407 by Teressa Nest, RN Outcome: Progressing   Problem: Tissue Perfusion: Goal: Adequacy of tissue perfusion will improve 02/05/2024 1604 by Teressa Nest, RN Outcome: Completed/Met 02/05/2024 1407 by Teressa Nest, RN Outcome: Progressing

## 2024-02-10 ENCOUNTER — Ambulatory Visit: Payer: Self-pay

## 2024-02-10 NOTE — Telephone Encounter (Signed)
  Reason for Disposition  [1] Caller requesting NON-URGENT health information AND [2] PCP's office is the best resource  Answer Assessment - Initial Assessment Questions 1. REASON FOR CALL: What is the main reason for your call? or How can I best help you?  Pt was in the ED on 02/02/24 for chronic cellulitis of lower extremities. States no improvement since discharge. Patient believed he was calling the hospital. Has an appt tomorrow with his PCP at [facility]. This RN recommended he call them to see if they can see him today or return to the ED for further treatment. Pt verbalized understanding.  Protocols used: Information Only Call - No Triage-A-AH   Copied from CRM [id number]. Topic: Clinical - Red Word Triage >> Feb 10, 2024  8:08 AM Marda MATSU wrote: Red Word that prompted transfer to Nurse Triage: bilateral lower extremity (ankle area) Pain, swelling, discharge. Patient feels as if medication is not working.

## 2024-02-11 ENCOUNTER — Ambulatory Visit: Payer: Self-pay | Admitting: Internal Medicine
